# Patient Record
Sex: Male | Born: 1965 | Race: Black or African American | Hispanic: No | Marital: Married | Smoking: Never smoker
Health system: Southern US, Community
[De-identification: ages and names within clinical notes are randomized; demographics above are authoritative.]

## PROBLEM LIST (undated history)

## (undated) DIAGNOSIS — E039 Hypothyroidism, unspecified: Secondary | ICD-10-CM

## (undated) DIAGNOSIS — M545 Low back pain, unspecified: Secondary | ICD-10-CM

## (undated) DIAGNOSIS — T7840XA Allergy, unspecified, initial encounter: Secondary | ICD-10-CM

## (undated) DIAGNOSIS — C73 Malignant neoplasm of thyroid gland: Secondary | ICD-10-CM

## (undated) DIAGNOSIS — H409 Unspecified glaucoma: Secondary | ICD-10-CM

## (undated) DIAGNOSIS — K219 Gastro-esophageal reflux disease without esophagitis: Secondary | ICD-10-CM

## (undated) DIAGNOSIS — F329 Major depressive disorder, single episode, unspecified: Secondary | ICD-10-CM

## (undated) DIAGNOSIS — R339 Retention of urine, unspecified: Secondary | ICD-10-CM

## (undated) DIAGNOSIS — Z86010 Personal history of colonic polyps: Secondary | ICD-10-CM

## (undated) DIAGNOSIS — E785 Hyperlipidemia, unspecified: Secondary | ICD-10-CM

## (undated) DIAGNOSIS — L409 Psoriasis, unspecified: Secondary | ICD-10-CM

## (undated) DIAGNOSIS — K649 Unspecified hemorrhoids: Secondary | ICD-10-CM

## (undated) DIAGNOSIS — F419 Anxiety disorder, unspecified: Secondary | ICD-10-CM

## (undated) DIAGNOSIS — F32A Depression, unspecified: Secondary | ICD-10-CM

## (undated) HISTORY — DX: Hyperlipidemia, unspecified: E78.5

## (undated) HISTORY — DX: Major depressive disorder, single episode, unspecified: F32.9

## (undated) HISTORY — DX: Psoriasis, unspecified: L40.9

## (undated) HISTORY — DX: Malignant neoplasm of thyroid gland: C73

## (undated) HISTORY — DX: Personal history of colonic polyps: Z86.010

## (undated) HISTORY — DX: Retention of urine, unspecified: R33.9

## (undated) HISTORY — DX: Depression, unspecified: F32.A

## (undated) HISTORY — DX: Allergy, unspecified, initial encounter: T78.40XA

## (undated) HISTORY — DX: Unspecified glaucoma: H40.9

## (undated) HISTORY — DX: Gastro-esophageal reflux disease without esophagitis: K21.9

## (undated) HISTORY — DX: Anxiety disorder, unspecified: F41.9

## (undated) HISTORY — DX: Unspecified hemorrhoids: K64.9

## (undated) HISTORY — DX: Low back pain: M54.5

## (undated) HISTORY — DX: Hypothyroidism, unspecified: E03.9

## (undated) HISTORY — DX: Low back pain, unspecified: M54.50

---

## 2004-07-19 ENCOUNTER — Encounter: Admission: RE | Admit: 2004-07-19 | Discharge: 2004-07-19 | Payer: Self-pay | Admitting: Specialist

## 2004-11-26 ENCOUNTER — Emergency Department (HOSPITAL_COMMUNITY): Admission: EM | Admit: 2004-11-26 | Discharge: 2004-11-26 | Payer: Self-pay | Admitting: Emergency Medicine

## 2007-01-05 ENCOUNTER — Ambulatory Visit (HOSPITAL_COMMUNITY): Admission: RE | Admit: 2007-01-05 | Discharge: 2007-01-05 | Payer: Self-pay | Admitting: Chiropractic Medicine

## 2007-07-15 ENCOUNTER — Ambulatory Visit (HOSPITAL_COMMUNITY): Admission: RE | Admit: 2007-07-15 | Discharge: 2007-07-15 | Payer: Self-pay | Admitting: Orthopaedic Surgery

## 2007-07-17 HISTORY — PX: CERVICAL LAMINECTOMY: SHX94

## 2007-07-20 ENCOUNTER — Ambulatory Visit (HOSPITAL_COMMUNITY): Admission: RE | Admit: 2007-07-20 | Discharge: 2007-07-21 | Payer: Self-pay | Admitting: Orthopaedic Surgery

## 2009-03-15 ENCOUNTER — Encounter (HOSPITAL_COMMUNITY): Admission: RE | Admit: 2009-03-15 | Discharge: 2009-06-13 | Payer: Self-pay | Admitting: Internal Medicine

## 2009-03-16 HISTORY — PX: TOTAL THYROIDECTOMY: SHX2547

## 2009-03-23 ENCOUNTER — Encounter: Admission: RE | Admit: 2009-03-23 | Discharge: 2009-03-23 | Payer: Self-pay | Admitting: Internal Medicine

## 2009-04-18 ENCOUNTER — Encounter (INDEPENDENT_AMBULATORY_CARE_PROVIDER_SITE_OTHER): Payer: Self-pay | Admitting: Interventional Radiology

## 2009-04-18 ENCOUNTER — Encounter: Admission: RE | Admit: 2009-04-18 | Discharge: 2009-04-18 | Payer: Self-pay | Admitting: Internal Medicine

## 2009-04-18 ENCOUNTER — Other Ambulatory Visit: Admission: RE | Admit: 2009-04-18 | Discharge: 2009-04-18 | Payer: Self-pay | Admitting: Interventional Radiology

## 2009-06-16 ENCOUNTER — Ambulatory Visit (HOSPITAL_COMMUNITY): Admission: RE | Admit: 2009-06-16 | Discharge: 2009-06-17 | Payer: Self-pay | Admitting: Surgery

## 2009-06-16 ENCOUNTER — Encounter (INDEPENDENT_AMBULATORY_CARE_PROVIDER_SITE_OTHER): Payer: Self-pay | Admitting: Surgery

## 2010-01-05 ENCOUNTER — Encounter: Admission: RE | Admit: 2010-01-05 | Discharge: 2010-01-05 | Payer: Self-pay | Admitting: Internal Medicine

## 2010-07-02 ENCOUNTER — Encounter: Payer: Self-pay | Admitting: Internal Medicine

## 2010-07-02 ENCOUNTER — Encounter: Admission: RE | Admit: 2010-07-02 | Discharge: 2010-07-02 | Payer: Self-pay | Admitting: Internal Medicine

## 2010-07-02 LAB — CONVERTED CEMR LAB: TSH: 1.92 microintl units/mL

## 2010-09-12 ENCOUNTER — Ambulatory Visit: Payer: Self-pay | Admitting: Internal Medicine

## 2010-09-12 ENCOUNTER — Encounter: Payer: Self-pay | Admitting: Internal Medicine

## 2010-09-12 DIAGNOSIS — E039 Hypothyroidism, unspecified: Secondary | ICD-10-CM | POA: Insufficient documentation

## 2010-09-12 DIAGNOSIS — M545 Low back pain: Secondary | ICD-10-CM

## 2010-09-17 ENCOUNTER — Telehealth: Payer: Self-pay | Admitting: Internal Medicine

## 2010-09-17 ENCOUNTER — Ambulatory Visit: Payer: Self-pay | Admitting: Internal Medicine

## 2010-09-17 DIAGNOSIS — R339 Retention of urine, unspecified: Secondary | ICD-10-CM

## 2010-09-17 LAB — CONVERTED CEMR LAB
Bilirubin Urine: NEGATIVE
Hemoglobin, Urine: NEGATIVE
Ketones, ur: NEGATIVE mg/dL
Leukocytes, UA: NEGATIVE
Nitrite: NEGATIVE
Specific Gravity, Urine: 1.02 (ref 1.000–1.030)
Total Protein, Urine: NEGATIVE mg/dL
Urine Glucose: NEGATIVE mg/dL
Urobilinogen, UA: 0.2 (ref 0.0–1.0)
pH: 7 (ref 5.0–8.0)

## 2010-09-20 ENCOUNTER — Ambulatory Visit (HOSPITAL_COMMUNITY): Admission: RE | Admit: 2010-09-20 | Discharge: 2010-09-20 | Payer: Self-pay | Admitting: Internal Medicine

## 2010-09-26 ENCOUNTER — Encounter: Payer: Self-pay | Admitting: Internal Medicine

## 2010-10-01 ENCOUNTER — Encounter: Payer: Self-pay | Admitting: Internal Medicine

## 2010-10-03 ENCOUNTER — Telehealth: Payer: Self-pay | Admitting: Internal Medicine

## 2010-10-03 ENCOUNTER — Encounter (INDEPENDENT_AMBULATORY_CARE_PROVIDER_SITE_OTHER): Payer: Self-pay | Admitting: *Deleted

## 2010-12-03 ENCOUNTER — Encounter
Admission: RE | Admit: 2010-12-03 | Discharge: 2010-12-03 | Payer: Self-pay | Source: Home / Self Care | Attending: Internal Medicine | Admitting: Internal Medicine

## 2010-12-03 ENCOUNTER — Encounter: Payer: Self-pay | Admitting: Internal Medicine

## 2010-12-03 LAB — CONVERTED CEMR LAB
BUN: 14 mg/dL
CO2: 32 meq/L
Chloride: 107 meq/L
Creatinine, Ser: 0.95 mg/dL
Glucose, Bld: 89 mg/dL
Potassium: 4.9 meq/L
Sodium: 142 meq/L
TSH: 1.41 microintl units/mL

## 2010-12-05 ENCOUNTER — Encounter: Payer: Self-pay | Admitting: Internal Medicine

## 2010-12-19 ENCOUNTER — Encounter: Payer: Self-pay | Admitting: Internal Medicine

## 2011-01-06 ENCOUNTER — Encounter: Payer: Self-pay | Admitting: Internal Medicine

## 2011-01-16 NOTE — Assessment & Plan Note (Signed)
Summary: back pain/cd   Vital Signs:  Patient profile:   45 year old male Height:      76 inches (193.04 cm) Weight:      198 pounds (90.00 kg) O2 Sat:      97 % on Room air Temp:     97.6 degrees F (36.44 degrees C) oral Pulse rate:   72 / minute BP sitting:   100 / 68  (left arm) Cuff size:   regular  Vitals Entered By: Orlan Leavens RMA (September 17, 2010 10:01 AM)  O2 Flow:  Room air CC: Back pain Is Patient Diabetic? No Pain Assessment Patient in pain? yes     Location: lower back Type: aching   Primary Care Provider:  Newt Lukes MD  CC:  Back pain.  History of Present Illness:  c/o continued upper mid back pain onset 48h ago - hx same last week but >90% improved with pred pak precipitated initially by twisting - this spell precipitated by vaccumming when feeling better instant onset of severe pain in mid back - radiates equally around both sides to front of abd, just above navel area - assoc with numb sensation down r>l groin area but no numbness or shooting pains into buttocks or legs no weakness or trouble walking but trouble standing up due to pain pain exac by change in position: moving to sit or stand but releived once in position +hx similar pain - episodic flares on/off over past few years same area but never this severe currently pain 8/10 denies n/v or change in bowels - no incontinence or fever but trouble passing urine x 24h min relief of pain with hydrocodone; good relief when on pred pak and after tordol shot last week  Current Medications (verified): 1)  Travatan Z 0.004 % Soln (Travoprost) .Marland Kitchen.. 1 Drop Each Eye At Bedtime 2)  Cosopt 22.3-6.8 Mg/ml Soln (Dorzolamide Hcl-Timolol Mal) .Marland Kitchen.. 1 Drop Each Eay Two Times A Day 3)  Levothyroxine Sodium 125 Mcg Tabs (Levothyroxine Sodium) .Marland Kitchen.. 1 By Mouth Once Daily 4)  Methocarbamol 500 Mg Tabs (Methocarbamol) .Marland Kitchen.. 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    Physical Exam  General:  thin, alert, well-developed, well-nourished, and cooperative to examination.   nontoxic but uncomfortable Lungs:  normal respiratory effort, no intercostal retractions or use of accessory muscles; normal breath sounds bilaterally - no crackles and no wheezes.    Heart:  normal rate, regular rhythm, no murmur, and no rub. BLE without edema.  Abdomen:  soft, non-tender, normal bowel sounds, no distention; no masses and no appreciable hepatomegaly or splenomegaly.  no suprapubic fullness Msk:  back: full range of motion of lumbar spine but limited extension due to pain. Nontender to palpation over vertebra. Deep tendon reflexes symmetrically intact at Achilles and patella, negative clonus. Sensation intact throughout all dermatomes in bilateral lower extremities but c/o numbness over L>R groin. Full strength to manual muscle testing in all major muscle groups. Able to heel and toe walk without difficulty and ambulates  with a normal gait but bend forward at waist due to pain.    Impression & Recommendations:  Problem # 1:  LOW BACK PAIN (ICD-724.2)  His updated medication list for this problem includes:    Methocarbamol 500 Mg Tabs (Methocarbamol) .Marland Kitchen... 1 by mouth three times a day as needed for pain and spasm  concern for high disc with central rupture given acute onset severe pain - improved on pred, now returned - long hx same (intermittent symptoms >12 months) numbness and pain in L1-2 distribution (mid abd and  groin) reviewe xray l -spine from last week to look for DDD -  loss of disc height noted L1 cont to tx with antiinflam and muscle relaxants - 12 d pred pak and robaxin erx done also tordol for acute pain now - shot done order MRI T12-L spine depending given sym[ptoms and response to conserv tx given severity of pain and intermit long duration of pain also refer to nsurg for further eval and tx as needed   Orders: Radiology Referral (Radiology) Ketorolac-Toradol 15mg  [phone]) Admin of Therapeutic Inj  intramuscular or subcutaneous (14782) Neurosurgeon Referral (Neurosurgeon) Prescription Created Electronically [phone])  Complete Medication List: 1)  Travatan Z 0.004 % Soln (Travoprost) .Marland Kitchen.. 1 drop each eye at bedtime 2)  Cosopt 22.3-6.8 Mg/ml Soln (Dorzolamide hcl-timolol mal) .Marland Kitchen.. 1 drop each eay two times a day 3)  Levothyroxine Sodium 125 Mcg Tabs (Levothyroxine sodium) .Marland Kitchen.. 1 by mouth once daily 4)  Methocarbamol 500 Mg Tabs (Methocarbamol) .Marland Kitchen.. 1 by mouth three times a day as needed for pain and spasm 5)  Prednisone (pak) 10 Mg Tabs (Prednisone) .... As directed x 12 days  Other Orders: TLB-Udip w/ Micro (81001-URINE)  Patient Instructions: 1)  it was good to see you today. 2)  urine test(s) ordered today - your results will called to you after review 3)  MRI of back ordered today - Our office will contact you regarding this appointment once made.  4)  also we'll make referral to neurosurg for further evaluation and treatment as needed. Our office will contact you regarding this appointment once made.  5)  continue to use pred pak and muscle relaxant as discussed for mechanical strain causing pain - your prescriptions have been electronically submitted to your pharmacy. Please take as directed. Contact our office if you believe you're having problems with the medication(s).  6)  work note as needed - let us know Prescriptions: METHOCARBAMOL 500 MG TABS (METHOCARBAMOL) 1 by mouth  three times a day as needed for pain and spasm  #40 x 0   Entered and Authorized by:   Newt Lukes MD   Signed by:   Newt Lukes MD on 09/17/2010   Method used:   Electronically to        [facility] [phone]* (retail)       [address]       [place], [place]  [address]       Ph: [phone]       Fax: [phone]   RxID:   [id number] PREDNISONE (PAK) 10 MG TABS (PREDNISONE) as directed x 12 days  #12 x 0   Entered and Authorized by:   Newt Lukes MD   Signed by:   Newt Lukes MD on 09/17/2010   Method used:   Electronically to        [facility]  [address] [phone]* (retail)       [address]  [place], [place]  [address]       Ph: [phone]       Fax: [phone]   RxID:   [id number]    Medication Administration  Injection # 1:    Medication: Ketorolac-Toradol 15mg     Diagnosis: LOW BACK PAIN (ICD-724.2)    Route: IM    Site: L deltoid    Exp Date: 01/16/2011    Lot #: [id number]    Mfr: [email or web address]    Comments: Gave total of 60mg     Patient tolerated injection without complications    Given by: Orlan Leavens RMA (September 17, 2010 10:35 AM)  Orders Added: 1)  TLB-Udip w/ Micro [81001-URINE] 2)  Radiology Referral [Radiology] 3)  Ketorolac-Toradol 15mg  [J1885] 4)  Admin of Therapeutic Inj  intramuscular or subcutaneous [96[address]] 5)  Neurosurgeon Referral [Neurosurgeon] 6)  Est. Patient Level IV [84166] 7)  Prescription Created Electronically [phone]

## 2011-01-16 NOTE — Consult Note (Signed)
[facility]: [facility]   Imported By: Sherian Rein 10/09/2010 09:28:18  _____________________________________________________________________  External Attachment:    Type:   Image     Comment:   External Document

## 2011-01-16 NOTE — Assessment & Plan Note (Signed)
Summary: NEW/BCBS/NWS   Vital Signs:  Patient profile:   45 year old male Height:      76 inches Weight:      198.25 pounds BMI:     24.22 O2 Sat:      97 % on Room air Temp:     97.1 degrees F oral Pulse rate:   60 / minute BP sitting:   118 / 64  (left arm) Cuff size:   regular  Vitals Entered By: Margaret Pyle, CMA (September 12, 2010 9:46 AM)  O2 Flow:  Room air CC: Lower back pain radiating to navel Is Patient Diabetic? No Pain Assessment Patient in pain? yes     Location: lower back Intensity: 6 Type: heaviness Onset of pain  Sunday evening 09/09/2010   Primary Care Provider:  Newt Lukes MD  CC:  Lower back pain radiating to navel.  History of Present Illness: new pt to me and our practice, here to est care  c/o back pain onset 48h ago precipitated by twisting  instant onset of severe pain in mid back - radiates equally around both sides to front of abd, just above navel area - assoc with numb sensation down r>l groin area but no numbness or shooting pains into buttocks or legs no weakness or trouble walking but trouble standing up staright due to pain pain exac by change in position: moving to sit or stand but releived once in position +hx similar pain - episodic flares on/off over past few years same area but never this severe currently pain 7-8/10 denies n/v or change in bowels - no incontinence or fever min relief of pain with hydrocodone  Allergies: No Known Drug Allergies  Past History:  Past Medical History: Hypothyroidism, 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    Physical Exam  General:  thin, alert, well-developed, well-nourished, and cooperative to examination.   nontoxic but uncomfortable Lungs:  normal respiratory effort, no intercostal retractions or use of accessory muscles; normal breath sounds bilaterally - no crackles and no wheezes.    Heart:  normal rate, regular rhythm, no murmur, and no rub. BLE without edema.  Abdomen:  soft, non-tender, normal bowel sounds, no distention; no masses and no appreciable hepatomegaly or splenomegaly.   Msk:  back: full range of motion of lumbar spine but limited extension due to pain. Nontender to palpation over vertebra. Deep tendon reflexes symmetrically intact at Achilles and patella, negative clonus. Sensation intact throughout all dermatomes in bilateral lower extremities but c/o numbness over L>R groin. Full strength to manual muscle testing in all major muscle groups. Able to heel and toe walk without difficulty and ambulates with a normal gait but bend forward at waist due to pain.  Neurologic:  alert & oriented X3 and cranial nerves II-XII symetrically intact.  strength normal in all extremities, sensation intact to light touch, and gait normal. speech fluent without dysarthria or aphasia; follows commands with good comprehension.    Impression & Recommendations:  Problem # 1:  LOW BACK PAIN (ICD-724.2) concern for high disc with central rupture given acute onset  severe pain - numbness and pain in L1-2 distribution (mid abd and groin) check xray l -spine to look for DDD - tx with antiinflam and muscle relaxants - pred pak and robaxin erx done also tordol for acute pain now - shot done consider need for MRI T12-L spine depending on response to conserv tx given severity of pain and intermit long duration of pain   His updated medication list for this problem includes:     Methocarbamol 500 Mg Tabs (Methocarbamol) .Marland Kitchen... 1 by mouth three times a day as needed for pain and spasm  Orders: Admin of Therapeutic Inj  intramuscular or subcutaneous ([address]) Ketorolac-Toradol 15mg  (U0454) Ketorolac-Toradol 15mg  (U9811) T-Lumbar Spine 2 Views (72100TC) Prescription Created Electronically [phone])  Complete Medication List: 1)  Travatan Z 0.004 % Soln (Travoprost) .Marland Kitchen.. 1 drop each eye at bedtime 2)  Cosopt 22.3-6.8 Mg/ml Soln (Dorzolamide hcl-timolol mal) .Marland Kitchen.. 1 drop each eay two times a day 3)  Levothyroxine Sodium 125 Mcg Tabs (Levothyroxine sodium) .Marland Kitchen.. 1 by mouth once daily 4)  Prednisone (pak) 10 Mg Tabs (Prednisone) .... As directed x 6 days 5)  Methocarbamol 500 Mg Tabs (Methocarbamol) .Marland Kitchen.. 1 by mouth three times a day as needed for pain and spasm  Other Orders: Admin 1st Vaccine (29562) Flu Vaccine 56yrs + (13086) Flu Vaccine Consent Questions     Do you have a history of severe allergic reactions to this vaccine? no    Any prior history of allergic reactions to egg and/or gelatin? no    Do you have a sensitivity to the preservative Thimersol? no    Do you have a past history of Guillan-Barre Syndrome? no    Do you currently have an acute febrile illness? no    Have you ever had a severe reaction to latex? no    Vaccine information given and explained to patient? yes    Are you currently pregnant? no    Lot Number:[id number]   Exp Date:06/15/2011   Site Given  Right Deltoid IM1st Vaccine (57846) Flu Vaccine 1yrs + ([address])  Patient Instructions: 1)  it was good to see you today. 2)  tordol shot given today for pain - also flu shot 3)  xrays of back ordered today - your results will be called to you after review  4)  use pred pak and muscle relaxant as discussed for mechanical strain causing pain - your prescriptions have been electronically submitted to your pharmacy. Please take as directed. Contact our office if you believe you're having problems with  the medication(s).  5)  work note for yesterday through Friday - rest and take it easy 6)  use ice or heating pad to sore area 3x/day for each time and as needed  7)  if symptoms not imrpoved or if worse in next 7-10days, call for further evauation/treatmentl as needed  Prescriptions: METHOCARBAMOL 500 MG TABS (METHOCARBAMOL) 1 by mouth three times a day as needed for pain and spasm  #40 x 0   Entered and Authorized by:   Newt Lukes MD   Signed by:   Newt Lukes MD on 09/12/2010   Method used:   Electronically to        [facility] [phone]* (retail)       [address]       [place], [place]  [address]       Ph: [phone]       Fax: [phone]   RxID:   [id number]  PREDNISONE (PAK) 10 MG TABS (PREDNISONE) as directed x 6 days  #1 x 0   Entered and Authorized by:   Newt Lukes MD   Signed by:   Newt Lukes MD on 09/12/2010   Method used:   Electronically to        [facility] [phone]* (retail)       [address]       [place], [place]  [address]       Ph: [phone]       Fax: [phone]   RxID:   [id number]  .lbflu   Medication Administration  Injection # 1:    Medication: Ketorolac-Toradol 15mg     Diagnosis: LOW BACK PAIN (ICD-724.2)    Route: IM    Site: R deltoid    Exp Date: 01/16/2011    Lot #: [id number]    Mfr: HOSPIRA    Patient tolerated injection without complications    Given by: Margaret Pyle, CMA (September 12, 2010 10:31 AM)  Injection # 2:    Medication: Ketorolac-Toradol 15mg     Diagnosis: LOW BACK PAIN (ICD-724.2)    Route: IM    Site: R deltoid    Exp Date: 01/16/2011    Lot #: [id number]    Mfr: HOSPIRA    Patient tolerated injection without complications    Given by: Margaret Pyle, CMA (September 12, 2010 10:31 AM)  Orders Added: 1)  Admin 1st Vaccine [90471] 2)  Flu Vaccine 33yrs + [59563] 3)  Admin of Therapeutic Inj  intramuscular or subcutaneous [96[address]] 4)   Ketorolac-Toradol 15mg  [J1885] 5)  Ketorolac-Toradol 15mg  [J1885] 6)  T-Lumbar Spine 2 Views [72100TC] 7)  New Patient Level III [87564] 8)  Prescription Created Electronically [phone]

## 2011-01-16 NOTE — Letter (Signed)
Summary: [facility]   Imported By: Lennie Odor 10/08/2010 11:19:36  _____________________________________________________________________  External Attachment:    Type:   Image     Comment:   External Document

## 2011-01-16 NOTE — Letter (Signed)
Summary: Out of Work  [facility]  [address] [place], [place] [address]   Phone: [phone]  Fax: [phone]    October 03, 2010   Employee:  [employer] [facility] [employer] [facility] [employer]    To Whom It May Concern:   For Medical reasons, please excuse the above named employee from work for the following dates:  Start: 09/12/10    End: 10/07/10, May return to normal work schedule on Monday 10/08/10    If you need additional information, please feel free to contact our office.         Sincerely,    Dr. Rene Paci

## 2011-01-16 NOTE — Progress Notes (Signed)
Summary: PHARM CLARIFICATION  Phone Note From Pharmacy   Summary of Call: Gave pharm clarification of prednisone dose pak. They said for 12 days qty must be #48. Gave verbal ok and updated EMR.  Initial call taken by: Lamar Sprinkles, CMA,  September 17, 2010 10:54 AM  Follow-up for Phone Call        ok - thanks Follow-up by: Newt Lukes MD,  September 17, 2010 10:55 AM    Prescriptions: PREDNISONE (PAK) 10 MG TABS (PREDNISONE) as directed x 12 days  #48 x 0   Entered by:   Lamar Sprinkles, CMA   Authorized by:   Newt Lukes MD   Signed by:   Lamar Sprinkles, CMA on 09/17/2010   Method used:   Historical   RxID:   [id number]

## 2011-01-16 NOTE — Progress Notes (Signed)
Summary: Follow up appt?  Phone Note Call from Patient Call back at [facility] Phone [phone]   Caller: Patient Summary of Call: Pt was seen by Dr Danielle Dess yesterday and had bilateral cortisone injections to lower back. Pt's pain has since started to improve. Should pt followup with [facility] or [facility] for work clearance? Please advise. Initial call taken by: Margaret Pyle, CMA,  October 03, 2010 10:26 AM  Follow-up for Phone Call        spoke with susan at [facility] - pt is clear from their standpoint to return to work any time after Friday 10/22 (ex: on Monday 10/24 or Sun night before depending on shift) - may generate note for same as needed and i will sign - thanks Follow-up by: Newt Lukes MD,  October 03, 2010 10:58 AM  Additional Follow-up for Phone Call Additional follow up Details #1::        Letter generated. Notified pt ready for pick-up Additional Follow-up by: Orlan Leavens RMA,  October 03, 2010 11:10 AM

## 2011-01-16 NOTE — Letter (Signed)
Summary: [facility]  [address] [place], [place] [address]   Phone: [phone]  Fax: [phone]    Today's Date: September 12, 2010  Name of Patient: Elijah Ashley [facility]  The above named patient had a medical visit today at:  930 am.  Please take this into consideration when reviewing the time away from work/school.    Special Instructions:  [  ] None  [  ] To be off the remainder of today and this week through Friday 09/14/10, may return to the normal work schedule Monday 09/17/10  [  ] To be off until the next scheduled appointment on ______________________.  [  ] Other ________________________________________________________________ ________________________________________________________________________   Sincerely yours,    Rene Paci MD

## 2011-01-17 NOTE — Letter (Signed)
Summary: [facility]   Imported ByLennie Odor 12/24/2010 09:37:15  _____________________________________________________________________  External Attachment:    Type:   Image     Comment:   External Document

## 2011-02-22 ENCOUNTER — Other Ambulatory Visit (HOSPITAL_COMMUNITY): Payer: Self-pay | Admitting: Internal Medicine

## 2011-02-22 ENCOUNTER — Other Ambulatory Visit (HOSPITAL_COMMUNITY): Payer: Self-pay | Admitting: Surgery

## 2011-02-22 ENCOUNTER — Ambulatory Visit (HOSPITAL_COMMUNITY)
Admission: RE | Admit: 2011-02-22 | Discharge: 2011-02-22 | Disposition: A | Discharge status: Home / Self Care | Payer: BC Managed Care – PPO | Source: Ambulatory Visit | Attending: Internal Medicine | Admitting: Internal Medicine

## 2011-02-22 DIAGNOSIS — C73 Malignant neoplasm of thyroid gland: Secondary | ICD-10-CM

## 2011-02-22 DIAGNOSIS — R946 Abnormal results of thyroid function studies: Secondary | ICD-10-CM | POA: Insufficient documentation

## 2011-02-22 MED ORDER — SODIUM IODIDE I 131 CAPSULE
4.0000 | Freq: Once | INTRAVENOUS | Status: AC | PRN
  Administered 2011-02-22: 4 via ORAL

## 2011-02-23 ENCOUNTER — Ambulatory Visit (HOSPITAL_COMMUNITY): Payer: Self-pay

## 2011-02-24 ENCOUNTER — Ambulatory Visit (HOSPITAL_COMMUNITY): Payer: Self-pay

## 2011-02-26 ENCOUNTER — Encounter (HOSPITAL_COMMUNITY): Payer: Self-pay

## 2011-03-24 LAB — CALCIUM
Calcium: 8.4 mg/dL (ref 8.4–10.5)
Calcium: 8.8 mg/dL (ref 8.4–10.5)

## 2011-03-25 LAB — COMPREHENSIVE METABOLIC PANEL
ALT: 34 U/L (ref 0–53)
AST: 26 U/L (ref 0–37)
Albumin: 4 g/dL (ref 3.5–5.2)
Alkaline Phosphatase: 369 U/L — ABNORMAL HIGH (ref 39–117)
BUN: 8 mg/dL (ref 6–23)
CO2: 29 mEq/L (ref 19–32)
Calcium: 9.8 mg/dL (ref 8.4–10.5)
Chloride: 107 mEq/L (ref 96–112)
Creatinine, Ser: 0.73 mg/dL (ref 0.4–1.5)
GFR calc Af Amer: >60 mL/min (ref >60)
GFR calc non Af Amer: >60 mL/min (ref >60)
Glucose, Bld: 77 mg/dL (ref 70–99)
Potassium: 4.6 mEq/L (ref 3.5–5.1)
Sodium: 140 mEq/L (ref 135–145)
Total Bilirubin: 0.9 mg/dL (ref 0.3–1.2)
Total Protein: 6.6 g/dL (ref 6.0–8.3)

## 2011-03-25 LAB — CBC
HCT: 44.9 % (ref 39.0–52.0)
Hemoglobin: 14.9 g/dL (ref 13.0–17.0)
MCHC: 33.2 g/dL (ref 30.0–36.0)
MCV: 84.5 fL (ref 78.0–100.0)
Platelets: 143 10*3/uL — ABNORMAL LOW (ref 150–400)
RBC: 5.32 MIL/uL (ref 4.22–5.81)
RDW: 14.1 % (ref 11.5–15.5)
WBC: 7.1 10*3/uL (ref 4.0–10.5)

## 2011-03-25 LAB — URINALYSIS, ROUTINE W REFLEX MICROSCOPIC
Bilirubin Urine: NEGATIVE
Hgb urine dipstick: NEGATIVE
Ketones, ur: 15 mg/dL — AB
Specific Gravity, Urine: 1.028 (ref 1.005–1.030)
pH: 6.5 (ref 5.0–8.0)

## 2011-03-25 LAB — DIFFERENTIAL
Basophils Absolute: 0 10*3/uL (ref 0.0–0.1)
Basophils Relative: 1 % (ref 0–1)
Eosinophils Absolute: 0.2 10*3/uL (ref 0.0–0.7)
Eosinophils Relative: 2 % (ref 0–5)
Lymphocytes Relative: 39 % (ref 12–46)
Lymphs Abs: 2.7 10*3/uL (ref 0.7–4.0)
Monocytes Absolute: 0.6 10*3/uL (ref 0.1–1.0)
Monocytes Relative: 9 % (ref 3–12)
Neutro Abs: 3.5 10*3/uL (ref 1.7–7.7)
Neutrophils Relative %: 50 % (ref 43–77)

## 2011-03-25 LAB — PROTIME-INR
INR: 1 (ref 0.00–1.49)
Prothrombin Time: 13.7 seconds (ref 11.6–15.2)

## 2011-04-26 ENCOUNTER — Other Ambulatory Visit (HOSPITAL_COMMUNITY): Payer: Self-pay | Admitting: Internal Medicine

## 2011-04-26 DIAGNOSIS — C73 Malignant neoplasm of thyroid gland: Secondary | ICD-10-CM

## 2011-04-30 NOTE — Op Note (Signed)
NAMEKISHAN, WACHSMUTH            ACCOUNT NO.:  [id number]   MEDICAL RECORD NO.:  [id number]          PATIENT TYPE:  OIB   LOCATION:  5038                         FACILITY:  [facility]   PHYSICIAN:  Mark C. Ophelia Charter, M.D.    DATE OF BIRTH:  [birth year]   DATE OF PROCEDURE:  07/20/2007  DATE OF DISCHARGE:                               OPERATIVE REPORT   PREOPERATIVE DIAGNOSIS:  C5-6 anterior cervical diskectomy and fusion,  removal of free fragment, allograft and plate.   POSTOPERATIVE DIAGNOSIS:  C5-6 anterior cervical diskectomy and fusion,  removal of free fragment, allograft and plate.   SURGEON:  Annell Greening, MD   ASSISTANT:  Maud Deed, PA-C   ANESTHESIA:  GOT.   DRAINS:  One Hemovac.   ESTIMATED BLOOD LOSS:  Minimal.   PROCEDURE:  After induction of general anesthesia, standard prepping and  draping was formed with head halter traction, a sterile Mayo stand of  the head.  A sterile skin marker and Betadine Vi-Drape was applied.  Incision was made starting at the midline and extending to the left.  Platysma was split in line with the fibers  Blunt dissection down to the  level of the longus coli with carotid sheath contents lateral was  performed and the C-arm was draped and brought in and lateral position  confirmed that this was the C6 interspace.  Dissection above the  omohyoid was performed, once it was identified, and C5-6 level  appropriately located with a short 25 needle, straight clamp and cross-  table lateral C-arm.  Scalp was opened.  A micropituitary was used to  remove the disk.  Cloward curettes were used.  Operative microscope was  draped and brought in and the posterior longitudinal ligament and  posterior spurs were removed.  Once the ligament was removed,  immediately a disk fragment was noted; it was gently teased out using a  Black nerve hook, a longer nerve hook and micropituitary.  After the  piece was removed, the curettes cleaned both gutters.  The 6-  and 7-mm  trials were checked and 7-mm interbody was placed with head halter  traction applied by the C.R.N.A.  Some DBX bone putty was packed in the  middle of the graft and a 7-mm lordotic graft was selected.  There was  room on each side of the graft for egress of fluid.  A 16-mm plate was  selected, checked under AP and lateral fluoroscopy and 14-mm screws were  placed.  After inspection with all screws down  tightly and locked with VueLock plate, Hemovac was placed through a  separate stab incision with an in-and-out technique.  Platysma was  closed with 3-0 Vicryl, 4-0 Vicryl subcuticular closure, Tincture of  Benzoin and Steri-Strips, Marcaine, postop dressing and soft cervical  collar.      Mark C. Ophelia Charter, M.D.  Electronically Signed     MCY/MEDQ  D:  07/20/2007  T:  07/21/2007  Job:  [id number]

## 2011-04-30 NOTE — Op Note (Signed)
Elijah Ashley, Elijah Ashley            ACCOUNT NO.:  [id number]   MEDICAL RECORD NO.:  [id number]          PATIENT TYPE:  OIB   LOCATION:  5118                         FACILITY:  [facility]   PHYSICIAN:  Velora Heckler, MD      DATE OF BIRTH:  1966/06/21   DATE OF PROCEDURE:  06/16/2009  DATE OF DISCHARGE:                               OPERATIVE REPORT   PREOPERATIVE DIAGNOSES:  1. Graves disease (hyperthyroidism).  2. Left inferior thyroid nodule, 1.3 cm, with cytologic atypia.   POSTOPERATIVE DIAGNOSES:  1. Graves disease (hyperthyroidism).  2. Left inferior thyroid nodule, 1.3 cm, with cytologic atypia.   PROCEDURE:  Total thyroidectomy.   SURGEON:  Velora Heckler, MD, FACS   ASSISTANT:  Magnus Ivan, RNFA   ANESTHESIA:  General per Dr. Bedelia Person.   ESTIMATED BLOOD LOSS:  Minimal.   PREPARATION:  Chloraprep.   COMPLICATIONS:  None.   INDICATIONS:  This patient is a 45 year old black male, native of  [place], now from [place], [place].  The patient presented in  the Winter of 2009 with hyperthyroidism.  The patient experienced  approximately a 35 pounds weight loss.  He was referred to Dr. Talmage Coin.  The patient was started on propranolol and methimazole.  Thyroid  ultrasound showed a 1.3 cm nodule in the left thyroid lobe.  On nuclear  medicine scanning, this was a cold nodule.  Fine needle aspiration  showed a follicular lesion with nuclear changes including nuclear  grooves and a possibility of a follicular variant papillary thyroid  carcinoma was discussed.  The patient is now referred for consideration  for total thyroidectomy for management of Graves disease and for  definitive diagnosis of his left thyroid nodule.   BODY OF REPORT:  Procedure was done in the OR #16 at the [facility].  The patient was brought to the operating room and  placed in a supine position on the operating room table.  Following  administration of general  anesthesia, the patient is positioned and then  prepped and draped in the usual strict aseptic fashion.  After  ascertaining that an adequate level of anesthesia been achieved, a  Kocher incision was made with a #15 blade.  Dissection was carried  through subcutaneous tissues and platysma.  Skin flaps were elevated  cephalad and caudad from the thyroid notch to the sternal notch.  A  Mahorner self-retaining retractor was placed for exposure.  Strap  muscles are incised in the midline.  Dissection was begun on the left  side of the neck.  Left thyroid lobe was moderately enlarged.  It does  contain a nodular density in the inferior pole.  Gland was gently  mobilized.  Middle thyroid vein is divided between medium Ligaclips with  the harmonic scalpel.  Other venous tributaries were divided between  small and medium Ligaclips with the harmonic scalpel.  Superior pole was  gently dissected out.  Vessels were divided individually between  Ligaclips with the harmonic scalpel.  Parathyroid tissue was identified  and preserved on its vascular pedicle.  Gland was rolled anteriorly.  Branches of the inferior thyroid artery are divided between small  Ligaclips.  Inferior venous tributaries are divided between medium  Ligaclips with the harmonic scalpel.  There was a moderately enlarged  tubercle of Zuckerkandl, which is dissected out carefully.  Inferior  parathyroid gland was identified and preserved.  Recurrent laryngeal  nerve was identified and preserved.  The ligament of Allyson Sabal was  transected with the electrocautery.  A small remnant of thyroid tissue  was left on the left side immediately adjacent to the insertion point of  the nerve.  Remainder of the ligament of Allyson Sabal is transected and the  gland was mobilized up and onto the anterior trachea.  There was a small  pyramidal lobe, which was excised en bloc with the isthmus using the  electrocautery for hemostasis.  Isthmus was mobilized across  the  midline.  A dry pack is placed in the left neck.   Next, we turned our attention to the right thyroid lobe.  Right thyroid  lobe was also moderately enlarged.  No discrete nodules or masses were  identified.  Middle thyroid vein is relatively large and was divided  between medium Ligaclips with the harmonic scalpel.  Superior pole  vessels are taken down individually between medium Ligaclips using the  harmonic scalpel.  Gland was rolled anteriorly.  Inferior venous  tributaries were divided between medium Ligaclips with the harmonic  scalpel.  Branches of the inferior thyroid artery are divided between  small Ligaclips.  Parathyroid tissue was identified and preserved.  Again, there is an enlarged tubercle of Zuckerkandl, which requires  meticulous dissection in order to mobilize and excise in its entirety.  Recurrent nerve was immediately posterior to the tubercle and is  preserved.  Branches of the inferior thyroid artery are divided between  small Ligaclips.  Ligament of Allyson Sabal was transected with the  electrocautery and the gland was mobilized onto the trachea.  The  remainder of the thyroid tissue was excised off the anterior trachea  with the electrocautery.  Suture was used to mark the left superior pole  of the thyroid gland.  The entire gland is submitted to pathology for  review.   Neck was irrigated with warm saline and good hemostasis obtained with  the electrocautery.  Surgicel was placed in the operative field  bilaterally.  Strap muscles were reapproximated in the midline with  interrupted 3-0 Vicryl sutures.  Platysma was closed with interrupted 3-  0 Vicryl sutures.  Skin was closed with running 4-0 Monocryl  subcuticular suture.  Wound was washed and dried.  Benzoin and Steri-  Strips were applied.  Sterile dressings were applied.  The patient is  awakened from anesthesia and brought to the recovery room in stable  condition.  The patient tolerated the procedure  well.      Velora Heckler, MD  Electronically Signed     TMG/MEDQ  D:  06/16/2009  T:  06/17/2009  Job:  [id number]   cc:   Tonita Cong, M.D.

## 2011-05-27 ENCOUNTER — Encounter (HOSPITAL_COMMUNITY)
Admission: RE | Admit: 2011-05-27 | Discharge: 2011-05-27 | Disposition: A | Discharge status: Home / Self Care | Payer: BC Managed Care – PPO | Source: Ambulatory Visit | Attending: Internal Medicine | Admitting: Internal Medicine

## 2011-05-27 DIAGNOSIS — C73 Malignant neoplasm of thyroid gland: Secondary | ICD-10-CM

## 2011-05-28 ENCOUNTER — Encounter (HOSPITAL_COMMUNITY)
Admission: RE | Admit: 2011-05-28 | Discharge: 2011-05-28 | Disposition: A | Discharge status: Home / Self Care | Payer: BC Managed Care – PPO | Source: Ambulatory Visit | Attending: Internal Medicine | Admitting: Internal Medicine

## 2011-05-29 ENCOUNTER — Encounter (HOSPITAL_COMMUNITY)
Admission: RE | Admit: 2011-05-29 | Discharge: 2011-05-29 | Disposition: A | Discharge status: Home / Self Care | Payer: BC Managed Care – PPO | Source: Ambulatory Visit | Attending: Internal Medicine | Admitting: Internal Medicine

## 2011-05-29 DIAGNOSIS — C73 Malignant neoplasm of thyroid gland: Secondary | ICD-10-CM | POA: Insufficient documentation

## 2011-05-29 MED ORDER — SODIUM IODIDE I 131 CAPSULE
99.1000 | Freq: Once | INTRAVENOUS | Status: AC | PRN
  Administered 2011-05-29: 99.1 via ORAL

## 2011-06-03 MED ORDER — SODIUM IODIDE I 131 CAPSULE
100.0000 | Freq: Once | INTRAVENOUS | Status: AC | PRN
  Administered 2011-06-03: 100 via ORAL

## 2011-06-05 ENCOUNTER — Telehealth: Payer: Self-pay

## 2011-06-05 DIAGNOSIS — Z0000 Encounter for general adult medical examination without abnormal findings: Secondary | ICD-10-CM

## 2011-06-05 NOTE — Telephone Encounter (Signed)
Orders faxed, pt advised

## 2011-06-05 NOTE — Telephone Encounter (Signed)
Pt called stating he has labs done at [facility] today and is requesting order to add CPX labs for planned upcoming physical, please advise Fax Alfonse Spruce [phone]

## 2011-06-05 NOTE — Telephone Encounter (Signed)
Yes - ok to add on CPX labs (v70.0) - thanks

## 2011-06-06 ENCOUNTER — Telehealth: Payer: Self-pay

## 2011-06-06 ENCOUNTER — Other Ambulatory Visit (HOSPITAL_COMMUNITY): Payer: Self-pay | Admitting: Internal Medicine

## 2011-06-06 DIAGNOSIS — C73 Malignant neoplasm of thyroid gland: Secondary | ICD-10-CM

## 2011-06-06 NOTE — Telephone Encounter (Signed)
[facility] faxed to inform MD that CBC with Diff was not added because correct tube was not drawn.

## 2011-06-07 ENCOUNTER — Encounter (HOSPITAL_COMMUNITY)
Admission: RE | Admit: 2011-06-07 | Discharge: 2011-06-07 | Disposition: A | Discharge status: Home / Self Care | Payer: BC Managed Care – PPO | Source: Ambulatory Visit | Attending: Internal Medicine | Admitting: Internal Medicine

## 2011-06-07 DIAGNOSIS — C73 Malignant neoplasm of thyroid gland: Secondary | ICD-10-CM | POA: Insufficient documentation

## 2011-08-29 ENCOUNTER — Encounter: Payer: Self-pay | Admitting: Internal Medicine

## 2011-08-29 ENCOUNTER — Telehealth: Payer: Self-pay | Admitting: *Deleted

## 2011-08-29 DIAGNOSIS — Z0000 Encounter for general adult medical examination without abnormal findings: Secondary | ICD-10-CM

## 2011-08-29 DIAGNOSIS — Z125 Encounter for screening for malignant neoplasm of prostate: Secondary | ICD-10-CM

## 2011-08-29 NOTE — Telephone Encounter (Signed)
Received staff msg pt schedule cpx for October need labs entered. Entered cpx labs in EPIC.Marland KitchenMarland Kitchen9/13/12@10 :29am/LMB

## 2011-09-12 ENCOUNTER — Other Ambulatory Visit: Payer: Self-pay | Admitting: Internal Medicine

## 2011-09-12 ENCOUNTER — Other Ambulatory Visit (INDEPENDENT_AMBULATORY_CARE_PROVIDER_SITE_OTHER): Payer: BC Managed Care – PPO

## 2011-09-12 DIAGNOSIS — Z125 Encounter for screening for malignant neoplasm of prostate: Secondary | ICD-10-CM

## 2011-09-12 DIAGNOSIS — Z0000 Encounter for general adult medical examination without abnormal findings: Secondary | ICD-10-CM

## 2011-09-12 LAB — URINALYSIS, ROUTINE W REFLEX MICROSCOPIC
Ketones, ur: NEGATIVE
Specific Gravity, Urine: 1.02 (ref 1.000–1.030)
Urine Glucose: NEGATIVE
Urobilinogen, UA: 0.2 (ref 0.0–1.0)

## 2011-09-12 LAB — CBC WITH DIFFERENTIAL/PLATELET
Basophils Absolute: 0 10*3/uL (ref 0.0–0.1)
HCT: 42.4 % (ref 39.0–52.0)
Hemoglobin: 14.1 g/dL (ref 13.0–17.0)
Lymphs Abs: 2 10*3/uL (ref 0.7–4.0)
MCHC: 33.2 g/dL (ref 30.0–36.0)
MCV: 92 fl (ref 78.0–100.0)
Monocytes Absolute: 0.5 10*3/uL (ref 0.1–1.0)
Monocytes Relative: 8.9 % (ref 3.0–12.0)
Neutro Abs: 2.4 10*3/uL (ref 1.4–7.7)
RDW: 13.2 % (ref 11.5–14.6)

## 2011-09-13 LAB — HEPATIC FUNCTION PANEL: Total Bilirubin: 0.9 mg/dL (ref 0.3–1.2)

## 2011-09-13 LAB — BASIC METABOLIC PANEL
CO2: 28 mEq/L (ref 19–32)
Chloride: 107 mEq/L (ref 96–112)
GFR: 106.84 mL/min (ref >60.00)
Glucose, Bld: 78 mg/dL (ref 70–99)
Potassium: 4.4 mEq/L (ref 3.5–5.1)
Sodium: 142 mEq/L (ref 135–145)

## 2011-09-13 LAB — PSA: PSA: 0.59 ng/mL (ref 0.10–4.00)

## 2011-09-13 LAB — LIPID PANEL
HDL: 52.6 mg/dL (ref >39.00)
Total CHOL/HDL Ratio: 4
Triglycerides: 99 mg/dL (ref 0.0–149.0)
VLDL: 19.8 mg/dL (ref 0.0–40.0)

## 2011-09-17 ENCOUNTER — Encounter: Payer: Self-pay | Admitting: Internal Medicine

## 2011-09-17 ENCOUNTER — Ambulatory Visit (INDEPENDENT_AMBULATORY_CARE_PROVIDER_SITE_OTHER): Payer: BC Managed Care – PPO | Admitting: Internal Medicine

## 2011-09-17 VITALS — BP 108/70 | HR 79 | Temp 98.1°F | Wt 209.8 lb

## 2011-09-17 DIAGNOSIS — Z0000 Encounter for general adult medical examination without abnormal findings: Secondary | ICD-10-CM

## 2011-09-17 DIAGNOSIS — Z23 Encounter for immunization: Secondary | ICD-10-CM

## 2011-09-17 MED ORDER — MELOXICAM 15 MG PO TABS: 15.0000 mg | ORAL_TABLET | Freq: Every day | ORAL | Status: DC | PRN

## 2011-09-17 MED ORDER — CYCLOBENZAPRINE HCL 10 MG PO TABS: 10.0000 mg | ORAL_TABLET | Freq: Two times a day (BID) | ORAL | Status: DC | PRN

## 2011-09-17 NOTE — Patient Instructions (Addendum)
It was good to see you today. We have reviewed your prior records including labs and tests today Will watch your cholesterol numbers - may need to consider treatment with cholesterol medications if still elevated LDL after thyroid status stabilized Resume flexeril for muscle spasm and meloxicam daily as needed for joint pain - Your prescription(s) have been submitted to your pharmacy. Please take as directed and contact our office if you believe you are having problem(s) with the medication(s). Keep working with Drs. Sharl Ma, Bond and Elsner Please schedule followup in 12 months for physical and labs, call sooner if problems.

## 2011-09-17 NOTE — Progress Notes (Signed)
Subjective:    Patient ID: Elijah Ashley, male    DOB: 06-Oct-1966, 45 y.o.   MRN: [id number]  HPI patient is here today for annual physical. Patient feels well and has no complaints.  Past Medical History  Diagnosis Date  . HYPOTHYROIDISM     postsurgical  . LOW BACK PAIN     L4-5 degen disc on MRI - s/p ESI 09/2010  . URINARY RETENTION   . 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 Glaucoma (increased eye pressure)    Past Surgical History  Procedure Date  . Total thyroidectomy 03/2009    graves and cold nodule (papillary ca)  . Cervical laminectomy 07/2007    C5-6 ACDF due to myelopathy   Family History  Problem Relation Age of Onset  . Breast cancer Other   . Cervical cancer Other   . Colon cancer Other   . Hypertension Other   . Liver disease Sister   . Diabetes Father    History  Substance Use Topics  . Smoking status: Former Smoker    Quit date: 03/17/2005  . Smokeless tobacco: Not on file  . Alcohol Use: Yes    Review of Systems Constitutional: Negative for fever.  Respiratory: Negative for cough and shortness of breath.   Cardiovascular: Negative for chest pain.  Gastrointestinal: Negative for abdominal pain.  Musculoskeletal: Negative for gait problem.  Skin: Negative for rash.  Neurological: Negative for dizziness.  No other specific complaints in a complete review of systems (except as listed in HPI above).     Objective:   Physical Exam BP 108/70  Pulse 79  Temp(Src) 98.1 F (36.7 C) (Oral)  Wt 209 lb 12.8 oz (95.165 kg)  SpO2 98% Wt Readings from Last 3 Encounters:  09/17/11 209 lb 12.8 oz (95.165 kg)  09/17/10 198 lb (89.812 kg)  09/12/10 198 lb 4 oz (89.926 kg)    Constitutional:  Thin, tall. appears well-developed and well-nourished. No distress.  Neck: Normal range of motion. Neck supple. No JVD present. Anterior surg scar well healed, no thyroid or nodules present.    Cardiovascular: Normal rate, regular rhythm and normal heart sounds.  No murmur heard. no BLE edema Pulmonary/Chest: Effort normal and breath sounds normal. No respiratory distress. no wheezes.  Abdominal: Soft. Bowel sounds are normal. Patient exhibits no distension. There is no tenderness.  Musculoskeletal: Normal range of motion hands, no joint effusions or deformities. Patient exhibits no edema.  GU: defer Neurological: he is alert and oriented to person, place, and time. No cranial nerve deficit. Coordination normal.  Skin: Skin is warm and dry.  No erythema or ulceration.  Psychiatric: he has a normal mood and affect. behavior is normal. Judgment and thought content normal.    Lab Results  Component Value Date   WBC 5.1 09/12/2011   HGB 14.1 09/12/2011   HCT 42.4 09/12/2011   PLT 114.0* 09/12/2011   CHOL 234* 09/12/2011   TRIG 99.0 09/12/2011   HDL 52.60 09/12/2011   LDLDIRECT 159.8 09/12/2011   ALT 22 09/12/2011   AST 22 09/12/2011   NA 142 09/12/2011   K 4.4 09/12/2011   CL 107 09/12/2011   CREATININE 1.0 09/12/2011   BUN 14 09/12/2011   CO2 28 09/12/2011   TSH 2.20 09/12/2011   PSA 0.59 09/12/2011   INR 1.0 06/14/2009       Assessment & Plan:  CPX - v70.0 - Patient has  been counseled on age-appropriate routine health concerns for screening and prevention. These are reviewed and up-to-date. Immunizations are up-to-date or declined. Labs and ECG reviewed.

## 2011-09-30 LAB — DIFFERENTIAL
Basophils Absolute: 0.1
Lymphs Abs: 2.8
Monocytes Absolute: 0.6
Smear Review: NORMAL

## 2011-09-30 LAB — URINALYSIS, ROUTINE W REFLEX MICROSCOPIC
Glucose, UA: NEGATIVE
Hgb urine dipstick: NEGATIVE
Ketones, ur: NEGATIVE
Protein, ur: NEGATIVE
Urobilinogen, UA: 0.2

## 2011-09-30 LAB — COMPREHENSIVE METABOLIC PANEL
ALT: 43
AST: 25
CO2: 28
Chloride: 104
GFR calc Af Amer: >60
GFR calc non Af Amer: >60
Sodium: 139
Total Bilirubin: 0.8

## 2011-09-30 LAB — CBC
RBC: 5.05
WBC: 7.1

## 2011-10-29 ENCOUNTER — Telehealth: Payer: Self-pay | Admitting: *Deleted

## 2011-10-29 NOTE — Telephone Encounter (Signed)
Complaining of sharp CP on (L) side only when he takes a deep breath, inhalation only...10/29/11@11 :34am/LMB

## 2011-10-29 NOTE — Telephone Encounter (Signed)
Will check CT angio r/o PE  (order done, [facility] to arrange)- if no blood clot or lung issues on CT, symptoms likely related to pleurisy - same explained to pt's spouse - thanks

## 2011-10-30 ENCOUNTER — Ambulatory Visit (INDEPENDENT_AMBULATORY_CARE_PROVIDER_SITE_OTHER)
Admission: RE | Admit: 2011-10-30 | Discharge: 2011-10-30 | Disposition: A | Discharge status: Home / Self Care | Payer: BC Managed Care – PPO | Source: Ambulatory Visit | Attending: Internal Medicine | Admitting: Internal Medicine

## 2011-10-30 DIAGNOSIS — R079 Chest pain, unspecified: Secondary | ICD-10-CM

## 2011-10-30 MED ORDER — IOHEXOL 300 MG/ML  SOLN
80.0000 mL | Freq: Once | INTRAMUSCULAR | Status: AC | PRN
  Administered 2011-10-30: 80 mL via INTRAVENOUS

## 2011-11-05 DIAGNOSIS — H2510 Age-related nuclear cataract, unspecified eye: Secondary | ICD-10-CM | POA: Insufficient documentation

## 2011-11-05 DIAGNOSIS — H401132 Primary open-angle glaucoma, bilateral, moderate stage: Secondary | ICD-10-CM | POA: Insufficient documentation

## 2011-11-12 ENCOUNTER — Encounter: Payer: Self-pay | Admitting: Internal Medicine

## 2011-11-12 ENCOUNTER — Ambulatory Visit (INDEPENDENT_AMBULATORY_CARE_PROVIDER_SITE_OTHER): Payer: BC Managed Care – PPO | Admitting: Internal Medicine

## 2011-11-12 ENCOUNTER — Encounter: Payer: Self-pay | Admitting: *Deleted

## 2011-11-12 VITALS — BP 116/72 | HR 112 | Temp 99.9°F | Ht 77.0 in | Wt 218.3 lb

## 2011-11-12 DIAGNOSIS — R300 Dysuria: Secondary | ICD-10-CM

## 2011-11-12 DIAGNOSIS — N410 Acute prostatitis: Secondary | ICD-10-CM

## 2011-11-12 LAB — POCT URINALYSIS DIPSTICK
Bilirubin, UA: NEGATIVE
Spec Grav, UA: >=1.03
pH, UA: 6

## 2011-11-12 MED ORDER — CIPROFLOXACIN HCL 500 MG PO TABS: 500.0000 mg | ORAL_TABLET | Freq: Two times a day (BID) | ORAL | Status: AC

## 2011-11-12 NOTE — Progress Notes (Signed)
  Subjective:    Patient ID: Elijah Ashley, male    DOB: October 28, 1966, 45 y.o.   MRN: [id number]  HPI  complains of dysuria Onset 5 days ago symptoms increasing associated with with low back discomfort and chills  Past Medical History  Diagnosis Date  . HYPOTHYROIDISM     postsurgical  . LOW BACK PAIN     L4-5 degen disc on MRI - s/p ESI 09/2010  . URINARY RETENTION   . 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 Glaucoma (increased eye pressure)     Review of Systems Gen: no weight loss, +noight sweats HENT: no sore throat or sinus pain Resp: no cough or shortness of breath     Objective:   Physical Exam BP 116/72  Pulse 112  Temp(Src) 99.9 F (37.7 C) (Oral)  Ht 6\' 5"  (1.956 m)  Wt 218 lb 4.8 oz (99.02 kg)  BMI 25.89 kg/m2  SpO2 97% Wt Readings from Last 3 Encounters:  11/12/11 218 lb 4.8 oz (99.02 kg)  09/17/11 209 lb 12.8 oz (95.165 kg)  09/17/10 198 lb (89.812 kg)   Constitutional:  He appears well-developed and well-nourished. No distress.  Cardiovascular: Normal rate, regular rhythm and normal heart sounds.  No murmur heard. no BLE edema Pulmonary/Chest: Effort normal and breath sounds normal. No respiratory distress. no wheezes.  Abd: tender suprapubic and lower SI region of back - no r/g, +BS QM:VHQIONGE at pt request Skin: Skin is warm and dry.  No erythema or ulceration.  Psychiatric: he has a normal mood and affect. behavior is normal. Judgment and thought content normal.   Lab Results  Component Value Date   WBC 5.1 09/12/2011   HGB 14.1 09/12/2011   HCT 42.4 09/12/2011   PLT 114.0* 09/12/2011   GLUCOSE 78 09/12/2011   CHOL 234* 09/12/2011   TRIG 99.0 09/12/2011   HDL 52.60 09/12/2011   LDLDIRECT 159.8 09/12/2011   ALT 22 09/12/2011   AST 22 09/12/2011   NA 142 09/12/2011   K 4.4 09/12/2011   CL 107 09/12/2011   CREATININE 1.0 09/12/2011   BUN 14 09/12/2011   CO2 28 09/12/2011   TSH 2.20 09/12/2011     PSA 0.59 09/12/2011   INR 1.0 06/14/2009       Assessment & Plan:  Acute prostatitis - +Udip and classic symptoms - tx cipro x 3 weeks and advised hydration and tylenol and advil

## 2011-11-12 NOTE — Patient Instructions (Signed)
Prostatitis Prostatitis is an inflammation (the body's way of reacting to injury and/or infection) of the prostate gland. The prostate gland is a male organ. The gland is about the size and shape of a walnut. The prostate is located just below the bladder. It produces semen, which is a fluid that helps nourish and transport sperm. Prostatitis is the most common urinary tract problem in men younger than age 44. There are 4 categories of prostatitis:  I - Acute bacterial prostatitis.   II - Chronic bacterial prostatitis.   III - Chronic prostatitis and chronic pelvic pain syndrome (CPPS).   Inflammatory.   Non inflammatory.   IV - Asymptomatic inflammatory prostatitis.  Acute and chronic bacterial prostatitis are problems with bacterial infections of the prostate. "Acute" infection is usually a one-time problem. "Chronic" bacterial prostatitis is a condition with recurrent infection. It is usually caused by the same germ(bacteria). CPPS has symptoms similar to prostate infection. However, no infection is actually found. This condition can cause problems of ongoing pain. Currently, it cannot be cured. Treatments are available and aimed at symptom control.  Asymptomatic inflammatory prostatitis has no symptoms. It is a condition where infection-fighting cells are found by chance in the urine. The diagnosis is made most often during an exam for other conditions. Other conditions could be infertility or a high level of PSA (prostate-specific antigen) in the blood. SYMPTOMS  Symptoms can vary depending upon the type of prostatitis that exists. There can also be overlap in symptoms. This can make diagnosis difficult. Symptoms: For Acute bacterial prostatitis  Painful urination.   Fever or chills.   Muscle or joint pains.   Low back pain.   Low abdominal pain.   Inability to empty bladder completely.   Sudden urges to urinate.   Frequent urination during the day.   Difficulty starting  urine stream.   Need to urinate several times at night (nocturia).   Weak urine stream.   Urethral (tube that carries urine from the bladder out of the body) discharge and dribbling after urination.  For Chronic bacterial prostatitis  Rectal pain.   Pain in the testicles, penis, or tip of the penis.   Pain in the space between the anus and scrotum (perineum).   Low back pain.   Low abdominal pain.   Problems with sexual function.   Painful ejaculation.   Bloody semen.   Inability to empty bladder completely.   Painful urination.   Sudden urges to urinate.   Frequent urination during the day.   Difficulty starting urine stream.   Need to urinate several times at night (nocturia).   Weak urine stream.   Dribbling after urination.   Urethral discharge.  For Chronic prostatitis and chronic pelvic pain syndrome (CPPS) Symptoms are the same as those for chronic bacterial prostatitis. Problems with sexual function are often the reason for seeking care. This important problem should be discussed with your caregiver. For Asymptomatic inflammatory prostatitis As noted above, there are no symptoms with this condition. DIAGNOSIS   Your caregiver may perform a rectal exam. This exam is to determine if the prostate is swollen and tender.   Sometimes blood work is performed. This is done to see if your white blood cell count is elevated. The Prostate Specific Antigen (PSA) is also measured. PSA is a blood test that can help detect early prostate cancer.   A urinalysis is done to find out what type of infection is present if this is a suspected cause. An   additional urinalysis may be done after a digital rectal exam. This is to see if white blood cells are pushed out of the prostate and into the urine. A low-grade infection of the prostate may not be found on the first urinalysis.  In more difficult cases, your caregiver may advise other tests. Tests could include:  Urodynamics  -- Tests the function of the bladder and the organs involved in triggering and controlling normal urination.   Urine flow rate.   Cystoscopy -- In this procedure, a thin, telescope-like tube with a light and tiny camera attached (cystoscope) is inserted into the bladder through the urethra. This allows the caregiver to see the inside of the urethra and bladder.   Electromyography -- This procedure tests how the muscles and nerves of the bladder work. It is focused on the muscles that control the anus and pelvic floor. These are the muscles between the anus and scrotum.  In people who show no signs of infection, certain uncommon infections might be causing constant or recurrent symptoms. These uncommon infections are difficult to detect. More work in medicine may help find solutions to these problems. TREATMENT  Antibiotics are used to treat infections caused by germs. If the infection is not treated and becomes long lasting (chronic), it may become a lower grade infection with minor, continual problems. Without treatment, the prostate may develop a boil or furuncle (abscess). This may require surgical treatment. For those with chronic prostatitis and CPPS, it is important to work closely with your primary caregiver and urologist. For some, the medicines that are used to treat a non-cancerous, enlarged prostate (benign prostatic hypertrophy) may be helpful. Referrals to specialists other than urologists may be necessary. In rare cases when all treatments have been inadequate for pain control, an operation to remove the prostate may be recommended. This is very rare and before this is considered thorough discussion with your urologist is highly recommended.  In cases of secondary to chronic non-bacterial prostatitis, a good relationship with your urologist or primary caregiver is essential because it is often a recurrent prolonged condition that requires a good understanding of the causes and a commitment  to therapy aimed at controlling your symptoms. HOME CARE INSTRUCTIONS   Hot sitz baths for 20 minutes, 4 times per day, may help relieve pain.   Non-prescription pain killers may be used as your caregiver recommends if you have no allergies to them. Some illnesses or conditions prevent use of non-prescription drugs. If unsure, check with your caregiver. Take all medications as directed. Take the antibiotics for the prescribed length of time, even if you are feeling better.  SEEK MEDICAL CARE IF:   You have any worsening of the symptoms that originally brought you to your caregiver.   You have an oral temperature above 102 F (38.9 C).   You experience any side effects from medications prescribed.  SEEK IMMEDIATE MEDICAL CARE IF:   You have an oral temperature above 102 F (38.9 C), not controlled by medicine.   You have pain not relieved with medications.   You develop nausea, vomiting, lightheadedness, or have a fainting episode.   You are unable to urinate.   You pass bloody urine or clots.  Document Released: 11/29/2000 Document Revised: 08/14/2011 Document Reviewed: 06/20/2009 [employer] Patient Information 2012 [employer]   by medicine.     You have pain not relieved with medications.     You develop nausea, vomiting, lightheadedness, or have a fainting episode.     You are unable to urinate.     You pass bloody urine or clots.  Document Released: 11/29/2000 Document Revised: 08/14/2011 Document Reviewed: 06/20/2009 [facility] Patient Information 2012 [place], [place].

## 2011-12-02 ENCOUNTER — Other Ambulatory Visit: Payer: Self-pay | Admitting: Internal Medicine

## 2011-12-02 DIAGNOSIS — C73 Malignant neoplasm of thyroid gland: Secondary | ICD-10-CM

## 2011-12-11 ENCOUNTER — Telehealth: Payer: Self-pay

## 2011-12-11 NOTE — Telephone Encounter (Signed)
A user error has taken place: encounter opened in error, closed for administrative reasons.

## 2011-12-12 ENCOUNTER — Inpatient Hospital Stay: Admission: RE | Admit: 2011-12-12 | Payer: BC Managed Care – PPO | Source: Ambulatory Visit

## 2011-12-13 ENCOUNTER — Ambulatory Visit
Admission: RE | Admit: 2011-12-13 | Discharge: 2011-12-13 | Disposition: A | Discharge status: Home / Self Care | Payer: BC Managed Care – PPO | Source: Ambulatory Visit | Attending: Internal Medicine | Admitting: Internal Medicine

## 2011-12-13 DIAGNOSIS — C73 Malignant neoplasm of thyroid gland: Secondary | ICD-10-CM

## 2012-02-24 ENCOUNTER — Other Ambulatory Visit: Payer: Self-pay | Admitting: *Deleted

## 2012-02-24 MED ORDER — TRAVOPROST 0.004 % OP SOLN: 1.0000 [drp] | Freq: Every day | OPHTHALMIC | Status: DC

## 2012-02-24 MED ORDER — LATANOPROST 0.005 % OP SOLN: 1.0000 [drp] | Freq: Every day | OPHTHALMIC | Status: AC

## 2012-02-24 NOTE — Telephone Encounter (Signed)
Pt requesting refill of eye drops.

## 2012-02-24 NOTE — Telephone Encounter (Signed)
Pt needs refill of Eye Drops sent to [facility].

## 2012-10-21 ENCOUNTER — Other Ambulatory Visit: Payer: Self-pay | Admitting: Internal Medicine

## 2012-10-21 DIAGNOSIS — Z85850 Personal history of malignant neoplasm of thyroid: Secondary | ICD-10-CM

## 2012-10-22 ENCOUNTER — Other Ambulatory Visit: Payer: BC Managed Care – PPO

## 2012-11-05 ENCOUNTER — Ambulatory Visit (INDEPENDENT_AMBULATORY_CARE_PROVIDER_SITE_OTHER): Payer: BC Managed Care – PPO | Admitting: Internal Medicine

## 2012-11-05 ENCOUNTER — Encounter: Payer: Self-pay | Admitting: Internal Medicine

## 2012-11-05 VITALS — BP 112/64 | HR 67 | Temp 97.0°F | Ht 77.0 in | Wt 216.1 lb

## 2012-11-05 DIAGNOSIS — F419 Anxiety disorder, unspecified: Secondary | ICD-10-CM | POA: Insufficient documentation

## 2012-11-05 DIAGNOSIS — H409 Unspecified glaucoma: Secondary | ICD-10-CM | POA: Insufficient documentation

## 2012-11-05 DIAGNOSIS — E039 Hypothyroidism, unspecified: Secondary | ICD-10-CM

## 2012-11-05 DIAGNOSIS — F411 Generalized anxiety disorder: Secondary | ICD-10-CM

## 2012-11-05 DIAGNOSIS — Z0000 Encounter for general adult medical examination without abnormal findings: Secondary | ICD-10-CM

## 2012-11-05 MED ORDER — FLUOXETINE HCL 20 MG PO CAPS: 20.0000 mg | ORAL_CAPSULE | Freq: Every day | ORAL | Status: DC

## 2012-11-05 MED ORDER — FLUOCINOLONE ACETONIDE 0.01 % EX OIL: TOPICAL_OIL | Freq: Three times a day (TID) | CUTANEOUS | Status: DC

## 2012-11-05 MED ORDER — MELOXICAM 15 MG PO TABS: 15.0000 mg | ORAL_TABLET | Freq: Every day | ORAL | Status: DC | PRN

## 2012-11-05 MED ORDER — CYCLOBENZAPRINE HCL 10 MG PO TABS: 10.0000 mg | ORAL_TABLET | Freq: Two times a day (BID) | ORAL | Status: AC | PRN

## 2012-11-05 NOTE — Assessment & Plan Note (Signed)
Encouraged to comply with SSRI as prescribed earlier by psyc - refills provided Also prn clonazepam - reviewed appropriate use of qd vs prn meds Encouraged follow up with counselor (peter) for emotional support and call if symptoms worse or unimproved over winter/holidays Verified no SI/HI

## 2012-11-05 NOTE — Progress Notes (Signed)
Subjective:    Patient ID: Elijah Ashley, male    DOB: [birth year], 46 y.o.   MRN: [id number]  HPI  patient is here today for annual physical. Patient feels well overall  Persisting neck and joint aches - no weakness - moderate relief with Relafen, but not taking at this time  Also complains of anxiety - infrequent use of prozac as rx'd by psyc, previously in counseling (helpful) but "no time" for same in past 3 months  Thyroid cancer hx, s/p total resection 2010 - post surgical hypothyroidism - follows with endo for same - the patient reports compliance with medication(s) as prescribed. Denies adverse side effects.  Past Medical History  Diagnosis Date  . HYPOTHYROIDISM     postsurgical  . LOW BACK PAIN     L4-5 degen disc on MRI - s/p ESI 09/2010  . URINARY RETENTION   . 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 Glaucoma (increased eye pressure)   . Psoriasis   . Anxiety    Past Surgical History  Procedure Date  . Total thyroidectomy 03/2009    graves and cold nodule (papillary ca)  . Cervical laminectomy 07/2007    C5-6 ACDF due to myelopathy   Family History  Problem Relation Age of Onset  . Breast cancer Other   . Cervical cancer Other   . Colon cancer Other   . Hypertension Other   . Liver disease Sister   . Diabetes Father    History  Substance Use Topics  . Smoking status: Never Smoker   . Smokeless tobacco: Not on file  . Alcohol Use: Yes    Review of Systems  Constitutional: Negative for fever or unexpected weight gain.  Respiratory: Negative for cough and shortness of breath.   Cardiovascular: Negative for chest pain or palpitations.  Gastrointestinal: Negative for abdominal pain.  Musculoskeletal: Negative for gait problem or joint swelling.  Skin: Negative for rash.  Neurological: Negative for dizziness.  No other specific complaints in a complete review of systems (except as listed in HPI  above).     Objective:   Physical Exam  BP 112/64  Pulse 67  Temp 97 F (36.1 C) (Oral)  Ht 6\' 5"  (1.956 m)  Wt 216 lb 1.9 oz (98.031 kg)  BMI 25.63 kg/m2  SpO2 98% Wt Readings from Last 3 Encounters:  11/05/12 216 lb 1.9 oz (98.031 kg)  11/12/11 218 lb 4.8 oz (99.02 kg)  09/17/11 209 lb 12.8 oz (95.165 kg)   Constitutional:  Thin, tall. appears well-developed and well-nourished. No distress.  Neck: Normal range of motion. Neck supple. No JVD present. Anterior surg scar well healed, no thyroid or nodules present.  Cardiovascular: Normal rate, regular rhythm and normal heart sounds.  No murmur heard. no BLE edema Pulmonary/Chest: Effort normal and breath sounds normal. No respiratory distress. no wheezes.  Abdominal: Soft. Bowel sounds are normal. Patient exhibits no distension. There is no tenderness.  Musculoskeletal: Normal range of motion hands, no joint effusions or deformities. Patient exhibits no edema.  GU: defer Neurological: he is alert and oriented to person, place, and time. No cranial nerve deficit. Coordination normal.  Skin: Skin is warm and dry.  No erythema or ulceration.  Psychiatric: he has a normal mood and affect. behavior is normal. Judgment and thought content normal.    Lab Results  Component Value Date   WBC 5.1 09/12/2011   HGB 14.1 09/12/2011  HCT 42.4 09/12/2011   PLT 114.0* 09/12/2011   CHOL 234* 09/12/2011   TRIG 99.0 09/12/2011   HDL 52.60 09/12/2011   LDLDIRECT 159.8 09/12/2011   ALT 22 09/12/2011   AST 22 09/12/2011   NA 142 09/12/2011   K 4.4 09/12/2011   CL 107 09/12/2011   CREATININE 1.0 09/12/2011   BUN 14 09/12/2011   CO2 28 09/12/2011   TSH 2.20 09/12/2011   PSA 0.59 09/12/2011   INR 1.0 06/14/2009       Assessment & Plan:  CPX - v70.0 - Patient has been counseled on age-appropriate routine health concerns for screening and prevention. These are reviewed and up-to-date. Immunizations are up-to-date or declined. Labs ordered and reviewed.

## 2012-11-05 NOTE — Patient Instructions (Addendum)
It was good to see you today. Test(s) ordered today. Your results will be released to MyChart (or called to you) after review, usually within 72hours after test completion. If any changes need to be made, you will be notified at that same time. Health Maintenance reviewed - all recommended immunizations and age-appropriate screenings are up-to-date or declined. Watch your cholesterol numbers - may need to consider treatment with cholesterol medications if still elevated LDL Resume flexeril for muscle spasm and meloxicam daily as needed for joint pain -  Continue Prazac 20mg  daily (EVERY day) for balancing mood - Your prescription(s) and refills have been submitted to your pharmacy. Please take as directed and contact our office if you believe you are having problem(s) with the medication(s). Other medications reviewed, no changes at this time. Keep working with Drs. Marcelino Scot and Elsner Please schedule followup in 12 months for physical and labs, call sooner if problems.   Health Maintenance, Males A healthy lifestyle and preventative care can promote health and wellness.  Maintain regular health, dental, and eye exams.   Eat a healthy diet. Foods like vegetables, fruits, whole grains, low-fat dairy products, and lean protein foods contain the nutrients you need without too many calories. Decrease your intake of foods high in solid fats, added sugars, and salt. Get information about a proper diet from your caregiver, if necessary.   Regular physical exercise is one of the most important things you can do for your health. Most adults should get at least 150 minutes of moderate-intensity exercise (any activity that increases your heart rate and causes you to sweat) each week. In addition, most adults need muscle-strengthening exercises on 2 or more days a week.     Maintain a healthy weight. The body mass index (BMI) is a screening tool to identify possible weight problems. It provides an estimate  of body fat based on height and weight. Your caregiver can help determine your BMI, and can help you achieve or maintain a healthy weight. For adults 20 years and older:   A BMI below 18.5 is considered underweight.   A BMI of 18.5 to 24.9 is normal.   A BMI of 25 to 29.9 is considered overweight.   A BMI of 30 and above is considered obese.   Maintain normal blood lipids and cholesterol by exercising and minimizing your intake of saturated fat. Eat a balanced diet with plenty of fruits and vegetables. Blood tests for lipids and cholesterol should begin at age 67 and be repeated every 5 years. If your lipid or cholesterol levels are high, you are over 50, or you are a high risk for heart disease, you may need your cholesterol levels checked more frequently. Ongoing high lipid and cholesterol levels should be treated with medicines, if diet and exercise are not effective.   If you smoke, find out from your caregiver how to quit. If you do not use tobacco, do not start.   If you choose to drink alcohol, do not exceed 2 drinks per day. One drink is considered to be 12 ounces (355 mL) of beer, 5 ounces (148 mL) of wine, or 1.5 ounces (44 mL) of liquor.   Avoid use of street drugs. Do not share needles with anyone. Ask for help if you need support or instructions about stopping the use of drugs.   High blood pressure causes heart disease and increases the risk of stroke. Blood pressure should be checked at least every 1 to 2 years. Ongoing high  blood pressure should be treated with medicines if weight loss and exercise are not effective.   If you are 17 to 45 years old, ask your caregiver if you should take aspirin to prevent heart disease.   Diabetes screening involves taking a blood sample to check your fasting blood sugar level. This should be done once every 3 years, after age 4, if you are within normal weight and without risk factors for diabetes. Testing should be considered at a younger age  or be carried out more frequently if you are overweight and have at least 1 risk factor for diabetes.   Colorectal cancer can be detected and often prevented. Most routine colorectal cancer screening begins at the age of 53 and continues through age 66. However, your caregiver may recommend screening at an earlier age if you have risk factors for colon cancer. On a yearly basis, your caregiver may provide home test kits to check for hidden blood in the stool. Use of a small camera at the end of a tube, to directly examine the colon (sigmoidoscopy or colonoscopy), can detect the earliest forms of colorectal cancer. Talk to your caregiver about this at age 45, when routine screening begins. Direct examination of the colon should be repeated every 5 to 10 years through age 40, unless early forms of pre-cancerous polyps or small growths are found.   Hepatitis C blood testing is recommended for all people born from [birth year] through 1964 and any individual with known risks for hepatitis C.   Healthy men should no longer receive prostate-specific antigen (PSA) blood tests as part of routine cancer screening. Consult with your caregiver about prostate cancer screening.   Testicular cancer screening is not recommended for adolescents or adult males who have no symptoms. Screening includes self-exam, caregiver exam, and other screening tests. Consult with your caregiver about any symptoms you have or any concerns you have about testicular cancer.   Practice safe sex. Use condoms and avoid high-risk sexual practices to reduce the spread of sexually transmitted infections (STIs).   Use sunscreen with a sun protection factor (SPF) of 30 or greater. Apply sunscreen liberally and repeatedly throughout the day. You should seek shade when your shadow is shorter than you. Protect yourself by wearing long sleeves, pants, a wide-brimmed hat, and sunglasses year round, whenever you are outdoors.   Notify your caregiver of new  moles or changes in moles, especially if there is a change in shape or color. Also notify your caregiver if a mole is larger than the size of a pencil eraser.   A one-time screening for abdominal aortic aneurysm (AAA) and surgical repair of large AAAs by sound wave imaging (ultrasonography) is recommended for ages 66 to 48 years who are current or former smokers.   Stay current with your immunizations.  Document Released: 05/30/2008 Document Revised: 02/24/2012 Document Reviewed: 04/29/2011 [facility] Patient Information 2013 [place], [place].

## 2012-11-05 NOTE — Assessment & Plan Note (Signed)
Postsurgical following total thyroidectomy 03/2009 for papillary ca Follows with endo for same The current medical regimen is effective;  continue present plan and medications.  Lab Results  Component Value Date   TSH 2.20 09/12/2011

## 2012-11-06 ENCOUNTER — Other Ambulatory Visit (INDEPENDENT_AMBULATORY_CARE_PROVIDER_SITE_OTHER): Payer: BC Managed Care – PPO

## 2012-11-06 DIAGNOSIS — Z0000 Encounter for general adult medical examination without abnormal findings: Secondary | ICD-10-CM

## 2012-11-06 LAB — TSH: TSH: 0.02 u[IU]/mL — ABNORMAL LOW (ref 0.35–5.50)

## 2012-11-06 LAB — BASIC METABOLIC PANEL
CO2: 26 mEq/L (ref 19–32)
Chloride: 108 mEq/L (ref 96–112)
GFR: 125.26 mL/min (ref >60.00)
Glucose, Bld: 92 mg/dL (ref 70–99)
Potassium: 4.2 mEq/L (ref 3.5–5.1)
Sodium: 138 mEq/L (ref 135–145)

## 2012-11-06 LAB — CBC WITH DIFFERENTIAL/PLATELET
Basophils Absolute: 0 10*3/uL (ref 0.0–0.1)
HCT: 42.2 % (ref 39.0–52.0)
Hemoglobin: 14.1 g/dL (ref 13.0–17.0)
Lymphs Abs: 1.6 10*3/uL (ref 0.7–4.0)
Monocytes Relative: 11.4 % (ref 3.0–12.0)
Neutro Abs: 2.7 10*3/uL (ref 1.4–7.7)
RDW: 13.1 % (ref 11.5–14.6)

## 2012-11-06 LAB — LIPID PANEL
HDL: 43.4 mg/dL (ref >39.00)
Total CHOL/HDL Ratio: 5
VLDL: 18.4 mg/dL (ref 0.0–40.0)

## 2012-11-06 LAB — HEPATIC FUNCTION PANEL
ALT: 25 U/L (ref 0–53)
Total Bilirubin: 0.8 mg/dL (ref 0.3–1.2)

## 2012-11-09 ENCOUNTER — Other Ambulatory Visit: Payer: Self-pay | Admitting: *Deleted

## 2012-11-09 MED ORDER — FLUOCINOLONE ACETONIDE 0.01 % EX OIL: TOPICAL_OIL | Freq: Three times a day (TID) | CUTANEOUS | Status: DC

## 2012-12-28 ENCOUNTER — Other Ambulatory Visit: Payer: Self-pay | Admitting: Internal Medicine

## 2012-12-28 NOTE — Telephone Encounter (Signed)
Faced script abck to [facility],,,/lmb

## 2013-03-15 ENCOUNTER — Other Ambulatory Visit: Payer: Self-pay | Admitting: *Deleted

## 2013-03-15 MED ORDER — FLUOCINOLONE ACETONIDE 0.01 % EX OIL: TOPICAL_OIL | Freq: Three times a day (TID) | CUTANEOUS | Status: DC

## 2013-03-15 NOTE — Telephone Encounter (Signed)
Pt requesting refill on his fluocinolone 0.1% topical oil...lmb

## 2013-04-13 ENCOUNTER — Telehealth: Payer: Self-pay | Admitting: *Deleted

## 2013-04-13 NOTE — Telephone Encounter (Signed)
Received PA for Zolpidem form has been completed and fax back. Awaiting on approval status...Elijah Ashley

## 2013-04-14 NOTE — Telephone Encounter (Signed)
Received PA back this morning med was denied. Place on md desk for advisement...[name]

## 2013-04-14 NOTE — Telephone Encounter (Signed)
Check re: ?formulary alternatives or can resubmit request -  Form returned to [employer]  thanks

## 2013-04-15 NOTE — Telephone Encounter (Signed)
Ok to generate letter stating patient has chronic insomnia (the 1st PA was incorrect re: this information) requiring nightly medication to treat same. thanks

## 2013-04-15 NOTE — Telephone Encounter (Signed)
Generated letter fax to appeal office. Waiting on status...[name]

## 2013-04-15 NOTE — Telephone Encounter (Signed)
Notified insurance they states md can fax over letter to appeal office @ [phone]. The letter must states why pt is needing to take medication every night. Plan will only cover # 60 pills in  3 month...[name]

## 2013-04-20 MED ORDER — TRAZODONE HCL 50 MG PO TABS: 25.0000 mg | ORAL_TABLET | Freq: Every evening | ORAL | Status: DC | PRN

## 2013-04-20 NOTE — Telephone Encounter (Signed)
Wide notified....lmb

## 2013-04-20 NOTE — Telephone Encounter (Signed)
Done. thanks

## 2013-04-20 NOTE — Telephone Encounter (Signed)
We are still waiting on the appeal letter to be approve. Pt has taken trazodone in the past. Requesting rx to be sent to [email or web address]...lmb

## 2013-05-25 ENCOUNTER — Telehealth: Payer: Self-pay | Admitting: *Deleted

## 2013-05-25 NOTE — Telephone Encounter (Signed)
Pt states will be leaving to go to [place]. Wanting to see if there is any immunizations or anything he need to take prior...lmb

## 2013-05-25 NOTE — Telephone Encounter (Signed)
Notified pt with [facility] response. He has already had thypoid & yellow fever. Will check on other two & call back to schedule appt if he need...lmb

## 2013-05-25 NOTE — Telephone Encounter (Signed)
He needs Hep A, Hep B, thypoid and yellow fever if he has not already had them

## 2013-09-15 ENCOUNTER — Other Ambulatory Visit: Payer: Self-pay | Admitting: Internal Medicine

## 2013-10-11 ENCOUNTER — Other Ambulatory Visit: Payer: Self-pay | Admitting: Internal Medicine

## 2013-11-12 ENCOUNTER — Encounter: Payer: Self-pay | Admitting: Internal Medicine

## 2013-11-12 ENCOUNTER — Other Ambulatory Visit (INDEPENDENT_AMBULATORY_CARE_PROVIDER_SITE_OTHER): Payer: BC Managed Care – PPO

## 2013-11-12 ENCOUNTER — Ambulatory Visit (INDEPENDENT_AMBULATORY_CARE_PROVIDER_SITE_OTHER): Payer: BC Managed Care – PPO | Admitting: Internal Medicine

## 2013-11-12 VITALS — BP 112/62 | HR 68 | Temp 97.7°F | Ht 77.0 in | Wt 219.2 lb

## 2013-11-12 DIAGNOSIS — R42 Dizziness and giddiness: Secondary | ICD-10-CM

## 2013-11-12 DIAGNOSIS — K219 Gastro-esophageal reflux disease without esophagitis: Secondary | ICD-10-CM

## 2013-11-12 DIAGNOSIS — R1013 Epigastric pain: Secondary | ICD-10-CM

## 2013-11-12 DIAGNOSIS — R079 Chest pain, unspecified: Secondary | ICD-10-CM

## 2013-11-12 DIAGNOSIS — F419 Anxiety disorder, unspecified: Secondary | ICD-10-CM

## 2013-11-12 DIAGNOSIS — F411 Generalized anxiety disorder: Secondary | ICD-10-CM

## 2013-11-12 DIAGNOSIS — E039 Hypothyroidism, unspecified: Secondary | ICD-10-CM

## 2013-11-12 LAB — CBC WITH DIFFERENTIAL/PLATELET
Basophils Absolute: 0 10*3/uL (ref 0.0–0.1)
Eosinophils Absolute: 0.2 10*3/uL (ref 0.0–0.7)
Eosinophils Relative: 2.9 % (ref 0.0–5.0)
Hemoglobin: 14.2 g/dL (ref 13.0–17.0)
Lymphocytes Relative: 41 % (ref 12.0–46.0)
MCHC: 33.9 g/dL (ref 30.0–36.0)
Monocytes Relative: 10.2 % (ref 3.0–12.0)
Neutro Abs: 2.8 10*3/uL (ref 1.4–7.7)
Neutrophils Relative %: 45.2 % (ref 43.0–77.0)
RDW: 13.3 % (ref 11.5–14.6)
WBC: 6.3 10*3/uL (ref 4.5–10.5)

## 2013-11-12 LAB — BASIC METABOLIC PANEL
Calcium: 9.2 mg/dL (ref 8.4–10.5)
Creatinine, Ser: 0.8 mg/dL (ref 0.4–1.5)
GFR: 128.16 mL/min (ref >60.00)
Glucose, Bld: 82 mg/dL (ref 70–99)
Potassium: 4.5 mEq/L (ref 3.5–5.1)
Sodium: 137 mEq/L (ref 135–145)

## 2013-11-12 LAB — LIPASE: Lipase: 40 U/L (ref 11.0–59.0)

## 2013-11-12 LAB — LIPID PANEL
Cholesterol: 241 mg/dL — ABNORMAL HIGH (ref 0–200)
HDL: 39.9 mg/dL (ref >39.00)
Total CHOL/HDL Ratio: 6
Triglycerides: 150 mg/dL — ABNORMAL HIGH (ref 0.0–149.0)

## 2013-11-12 LAB — LDL CHOLESTEROL, DIRECT: Direct LDL: 176.7 mg/dL

## 2013-11-12 LAB — HEPATIC FUNCTION PANEL
ALT: 23 U/L (ref 0–53)
AST: 20 U/L (ref 0–37)
Albumin: 4.2 g/dL (ref 3.5–5.2)
Alkaline Phosphatase: 71 U/L (ref 39–117)
Total Bilirubin: 0.7 mg/dL (ref 0.3–1.2)
Total Protein: 7.1 g/dL (ref 6.0–8.3)

## 2013-11-12 MED ORDER — OMEPRAZOLE 20 MG PO CPDR: 20.0000 mg | DELAYED_RELEASE_CAPSULE | Freq: Every day | ORAL | Status: DC

## 2013-11-12 NOTE — Assessment & Plan Note (Signed)
Postsurgical following total thyroidectomy 03/2009 for papillary ca Follows with endo for same The current medical regimen is effective;  continue present plan and medications.  Lab Results  Component Value Date   TSH 0.02* 11/06/2012

## 2013-11-12 NOTE — Assessment & Plan Note (Signed)
Encouraged to comply with SSRI as prescribed by psyc - Also prn clonazepam - reviewed appropriate use of qd vs prn meds Encouraged follow up with counselor (peter) for emotional support and call if symptoms worse or unimproved over winter/holidays Verified no SI/HI

## 2013-11-12 NOTE — Progress Notes (Signed)
Subjective:    Patient ID: Elijah Ashley, male    DOB: Jan 06, 1966, 47 y.o.   MRN: [id number]  HPI  Complains "lower  chest pain"/epigastric pain Describes as stabbing sensation Symptoms worse with oral intake, but denies burning Somewhat improved in past 48 hours with use of PPI Symptoms intermittently present, not exertional or positional  Associated with increased emotional stress Concern for potential pancreatic cancer given sisters history of same age 60y  Also notes episodic dizziness, not exertional or positional Clarifies description as "not connected to my body" Worse with emotional stress Concerned related to reduction in thyroid medication one month ago Ongoing treatment for anxiety with psychiatry Dr. Tomasa Rand  Past Medical History  Diagnosis Date  . HYPOTHYROIDISM     postsurgical  . LOW BACK PAIN     L4-5 degen disc on MRI - s/p ESI 09/2010  . URINARY RETENTION   . 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 Glaucoma (increased eye pressure)   . Psoriasis   . Anxiety     Review of Systems  Constitutional: Positive for fatigue. Negative for fever and unexpected weight change.  Cardiovascular: Positive for chest pain. Negative for palpitations and leg swelling.  Gastrointestinal: Positive for abdominal pain. Negative for nausea, diarrhea, constipation, blood in stool and abdominal distention.  Neurological: Positive for dizziness. Negative for facial asymmetry, weakness and headaches.       Objective:   Physical Exam BP 112/62  Pulse 68  Temp(Src) 97.7 F (36.5 C) (Oral)  Ht 6\' 5"  (1.956 m)  Wt 219 lb 4 oz (99.451 kg)  BMI 25.99 kg/m2  SpO2 98% Wt Readings from Last 3 Encounters:  11/12/13 219 lb 4 oz (99.451 kg)  11/05/12 216 lb 1.9 oz (98.031 kg)  11/12/11 218 lb 4.8 oz (99.02 kg)   Constitutional: he appears well-developed and well-nourished. No distress.  HENT: Head: Normocephalic  and atraumatic. Ears: B TMs ok, no erythema or effusion; Nose: Nose normal. Mouth/Throat: Oropharynx is clear and moist. No oropharyngeal exudate.  Eyes: Conjunctivae and EOM are normal. Pupils are equal, round, and reactive to light. No scleral icterus.  Neck: Normal range of motion. Neck supple. No JVD present. No thyromegaly present.  Cardiovascular: Normal rate, regular rhythm and normal heart sounds.  No murmur heard. No BLE edema. Pulmonary/Chest: Effort normal and breath sounds normal. No respiratory distress. he has no wheezes. chest wall unremarkable Abdominal: Soft. Bowel sounds are normal. he exhibits no distension. There is mild tenderness near xiphoid process, but no rebound or guarding. no masses Neurological: he is alert and oriented to person, place, and time. No cranial nerve deficit. Coordination, balance, strength, speech and gait are normal.  Skin: Skin is warm and dry. No rash noted. No erythema.  Psychiatric: he has an anxious mood and affect. behavior is normal. Judgment and thought content normal.  Lab Results  Component Value Date   WBC 5.0 11/06/2012   HGB 14.1 11/06/2012   HCT 42.2 11/06/2012   PLT 123.0* 11/06/2012   GLUCOSE 92 11/06/2012   CHOL 205* 11/06/2012   TRIG 92.0 11/06/2012   HDL 43.40 11/06/2012   LDLDIRECT 144.7 11/06/2012   ALT 25 11/06/2012   AST 23 11/06/2012   NA 138 11/06/2012   K 4.2 11/06/2012   CL 108 11/06/2012   CREATININE 0.9 11/06/2012   BUN 12 11/06/2012   CO2 26 11/06/2012   TSH 0.02* 11/06/2012   PSA  0.59 09/12/2011   INR 1.0 06/14/2009       Assessment & Plan:   Epigastric pain/lower CP GERD FH hx pancreatitic cancer (sister dx age 75) Dizziness - suspect emotional stress  ECG without change from 09/2011 Add PPI Check labs Check CT a/p continue prozac and klonopin prn follow up 4 weeks, sooner if worse

## 2013-11-12 NOTE — Patient Instructions (Signed)
It was good to see you today.  We have reviewed your prior records including labs and tests today  Test(s) ordered today. Your results will be released to MyChart (or called to you) after review, usually within 72hours after test completion. If any changes need to be made, you will be notified at that same time.  Medications reviewed and updated Start Prilosec every day until further notice - no other changes  Your prescription(s) have been submitted to your pharmacy. Please take as directed and contact our office if you believe you are having problem(s) with the medication(s).  we'll make referral to CT abdomen to evaluate pain. Our office will contact you regarding appointment(s) once made.  follow up Dr Sharl Ma as discussed  Please schedule followup in 3-4 months, call sooner if problems.

## 2013-11-12 NOTE — Progress Notes (Signed)
Pre visit review using our clinic review tool, if applicable. No additional management support is needed unless otherwise documented below in the visit note. 

## 2013-11-16 ENCOUNTER — Ambulatory Visit: Payer: BC Managed Care – PPO | Admitting: Internal Medicine

## 2013-11-16 ENCOUNTER — Ambulatory Visit (INDEPENDENT_AMBULATORY_CARE_PROVIDER_SITE_OTHER)
Admission: RE | Admit: 2013-11-16 | Discharge: 2013-11-16 | Disposition: A | Discharge status: Home / Self Care | Payer: BC Managed Care – PPO | Source: Ambulatory Visit | Attending: Internal Medicine | Admitting: Internal Medicine

## 2013-11-16 DIAGNOSIS — R42 Dizziness and giddiness: Secondary | ICD-10-CM

## 2013-11-16 DIAGNOSIS — R1013 Epigastric pain: Secondary | ICD-10-CM

## 2013-11-16 DIAGNOSIS — K219 Gastro-esophageal reflux disease without esophagitis: Secondary | ICD-10-CM

## 2013-11-16 DIAGNOSIS — R079 Chest pain, unspecified: Secondary | ICD-10-CM

## 2013-11-16 MED ORDER — IOHEXOL 300 MG/ML  SOLN
100.0000 mL | Freq: Once | INTRAMUSCULAR | Status: AC | PRN
  Administered 2013-11-16: 100 mL via INTRAVENOUS

## 2013-12-26 ENCOUNTER — Other Ambulatory Visit: Payer: Self-pay | Admitting: Internal Medicine

## 2014-05-16 ENCOUNTER — Other Ambulatory Visit: Payer: Self-pay | Admitting: Internal Medicine

## 2014-05-27 ENCOUNTER — Other Ambulatory Visit: Payer: Self-pay | Admitting: Internal Medicine

## 2014-07-05 ENCOUNTER — Telehealth: Payer: Self-pay

## 2014-07-05 ENCOUNTER — Other Ambulatory Visit: Payer: Self-pay

## 2014-07-05 DIAGNOSIS — R6884 Jaw pain: Secondary | ICD-10-CM

## 2014-07-05 NOTE — Telephone Encounter (Signed)
Pt called to inform us of med changes made by Dr. Candis Schatz.  Added: Depakote 500mg  bid and Latuda 60mg  daily  Pt wanted an appointment and will call back to schedule. He is having a pain in his jaw and would like a referral to an ENT

## 2014-07-06 MED ORDER — DIVALPROEX SODIUM 500 MG PO DR TAB: 500.0000 mg | DELAYED_RELEASE_TABLET | Freq: Two times a day (BID) | ORAL | Status: DC

## 2014-07-06 MED ORDER — LURASIDONE HCL 60 MG PO TABS: 60.0000 mg | ORAL_TABLET | Freq: Every day | ORAL | Status: DC

## 2014-07-06 NOTE — Telephone Encounter (Signed)
Pt informed of referral

## 2014-07-06 NOTE — Telephone Encounter (Signed)
Med list updated ENT refer done thanks

## 2014-08-03 ENCOUNTER — Telehealth: Payer: Self-pay

## 2014-08-03 NOTE — Telephone Encounter (Signed)
Thanks for the message Sorry i did not have space in my schedule to see him today He is welcome to give you the information to relay to me - or i will look for information from his other docs to be faxed thanks

## 2014-08-03 NOTE — Telephone Encounter (Signed)
Called pt no answer LMOM RTC.../lmb 

## 2014-08-03 NOTE — Telephone Encounter (Signed)
Pt return call back he stated he is seeing his orthopedic md on Monday...Elijah Ashley

## 2014-08-03 NOTE — Telephone Encounter (Signed)
Pt stated that he wanted to see PCP today.  He has new medications and new diagnoses from other MD's. Wanted PCP to know of the changes.  Offered to schedule him for PCP first available appointment. He declined and stated that he wanted to be worked in today.  Informed that message would be sent to PCP.

## 2014-08-05 ENCOUNTER — Other Ambulatory Visit: Payer: Self-pay | Admitting: Orthopedic Surgery

## 2014-08-05 DIAGNOSIS — M542 Cervicalgia: Secondary | ICD-10-CM

## 2014-08-12 ENCOUNTER — Ambulatory Visit
Admission: RE | Admit: 2014-08-12 | Discharge: 2014-08-12 | Disposition: A | Discharge status: Home / Self Care | Payer: BC Managed Care – PPO | Source: Ambulatory Visit | Attending: Orthopedic Surgery | Admitting: Orthopedic Surgery

## 2014-08-12 DIAGNOSIS — M542 Cervicalgia: Secondary | ICD-10-CM

## 2014-08-26 ENCOUNTER — Other Ambulatory Visit: Payer: Self-pay | Admitting: Internal Medicine

## 2014-09-22 LAB — CBC AND DIFFERENTIAL
HEMATOCRIT: 40 % — AB (ref 41–53)
Hemoglobin: 13.4 g/dL — AB (ref 13.5–17.5)
Platelets: 133 10*3/uL — AB (ref 150–399)
WBC: 4.1 10*3/mL

## 2014-09-22 LAB — BASIC METABOLIC PANEL: GLUCOSE: 78 mg/dL

## 2014-09-22 LAB — TSH: TSH: 0.57 u[IU]/mL (ref 0.41–5.90)

## 2014-09-26 ENCOUNTER — Telehealth: Payer: Self-pay

## 2014-09-26 NOTE — Telephone Encounter (Signed)
Notified pt wife with md response,,,/lmb

## 2014-09-26 NOTE — Telephone Encounter (Signed)
Pt called and wanted an appointment for this week.   He states that he is having sx that include vertigo, fatigue and labs show low testosterone.   Informed pt that I would send this message to PCP.   Scheduled pt for 12/29/14 appointment.

## 2014-09-26 NOTE — Telephone Encounter (Signed)
I am sorry I do not have availability sooner -he is welcome to see one of my partners if he prefers Also, I recommend followup with endocrinology on his testosterone issue ( same doctor who treats his thyroid condition) Thanks

## 2014-10-05 ENCOUNTER — Encounter: Payer: Self-pay | Admitting: Internal Medicine

## 2014-10-10 ENCOUNTER — Other Ambulatory Visit: Payer: Self-pay | Admitting: Internal Medicine

## 2014-10-10 DIAGNOSIS — E221 Hyperprolactinemia: Secondary | ICD-10-CM

## 2014-10-16 ENCOUNTER — Other Ambulatory Visit: Payer: BC Managed Care – PPO

## 2014-10-25 ENCOUNTER — Encounter: Payer: Self-pay | Admitting: Neurology

## 2014-12-01 ENCOUNTER — Other Ambulatory Visit: Payer: Self-pay | Admitting: Internal Medicine

## 2014-12-04 ENCOUNTER — Other Ambulatory Visit: Payer: Self-pay | Admitting: Internal Medicine

## 2014-12-29 ENCOUNTER — Ambulatory Visit: Payer: BC Managed Care – PPO | Admitting: Internal Medicine

## 2014-12-29 ENCOUNTER — Telehealth: Payer: Self-pay | Admitting: Internal Medicine

## 2014-12-29 NOTE — Telephone Encounter (Signed)
Pt did not show up for the appt today, please advise if we need to call and reschedule? °

## 2015-02-27 ENCOUNTER — Other Ambulatory Visit: Payer: Self-pay | Admitting: Internal Medicine

## 2015-07-06 ENCOUNTER — Other Ambulatory Visit: Payer: Self-pay | Admitting: Internal Medicine

## 2015-08-04 ENCOUNTER — Ambulatory Visit (INDEPENDENT_AMBULATORY_CARE_PROVIDER_SITE_OTHER): Payer: BLUE CROSS/BLUE SHIELD | Admitting: Family Medicine

## 2015-08-04 ENCOUNTER — Encounter: Payer: Self-pay | Admitting: Family Medicine

## 2015-08-04 ENCOUNTER — Other Ambulatory Visit (INDEPENDENT_AMBULATORY_CARE_PROVIDER_SITE_OTHER): Payer: BLUE CROSS/BLUE SHIELD

## 2015-08-04 VITALS — BP 116/76 | HR 68 | Wt 238.0 lb

## 2015-08-04 DIAGNOSIS — M25561 Pain in right knee: Secondary | ICD-10-CM

## 2015-08-04 DIAGNOSIS — M7651 Patellar tendinitis, right knee: Secondary | ICD-10-CM

## 2015-08-04 DIAGNOSIS — M7650 Patellar tendinitis, unspecified knee: Secondary | ICD-10-CM | POA: Insufficient documentation

## 2015-08-04 MED ORDER — DICLOFENAC SODIUM 2 % TD SOLN: TRANSDERMAL | Status: DC

## 2015-08-04 NOTE — Progress Notes (Signed)
Pre visit review using our clinic review tool, if applicable. No additional management support is needed unless otherwise documented below in the visit note. 

## 2015-08-04 NOTE — Progress Notes (Signed)
[facility] [place],  [address] Phone: [phone] Subjective:    I'm seeing this patient by the request  of:  Gwendolyn Grant, MD   CC: Right knee pain  RDE:YCXKGYJEHU Elijah Ashley is a 49 y.o. male coming in with complaint of right knee pain.  Patient rates he is had this pain for proximal main 2 months. Does not rib or any true injury. States that is worse after sitting a long amount time and seems to be better after activity. States the pain seems to be mostly anterior. Gets better with activity. Denies any nighttime awakening. Has tried ibuprofen as well as meloxicam without any significant improvement.  Denies any locking. Denies any giving out on him. Rates the severity of pain though is 8 out of 10. States that driving seems to be worse. Past Medical History  Diagnosis Date  . HYPOTHYROIDISM     postsurgical  . LOW BACK PAIN     L4-5 degen disc on MRI - s/p ESI 09/2010  . URINARY RETENTION   . 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 Glaucoma (increased eye pressure)   . Psoriasis   . Anxiety    Past Surgical History  Procedure Laterality Date  . Total thyroidectomy  03/2009    graves and cold nodule (papillary ca)  . Cervical laminectomy  07/2007    C5-6 ACDF due to myelopathy   Social History  Substance Use Topics  . Smoking status: Never Smoker   . Smokeless tobacco: Not on file  . Alcohol Use: Yes   No Known Allergies Family History  Problem Relation Age of Onset  . Breast cancer Other   . Cervical cancer Other   . Colon cancer Other   . Hypertension Other   . Liver disease Sister   . Diabetes Father   . Pancreatic cancer Sister 42        Past medical history, social, surgical and family history all reviewed in electronic medical record.   Review of Systems: No headache, visual changes, nausea, vomiting, diarrhea, constipation, dizziness,  abdominal pain, skin rash, fevers, chills, night sweats, weight loss, swollen lymph nodes, body aches, joint swelling, muscle aches, chest pain, shortness of breath, mood changes.   Objective Blood pressure 116/76, pulse 68, weight 238 lb (107.956 kg).  General: No apparent distress alert and oriented x3 mood and affect normal, dressed appropriately.  HEENT: Pupils equal, extraocular movements intact  Respiratory: Patient's speak in full sentences and does not appear short of breath  Cardiovascular: No lower extremity edema, non tender, no erythema  Skin: Warm dry intact with no signs of infection or rash on extremities or on axial skeleton.  Abdomen: Soft nontender  Neuro: Cranial nerves II through XII are intact, neurovascularly intact in all extremities with 2+ DTRs and 2+ pulses.  Lymph: No lymphadenopathy of posterior or anterior cervical chain or axillae bilaterally.  Gait normal with good balance and coordination.  MSK:  Non tender with full range of motion and good stability and symmetric strength and tone of shoulders, elbows, wrist, hip, and ankles bilaterally.  Knee: Right Normal to inspection with no erythema or effusion or obvious bony abnormalities. Tender over the patellar tendon and inferior aspect of the patellar bone itself ROM full in flexion and extension and lower leg rotation. Ligaments with solid consistent endpoints including ACL, PCL, LCL, MCL. Negative Mcmurray's, Apley's, and Thessalonian  tests. Mild painful patellar compression. Patellar glide with minimal crepitus. Patellar and quadriceps tendons unremarkable. Hamstring and quadriceps strength is normal.   MSK US performed of: Right knee This study was ordered, performed, and interpreted by Charlann Boxer D.O.  Knee: All structures visualized. Anteromedial, anterolateral, posteromedial, and posterolateral menisci unremarkable without tearing, fraying, effusion, or displacement. Patellar tendon has some mild  hypoechoic changes at its origin on the inferior aspect of the patella. Increasing Doppler flow noted. Infrapatellar bursitis noted. No abnormality of prepatellar bursa. LCL and MCL unremarkable on long and transverse views. No abnormality of origin of medial or lateral head of the gastrocnemius.  IMPRESSION:  Patellar tendinitis with infrapatellar bursitis    procedure note 10211; 15 minutes spent for Therapeutic exercises as stated in above notes.  This included exercises focusing on stretching, patient given exercises for quadriceps strengthening and vastus medialis oblique specifically. Discuss hamstring exercises and stretching and showed what activities to potentially avoid concentrating on eccentric exercises. strengthening, with significant focus on eccentric aspects.   Proper technique shown and discussed handout in great detail with ATC.  All questions were discussed and answered.   Impression and Recommendations:     This case required medical decision making of moderate complexity.

## 2015-08-04 NOTE — Patient Instructions (Signed)
Good to see you.  Ice 20 minutes 2 times daily. Usually after activity and before bed. Exercises 3 times a week.  pennsaid pinkie amount topically 2 times daily as needed.  Avoid direct contact on the knee Look for a patellar strap.  Talk to other Doc about the swelling and consider staying at higher dose of the thyroid medicine See me again in 3-4 weeks.

## 2015-08-04 NOTE — Assessment & Plan Note (Signed)
Patient does have more of a patellar tendinitis. Patient given home exercises and icing protocol. We discussed topical anti-inflammatory's and prescription was given. Discussed which activities potentially avoid. Patient will try these different treatment options and come back and see me again in 3-4 weeks. Continuing have pain we may want to consider nitroglycerin patches and we'll discuss at follow-up.

## 2015-08-29 ENCOUNTER — Ambulatory Visit (INDEPENDENT_AMBULATORY_CARE_PROVIDER_SITE_OTHER): Payer: BLUE CROSS/BLUE SHIELD | Admitting: Family Medicine

## 2015-08-29 ENCOUNTER — Encounter: Payer: Self-pay | Admitting: Family Medicine

## 2015-08-29 VITALS — BP 100/72 | HR 66 | Ht 77.0 in | Wt 241.0 lb

## 2015-08-29 DIAGNOSIS — M7651 Patellar tendinitis, right knee: Secondary | ICD-10-CM

## 2015-08-29 MED ORDER — NITROGLYCERIN 0.2 MG/HR TD PT24: MEDICATED_PATCH | TRANSDERMAL | Status: DC

## 2015-08-29 NOTE — Progress Notes (Signed)
Pre visit review using our clinic review tool, if applicable. No additional management support is needed unless otherwise documented below in the visit note. 

## 2015-08-29 NOTE — Assessment & Plan Note (Signed)
Patient seems to be improving but slowly. We are going to start night sweats or patch to aid in the healing process. We discussed icing regimen and home exercises. We discussed which activities to do in which ones to avoid. We discussed with driving he should take to our interval breaks that could be beneficial. Patient will come back and see me again in 4-6 weeks for further evaluation and treatment.

## 2015-08-29 NOTE — Progress Notes (Signed)
[facility] [place], [place] [address] Phone: [phone] Subjective:    CC: Right knee pain follow up  VEH:Elijah Ashley is a 49 y.o. male coming in with complaint of right knee pain.   Patient was seen previously and had more of a calcific patellar tendinitis with an underlying bursitis. Patient states that he is feeling approximately 30% better. Doing the exercises fairly regularly as well as the topical anti-inflammatory. Patient states that he has noticed more popping sensations but this is significantly less painful. Denies any radiation down the leg or any giving out on him. Still states it is in a position for a long amount of time or drives a long amount of time and still seems to be the most comfortable. Avoiding any jumping at this time. Sleeping comfortably.   Past Medical History  Diagnosis Date  . HYPOTHYROIDISM     postsurgical  . LOW BACK PAIN     L4-5 degen disc on MRI - s/p ESI 09/2010  . URINARY RETENTION   . 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 Glaucoma (increased eye pressure)   . Psoriasis   . Anxiety    Past Surgical History  Procedure Laterality Date  . Total thyroidectomy  03/2009    graves and cold nodule (papillary ca)  . Cervical laminectomy  07/2007    C5-6 ACDF due to myelopathy   Social History  Substance Use Topics  . Smoking status: Never Smoker   . Smokeless tobacco: None  . Alcohol Use: Yes   No Known Allergies Family History  Problem Relation Age of Onset  . Breast cancer Other   . Cervical cancer Other   . Colon cancer Other   . Hypertension Other   . Liver disease Sister   . Diabetes Father   . Pancreatic cancer Sister 16        Past medical history, social, surgical and family history all reviewed in electronic medical record.   Review of Systems: No headache, visual changes, nausea, vomiting, diarrhea,  constipation, dizziness, abdominal pain, skin rash, fevers, chills, night sweats, weight loss, swollen lymph nodes, body aches, joint swelling, muscle aches, chest pain, shortness of breath, mood changes.   Objective Blood pressure 100/72, pulse 66, height 6\' 5"  (1.956 m), weight 241 lb (109.317 kg), SpO2 97 %.  General: No apparent distress alert and oriented x3 mood and affect normal, dressed appropriately.  HEENT: Pupils equal, extraocular movements intact  Respiratory: Patient's speak in full sentences and does not appear short of breath  Cardiovascular: No lower extremity edema, non tender, no erythema  Skin: Warm dry intact with no signs of infection or rash on extremities or on axial skeleton.  Abdomen: Soft nontender  Neuro: Cranial nerves II through XII are intact, neurovascularly intact in all extremities with 2+ DTRs and 2+ pulses.  Lymph: No lymphadenopathy of posterior or anterior cervical chain or axillae bilaterally.  Gait normal with good balance and coordination.  MSK:  Non tender with full range of motion and good stability and symmetric strength and tone of shoulders, elbows, wrist, hip, and ankles bilaterally.  Knee: Right Normal to inspection with no erythema or effusion or obvious bony abnormalities. Minimal tenderness still on the inferior aspect of the patella ROM full in flexion and extension and lower leg rotation. Ligaments with solid consistent endpoints including ACL, PCL, LCL, MCL. Negative Mcmurray's, Apley's, and  Thessalonian tests. Mild painful patellar compression with minimal improvement from previous exam Patellar glide with minimal crepitus. Patellar and quadriceps tendons unremarkable. Hamstring and quadriceps strength is normal.  Contralateral knee unremarkable  MSK US performed of: Right knee This study was ordered, performed, and interpreted by Charlann Boxer D.O.  Knee: All structures visualized. Anteromedial, anterolateral, posteromedial, and  posterolateral menisci unremarkable without tearing, fraying, effusion, or displacement. Patellar tendon has significant decrease in hypoechoic changes from previous exam. Still some mild thickening at the origin. Mild increase in Doppler flow still noted. Bursitis improved No abnormality of prepatellar bursa. LCL and MCL unremarkable on long and transverse views. No abnormality of origin of medial or lateral head of the gastrocnemius.  IMPRESSION:  Improving patella tendinitis      Impression and Recommendations:     This case required medical decision making of moderate complexity.

## 2015-08-29 NOTE — Patient Instructions (Signed)
Good to see you Ice 20 minutes 2 times daily. Usually after activity and before bed. Nitroglycerin Protocol   Apply 1/4 nitroglycerin patch to affected area daily.  Change position of patch within the affected area every 24 hours.  You may experience a headache during the first 1-2 weeks of using the patch, these should subside.  If you experience headaches after beginning nitroglycerin patch treatment, you may take your preferred over the counter pain reliever.  Another side effect of the nitroglycerin patch is skin irritation or rash related to patch adhesive.  Please notify our office if you develop more severe headaches or rash, and stop the patch.  Tendon healing with nitroglycerin patch may require 12 to 24 weeks depending on the extent of injury.  Men should not use if taking Viagra, Cialis, or Levitra.   Do not use if you have migraines or rosacea.   Vitamin D 2000 IU daily Turmeric 500mg  twice daily Consider every 2 hours taking a break from driving.  See me again in 4 weeks.

## 2015-09-22 ENCOUNTER — Emergency Department (HOSPITAL_COMMUNITY)
Admission: EM | Admit: 2015-09-22 | Discharge: 2015-09-23 | Disposition: A | Discharge status: Home / Self Care | Payer: BLUE CROSS/BLUE SHIELD | Attending: Emergency Medicine | Admitting: Emergency Medicine

## 2015-09-22 ENCOUNTER — Emergency Department (HOSPITAL_COMMUNITY): Payer: BLUE CROSS/BLUE SHIELD

## 2015-09-22 ENCOUNTER — Encounter (HOSPITAL_COMMUNITY): Payer: Self-pay | Admitting: Emergency Medicine

## 2015-09-22 DIAGNOSIS — G8929 Other chronic pain: Secondary | ICD-10-CM | POA: Insufficient documentation

## 2015-09-22 DIAGNOSIS — H409 Unspecified glaucoma: Secondary | ICD-10-CM | POA: Insufficient documentation

## 2015-09-22 DIAGNOSIS — Z79899 Other long term (current) drug therapy: Secondary | ICD-10-CM | POA: Insufficient documentation

## 2015-09-22 DIAGNOSIS — F419 Anxiety disorder, unspecified: Secondary | ICD-10-CM | POA: Diagnosis not present

## 2015-09-22 DIAGNOSIS — E039 Hypothyroidism, unspecified: Secondary | ICD-10-CM | POA: Diagnosis not present

## 2015-09-22 DIAGNOSIS — Z872 Personal history of diseases of the skin and subcutaneous tissue: Secondary | ICD-10-CM | POA: Insufficient documentation

## 2015-09-22 DIAGNOSIS — M5126 Other intervertebral disc displacement, lumbar region: Secondary | ICD-10-CM

## 2015-09-22 DIAGNOSIS — K458 Other specified abdominal hernia without obstruction or gangrene: Secondary | ICD-10-CM | POA: Diagnosis not present

## 2015-09-22 DIAGNOSIS — M545 Low back pain, unspecified: Secondary | ICD-10-CM

## 2015-09-22 DIAGNOSIS — Z791 Long term (current) use of non-steroidal anti-inflammatories (NSAID): Secondary | ICD-10-CM | POA: Insufficient documentation

## 2015-09-22 DIAGNOSIS — Z85850 Personal history of malignant neoplasm of thyroid: Secondary | ICD-10-CM | POA: Diagnosis not present

## 2015-09-22 LAB — CBC WITH DIFFERENTIAL/PLATELET
Basophils Absolute: 0 K/uL (ref 0.0–0.1)
Basophils Relative: 1 %
Eosinophils Absolute: 0.2 K/uL (ref 0.0–0.7)
Eosinophils Relative: 3 %
HCT: 41.7 % (ref 39.0–52.0)
Hemoglobin: 14.1 g/dL (ref 13.0–17.0)
Lymphocytes Relative: 55 %
Lymphs Abs: 3.6 K/uL (ref 0.7–4.0)
MCH: 30.2 pg (ref 26.0–34.0)
MCHC: 33.8 g/dL (ref 30.0–36.0)
MCV: 89.3 fL (ref 78.0–100.0)
Monocytes Absolute: 0.5 K/uL (ref 0.1–1.0)
Monocytes Relative: 7 %
Neutro Abs: 2.3 K/uL (ref 1.7–7.7)
Neutrophils Relative %: 34 %
Platelets: 134 K/uL — ABNORMAL LOW (ref 150–400)
RBC: 4.67 MIL/uL (ref 4.22–5.81)
RDW: 13.6 % (ref 11.5–15.5)
WBC: 6.6 K/uL (ref 4.0–10.5)

## 2015-09-22 LAB — BASIC METABOLIC PANEL WITH GFR
Anion gap: 6 (ref 5–15)
BUN: 15 mg/dL (ref 6–20)
CO2: 27 mmol/L (ref 22–32)
Calcium: 9.2 mg/dL (ref 8.9–10.3)
Chloride: 103 mmol/L (ref 101–111)
Creatinine, Ser: 1.02 mg/dL (ref 0.61–1.24)
GFR calc Af Amer: >60 mL/min
GFR calc non Af Amer: >60 mL/min
Glucose, Bld: 85 mg/dL (ref 65–99)
Potassium: 4.4 mmol/L (ref 3.5–5.1)
Sodium: 136 mmol/L (ref 135–145)

## 2015-09-22 MED ORDER — MORPHINE SULFATE (PF) 4 MG/ML IV SOLN
4.0000 mg | Freq: Once | INTRAVENOUS | Status: AC
  Administered 2015-09-22: 4 mg via INTRAVENOUS
  Filled 2015-09-22: qty 1

## 2015-09-22 MED ORDER — OXYCODONE-ACETAMINOPHEN 5-325 MG PO TABS
1.0000 | ORAL_TABLET | Freq: Once | ORAL | Status: AC
  Administered 2015-09-22: 1 via ORAL
  Filled 2015-09-22: qty 1

## 2015-09-22 MED ORDER — GADOBENATE DIMEGLUMINE 529 MG/ML IV SOLN
20.0000 mL | Freq: Once | INTRAVENOUS | Status: AC | PRN
  Administered 2015-09-22: 20 mL via INTRAVENOUS

## 2015-09-22 NOTE — ED Notes (Signed)
Pt. reports low back pain while carrying a heavy box at work ( [employer] ) this evening , pain increases with movement  or changing positions .

## 2015-09-22 NOTE — ED Notes (Signed)
Off unit with MRI 

## 2015-09-22 NOTE — ED Provider Notes (Signed)
CSN: [id number]   Arrival date & time 09/22/15 2020  History  By signing my name below, I, Elijah Ashley, attest that this documentation has been prepared under the direction and in the presence of [employer]. Electronically Signed: [name], ED Scribe. 09/22/2015. 9:12 PM. Chief Complaint  Patient presents with  . Back Pain    HPI The history is provided by the patient. No language interpreter was used.   Elijah Ashley is a 49 y.o. male with PMHx of chronic back pain and thyroid carcinoma who presents to the Emergency Department complaining of constant lower back pain with sudden onset around 7 PM while lifting heavy boxes at work.  The pain radiates to the lower abdomen. Standing and movement of the legs exacerbates the pain. Leaning forward and sitting improves the pain. Pt states that he is able to walk slowly but is unsteady on his feet. This pain is different than his chronic back pain. He took nothing for pain PTA. Associated symptoms include tingling in the toes bilaterally, bilateral weakness in his lower extremities. Pt denies tingling up the leg, loss of sensation, bowel/bladder incontinence, perineal pain. No trauma.  Past Medical History  Diagnosis Date  . HYPOTHYROIDISM     postsurgical  . LOW BACK PAIN     L4-5 degen disc on MRI - s/p ESI 09/2010  . URINARY RETENTION   . Papillary thyroid carcinoma ([place]) 06/16/09 dx    total thyroidectomy for cold nodule & Graves disease  . Glaucoma (increased eye pressure)   . Psoriasis   . Anxiety     Past Surgical History  Procedure Laterality Date  . Total thyroidectomy  03/2009    graves and cold nodule (papillary ca)  . Cervical laminectomy  07/2007    C5-6 ACDF due to myelopathy    Family History  Problem Relation Age of Onset  . Breast cancer Other   . Cervical cancer Other   . Colon cancer Other   . Hypertension Other   . Liver disease Sister   . Diabetes Father   . Pancreatic cancer Sister 10     Social History  Substance Use Topics  . Smoking status: Never Smoker   . Smokeless tobacco: None  . Alcohol Use: Yes     Review of Systems 10 Systems reviewed and all are negative for acute change except as noted in the HPI. Home Medications   Prior to Admission medications   Medication Sig Start Date End Date Taking? Authorizing Provider  clonazePAM (KLONOPIN) 0.5 MG tablet Take 1/2-1 tablet by mouth as needed 10/16/12   Historical Provider, MD  Diclofenac Sodium 2 % SOLN Apply 1 pump twice daily. 08/04/15   Lyndal Pulley, DO  divalproex (DEPAKOTE) 500 MG DR tablet Take 1 tablet (500 mg total) by mouth 2 (two) times daily. 07/06/14   Rowe Clack, MD  dorzolamide-timolol (COSOPT) 22.3-6.8 MG/ML ophthalmic solution Place 1 drop into both eyes 2 (two) times daily.      Historical Provider, MD  Glucosamine-Chondroit-Vit C-Mn (GLUCOSAMINE 1500 COMPLEX PO) Take by mouth as needed.    Historical Provider, MD  latanoprost (XALATAN) 0.005 % ophthalmic solution Place 1 drop into both eyes at bedtime. 02/24/12   Renato Shin, MD  levothyroxine (SYNTHROID, LEVOTHROID) 200 MCG tablet Take 200 mcg by mouth daily.    Historical Provider, MD  Lurasidone HCl 60 MG TABS Take 60 mg by mouth daily. 07/06/14   Rowe Clack, MD  nitroGLYCERIN (NITRODUR -  DOSED IN MG/24 HR) 0.2 mg/hr patch 1/4 patch daily 08/29/15   Lyndal Pulley, DO  Omega-3 Fatty Acids (FISH OIL) 1000 MG CAPS Take by mouth daily.    Historical Provider, MD  omeprazole (PRILOSEC) 20 MG capsule Take 1 capsule (20 mg total) by mouth daily. 12/01/14   Rowe Clack, MD  traZODone (DESYREL) 50 MG tablet TAKE 1/2 TO 1 TABLET BY MOUTH AT BEDTIME AS NEEDED FOR SLEEP 08/26/14   Rowe Clack, MD  traZODone (DESYREL) 50 MG tablet Take 0.5-1 tablets (25-50 mg total) by mouth at bedtime as needed for sleep. 12/05/14   Rowe Clack, MD    Allergies  Review of patient's allergies indicates no known allergies.  Triage Vitals:  BP 111/75 mmHg  Pulse 63  Temp(Src) 97.6 F (36.4 C) (Oral)  Resp 18  SpO2 96%  Physical Exam  Constitutional: He is oriented to person, place, and time. He appears well-developed and well-nourished. No distress.  HENT:  Head: Normocephalic and atraumatic.  Eyes: Conjunctivae are normal. Right eye exhibits no discharge. Left eye exhibits no discharge. No scleral icterus.  Neck: Normal range of motion. Neck supple.  Cardiovascular: Normal rate, normal heart sounds and intact distal pulses.  Exam reveals no gallop and no friction rub.   No murmur heard. Pulmonary/Chest: Effort normal. No respiratory distress. He has no wheezes. He has no rales. He exhibits no tenderness.  Abdominal: Soft. He exhibits no distension and no mass. There is no tenderness. There is no rebound and no guarding.  Musculoskeletal:  No midline spinal tenderness or paraspinal muscle tenderness. Significant low back pain felt with standing and lifting of legs. +SLR.  Lymphadenopathy:    He has no cervical adenopathy.  Neurological: He is alert and oriented to person, place, and time. No cranial nerve deficit. Coordination normal.  Significant low back pain felt with extension of the knee. Strength decreased with extension of the knee 3/5. Minimal pain felt with flexion of knee joint. Strength 5/5 with dorsiflexion and plantar flexion bilaterally. No sensory deficits.  Unsteady gait. Significant pain felt with standing. Strength 5/5 in BUEs.    Skin: Skin is warm and dry. No rash noted. He is not diaphoretic. No erythema. No pallor.  Psychiatric: He has a normal mood and affect. His behavior is normal.  Nursing note and vitals reviewed.   ED Course  Procedures  DIAGNOSTIC STUDIES: Oxygen Saturation is 96% on RA,  normal by my interpretation.    COORDINATION OF CARE: 8:48 PM Discussed treatment plan which includes lumbar spine XR and pain management with pt at bedside and pt agreed to the plan.  Labs  Review-  Labs Reviewed  CBC WITH DIFFERENTIAL/PLATELET - Abnormal; Notable for the following:    Platelets 134 (*)    All other components within normal limits  BASIC METABOLIC PANEL    Imaging Review Mr Lumbar Spine W Wo Contrast  09/22/2015   CLINICAL DATA:  Chronic low back pain; acute exacerbation while lifting heavy boxes at work this evening. Bilateral lower extremity weakness. Pain radiates to lower abdomen. History of thyroid carcinoma.  EXAM: MRI LUMBAR SPINE WITHOUT AND WITH CONTRAST  TECHNIQUE: Multiplanar and multiecho pulse sequences of the lumbar spine were obtained without and with intravenous contrast.  CONTRAST:  6mL MULTIHANCE GADOBENATE DIMEGLUMINE 529 MG/ML IV SOLN  COMPARISON:  CT abdomen pelvis November 16, 2013  FINDINGS: Lumbar vertebral bodies and posterior elements are intact and aligned with maintenance of lumbar lordosis. Using the  reference level of the last well-formed intervertebral disc as L5-S1, mild L4-5 disc height loss and decreased T2 signal within the disc consistent with desiccation. The remaining lumbar discs demonstrate normal morphology and signal characteristics. No STIR signal abnormality to suggest acute osseous process. No abnormal osseous or intradiscal enhancement.  Conus medullaris terminates at T12-L1 and appears normal in morphology and signal characteristics. Cauda equina is unremarkable. No abnormal cord, leptomeningeal nor epidural enhancement. Included prevertebral and paraspinal soft tissues are normal.  Level by level evaluation:  L1-2, L2-3, L3-4: No significant disc bulge, canal stenosis or neural foraminal narrowing.  L4-5: Small central disc protrusion and broad-based disc bulge, LEFT subarticular annular fissure. Mild to moderate facet arthropathy and ligamentum flavum redundancy. Minimal canal stenosis. Minimal bilateral neural foraminal narrowing.  L5-S1: No significant disc bulge. Moderate to severe facet arthropathy. No canal stenosis or  neural foraminal narrowing.  IMPRESSION: Small L4-5 central disc protrusion and broad-based disc bulge with LEFT subarticular annular fissure.  No acute lumbar spine fracture nor malalignment.  Minimal canal stenosis and neural foraminal narrowing at L4-5.   Electronically Signed   By: Elon Alas M.D.   On: 09/22/2015 23:12    MDM   Final diagnoses:  Low back pain  Lumbar herniated disc    Patient was sudden onset back pain, unsteady gait, numbness and tingling in bilateral feet after lifting a heavy box at work around 7 PM. Patient with history of cancer. No bowel or bladder incontinence, no perineal pain. Nontender to palpation of lumbar spine or paraspinal muscles however significant pain felt upon standing and lifting his legs. Concern for herniated disc. Discussed this patient with Dr. Ralene Bathe who agrees patient needs MRI.   MRI reveals small L4-5 central disc protrusion and broad-based disc bulge with left subarticular annular fissure. No acute lumbar spine fracture or malalignment. Minimal canal stenosis and neural foraminal narrowing at L4-5.  Patient given IV Decadron for inflammation. Patient began itching required IV Benadryl.  Patient able to ambulate. Recommend follow-up with orthopedic. Avoid heavy lifting and strenuous activity. Discussed plan with patient and family who are agreeable. Return precautions outlined inpatient discharge instructions.  Patient was discussed with and seen by Dr. Ralene Bathe who agrees with the treatment plan.     I personally performed the services described in this documentation, which was scribed in my presence. The recorded information has been reviewed and is accurate.       Dondra Spry Stevenson, PA-C 09/23/15 0134  Quintella Reichert, MD 09/23/15 [phone]

## 2015-09-23 MED ORDER — OXYCODONE-ACETAMINOPHEN 5-325 MG PO TABS: 2.0000 | ORAL_TABLET | ORAL | Status: DC | PRN

## 2015-09-23 MED ORDER — PREDNISONE 10 MG (21) PO TBPK: 10.0000 mg | ORAL_TABLET | Freq: Every day | ORAL | Status: DC

## 2015-09-23 MED ORDER — DIPHENHYDRAMINE HCL 50 MG/ML IJ SOLN
25.0000 mg | Freq: Once | INTRAMUSCULAR | Status: AC
  Administered 2015-09-23: 25 mg via INTRAVENOUS
  Filled 2015-09-23: qty 1

## 2015-09-23 MED ORDER — DEXAMETHASONE SODIUM PHOSPHATE 10 MG/ML IJ SOLN
10.0000 mg | Freq: Once | INTRAMUSCULAR | Status: AC
  Administered 2015-09-23: 10 mg via INTRAVENOUS
  Filled 2015-09-23: qty 1

## 2015-09-23 NOTE — Discharge Instructions (Signed)
Back Pain, Adult °Back pain is very common in adults. The cause of back pain is rarely dangerous and the pain often gets better over time. The cause of your back pain may not be known. Some common causes of back pain include: °1. Strain of the muscles or ligaments supporting the spine. °2. Wear and tear (degeneration) of the spinal disks. °3. Arthritis. °4. Direct injury to the back. °For many people, back pain may return. Since back pain is rarely dangerous, most people can learn to manage this condition on their own. °HOME CARE INSTRUCTIONS °Watch your back pain for any changes. The following actions may help to lessen any discomfort you are feeling: °1. Remain active. It is stressful on your back to sit or stand in one place for long periods of time. Do not sit, drive, or stand in one place for more than 30 minutes at a time. Take short walks on even surfaces as soon as you are able. Try to increase the length of time you walk each day. °2. Exercise regularly as directed by your health care provider. Exercise helps your back heal faster. It also helps avoid future injury by keeping your muscles strong and flexible. °3. Do not stay in bed. Resting more than 1-2 days can delay your recovery. °4. Pay attention to your body when you bend and lift. The most comfortable positions are those that put less stress on your recovering back. Always use proper lifting techniques, including: °1. Bending your knees. °2. Keeping the load close to your body. °3. Avoiding twisting. °5. Find a comfortable position to sleep. Use a firm mattress and lie on your side with your knees slightly bent. If you lie on your back, put a pillow under your knees. °6. Avoid feeling anxious or stressed. Stress increases muscle tension and can worsen back pain. It is important to recognize when you are anxious or stressed and learn ways to manage it, such as with exercise. °7. Take medicines only as directed by your health care provider.  Over-the-counter medicines to reduce pain and inflammation are often the most helpful. Your health care provider may prescribe muscle relaxant drugs. These medicines help dull your pain so you can more quickly return to your normal activities and healthy exercise. °8. Apply ice to the injured area: °1. Put ice in a plastic bag. °2. Place a towel between your skin and the bag. °3. Leave the ice on for 20 minutes, 2-3 times a day for the first 2-3 days. After that, ice and heat may be alternated to reduce pain and spasms. °9. Maintain a healthy weight. Excess weight puts extra stress on your back and makes it difficult to maintain good posture. °SEEK MEDICAL CARE IF: °1. You have pain that is not relieved with rest or medicine. °2. You have increasing pain going down into the legs or buttocks. °3. You have pain that does not improve in one week. °4. You have night pain. °5. You lose weight. °6. You have a fever or chills. °SEEK IMMEDIATE MEDICAL CARE IF:  °1. You develop new bowel or bladder control problems. °2. You have unusual weakness or numbness in your arms or legs. °3. You develop nausea or vomiting. °4. You develop abdominal pain. °5. You feel faint. °  °This information is not intended to replace advice given to you by your health care provider. Make sure you discuss any questions you have with your health care provider. °  °Document Released: 12/02/2005 Document Revised: 12/23/2014 Document Reviewed: 04/05/2014 °Elsevier Interactive Patient Education ©2016 [employer]  Herniated Disk A herniated disk occurs when a disk in your spine bulges out too far. This condition is also called a ruptured disk or slipped disk. Your spine (backbone) is made up of bones called vertebrae. Between each pair of vertebrae is an oval disk with a soft, spongy center that acts as a shock absorber when you move. The spongy center is surrounded by a tough outer ring. When you have a herniated disk, the spongy center of the  disk bulges out or ruptures through the outer ring. A herniated disk can press on a nerve between your vertebrae and cause pain. A herniated disk can occur anywhere in your back or neck area, but the lower back is the most common spot. CAUSES  In many cases, a herniated disk occurs just from getting older. As you age, the spongy insides of your disks tend to shrink and dry out. A herniated disk can result from gradual wear and tear. Injury or sudden strain can also cause a herniated disk.  RISK FACTORS Aging is the main risk factor for a herniated disk. Other risk factors include: 5. Being a man between the ages of 2 and 58 years. 6. Having a job that requires heavy lifting, bending, or twisting. 7. Having a job that requires long hours of driving. 8. Not getting enough exercise. 24. Being overweight. 10. Smoking. SIGNS AND SYMPTOMS  Signs and symptoms depend on which disk is herniated. 10. For a herniated disk in the lower back, you may have sharp pain in: 1. One part of your leg, hip, or buttocks. 2. The back of your calf. 3. The top or sole of your foot (sciatica).  11. For a herniated disk in the neck, you may feel pain: 1. When you move your neck. 2. Near or over your shoulder blade. 3. That moves to your upper arm, forearm, or fingers.  12. You may also have muscle weakness. It may be hard to: 1. Lift your leg or arm. 2. Stand on your toes. 3. Squeeze tightly with one of your hands. 13. Other symptoms can include: 1. Numbness or tingling in the affected areas of your body. 2. Loss of bladder or bowel control. This is a rare but serious sign of a severe herniated disk in the lower back. DIAGNOSIS  Your health care provider will do a physical exam. During this exam, you may have to move certain body parts or assume various positions. For example, your health care provider may do the straight-leg test. This is a good way to test for a herniated disk in your lower back. In this test,  the health care provider lifts your leg while you lie on your back. This is to see if you feel pain down your leg. Your health care provider will also check for numbness or loss of feeling. 7. Your health care provider will also check your: 1. Reflexes. 2. Muscle strength. 3. Posture. 8. Other tests may be done to help in making a diagnosis. These may include: 1. An X-ray of the spine to rule out other causes of back pain.  2. Other imaging studies, such as an MRI or CT scan. This is to check whether the herniated disk is pressing on your spinal canal. 3. Electromyography (EMG). This test checks the nerves that control muscles. It is sometimes used to identify the specific area of nerve involvement.  TREATMENT  In many cases, herniated disk symptoms go away over a period of days or weeks.  You will most likely be free of symptoms in 3-4 months. Treatment may include the following: 6. The initial treatment for a herniated disk is ashort period of rest. 1. Bed rest is often limited to 1 or 2 days. Resting for too long delays recovery. 2. If you have a herniated disk in your lower back, you should avoid sitting as much as possible because sitting increases pressure on the disk. 7. Medicines. These may include:  1. Nonsteroidal anti-inflammatory drugs (NSAIDs). 2. Muscle relaxants for back spasms. 3. Narcotic pain medicine if your pain is very bad.  8. Steroid injections. You may need these along the involved nerve root to help control pain. The steroid is injected in the area of the herniated disk. It helps by reducing swelling around the disk. 9. Physical therapy. This may include exercises to strengthen the muscles that help support your spine.  10. You may need surgery if other treatments do not work.  HOME CARE INSTRUCTIONS Follow all your health care provider's instructions. These may include: 1. Take all medicines as directed by your health care provider. 2. Rest for 2 days and then  start moving. 1. Do not sit or stand for long periods of time. 2. Maintain good posture when sitting and standing. 3. Avoid movements that cause pain, such as bending or lifting. 3. When you are able to start lifting things again: 1. Bend with your knees. 2. Keep your back straight. 3. Hold heavy objects close to your body. 4. If you are overweight, ask your health care provider to help you start a weight-loss program. 5. When you are able to start exercising, ask your health care provider how much and what type of exercise is best for you. 6. Work with a physical therapist on stretching and strengthening exercises for your back. 7. Do not wear high-heeled shoes. 8. Do not sleep on your belly. 9. Do not smoke. 10. Keep all follow-up visits as directed by your health care provider. SEEK MEDICAL CARE IF: 1. You have back or neck pain that is not getting better after 4 weeks. 2. You have very bad pain in your back or neck. 3. You develop numbness, tingling, or weakness along with pain. SEEK IMMEDIATE MEDICAL CARE IF:  1. You have numbness, tingling, or weakness that makes you unable to use your arms or legs. 2. You lose control of your bladder or bowels. 3. You have dizziness or fainting. 4. You have shortness of breath.  MAKE SURE YOU:   Understand these instructions.  Will watch your condition.  Will get help right away if you are not doing well or get worse.   This information is not intended to replace advice given to you by your health care provider. Make sure you discuss any questions you have with your health care provider.   Follow-up with orthopedic as soon as possible for evaluation of herniated disc. Avoid heavy lifting or strenuous exercise size. Take steroids and pain medication as prescribed. Turn to the emergency department a few experience numbness or tingling in both extremities, bowel or bladder incontinence, worsening of your symptoms, vomiting, constipation. Back  Exercises The following exercises strengthen the muscles that help to support the back. They also help to keep the lower back flexible. Doing these exercises can help to prevent back pain or lessen existing pain. If you have back pain or discomfort, try doing these exercises 2-3 times each day or as told by your health care provider. When the pain goes  away, do them once each day, but increase the number of times that you repeat the steps for each exercise (do more repetitions). If you do not have back pain or discomfort, do these exercises once each day or as told by your health care provider. EXERCISES Single Knee to Chest Repeat these steps 3-5 times for each leg: 11. Lie on your back on a firm bed or the floor with your legs extended. 12. Bring one knee to your chest. Your other leg should stay extended and in contact with the floor. 30. Hold your knee in place by grabbing your knee or thigh. 14. Pull on your knee until you feel a gentle stretch in your lower back. 15. Hold the stretch for 10-30 seconds. 16. Slowly release and straighten your leg. Pelvic Tilt Repeat these steps 5-10 times: 14. Lie on your back on a firm bed or the floor with your legs extended. [place] your knees so they are pointing toward the ceiling and your feet are flat on the floor. 47. Tighten your lower abdominal muscles to press your lower back against the floor. This motion will tilt your pelvis so your tailbone points up toward the ceiling instead of pointing to your feet or the floor. 17. With gentle tension and even breathing, hold this position for 5-10 seconds. Cat-Cow Repeat these steps until your lower back becomes more flexible: 9. Get into a hands-and-knees position on a firm surface. Keep your hands under your shoulders, and keep your knees under your hips. You may place padding under your knees for comfort. 10. Let your head hang down, and point your tailbone toward the floor so your lower back becomes  rounded like the back of a cat. 11. Hold this position for 5 seconds. 12. Slowly lift your head and point your tailbone up toward the ceiling so your back forms a sagging arch like the back of a cow. 13. Hold this position for 5 seconds. Press-Ups Repeat these steps 5-10 times: 11. Lie on your abdomen (face-down) on the floor. 12. Place your palms near your head, about shoulder-width apart. 13. While you keep your back as relaxed as possible and keep your hips on the floor, slowly straighten your arms to raise the top half of your body and lift your shoulders. Do not use your back muscles to raise your upper torso. You may adjust the placement of your hands to make yourself more comfortable. 14. Hold this position for 5 seconds while you keep your back relaxed. 15. Slowly return to lying flat on the floor. Bridges Repeat these steps 10 times: 11. Lie on your back on a firm surface. 12. Bend your knees so they are pointing toward the ceiling and your feet are flat on the floor. 23. Tighten your buttocks muscles and lift your buttocks off of the floor until your waist is at almost the same height as your knees. You should feel the muscles working in your buttocks and the back of your thighs. If you do not feel these muscles, slide your feet 1-2 inches farther away from your buttocks. 14. Hold this position for 3-5 seconds. 15. Slowly lower your hips to the starting position, and allow your buttocks muscles to relax completely. If this exercise is too easy, try doing it with your arms crossed over your chest. Abdominal Crunches Repeat these steps 5-10 times: 4. Lie on your back on a firm bed or the floor with your legs extended. 5. Bend your knees so they  are pointing toward the ceiling and your feet are flat on the floor. 6. Cross your arms over your chest. 7. Tip your chin slightly toward your chest without bending your neck. 8. Tighten your abdominal muscles and slowly raise your trunk  (torso) high enough to lift your shoulder blades a tiny bit off of the floor. Avoid raising your torso higher than that, because it can put too much stress on your low back and it does not help to strengthen your abdominal muscles. 9. Slowly return to your starting position. Back Lifts Repeat these steps 5-10 times: 5. Lie on your abdomen (face-down) with your arms at your sides, and rest your forehead on the floor. 6. Tighten the muscles in your legs and your buttocks. 7. Slowly lift your chest off of the floor while you keep your hips pressed to the floor. Keep the back of your head in line with the curve in your back. Your eyes should be looking at the floor. 8. Hold this position for 3-5 seconds. 9. Slowly return to your starting position. SEEK MEDICAL CARE IF:  Your back pain or discomfort gets much worse when you do an exercise.  Your back pain or discomfort does not lessen within 2 hours after you exercise. If you have any of these problems, stop doing these exercises right away. Do not do them again unless your health care provider says that you can. SEEK IMMEDIATE MEDICAL CARE IF:  You develop sudden, severe back pain. If this happens, stop doing the exercises right away. Do not do them again unless your health care provider says that you can.   This information is not intended to replace advice given to you by your health care provider. Make sure you discuss any questions you have with your health care provider.   Document Released: 01/09/2005 Document Revised: 08/23/2015 Document Reviewed: 01/26/2015 Elsevier Interactive Patient Education [employer].

## 2015-09-27 ENCOUNTER — Ambulatory Visit: Payer: BLUE CROSS/BLUE SHIELD | Admitting: Family Medicine

## 2016-03-01 IMAGING — MR MR LUMBAR SPINE WO/W CM
4 of 7 series · 18 of 48 positions shown · IV contrast (multihance)
Comparison: CT abdomen pelvis November 16, 2013

CLINICAL DATA: Chronic low back pain; acute exacerbation while
lifting heavy boxes at work this evening. Bilateral lower extremity
weakness. Pain radiates to lower abdomen. History of thyroid
carcinoma.

EXAM:
MRI LUMBAR SPINE WITHOUT AND WITH CONTRAST
TECHNIQUE: Multiplanar and multiecho pulse sequences of the lumbar spine were
obtained without and with intravenous contrast.
CONTRAST:  20mL MULTIHANCE GADOBENATE DIMEGLUMINE 529 MG/ML IV SOLN

[Series 200: T2 · sagittal · 4.0mm · 0.55mm/px · 4 of 15 slices shown (1 of 2)]
[im 1/15]
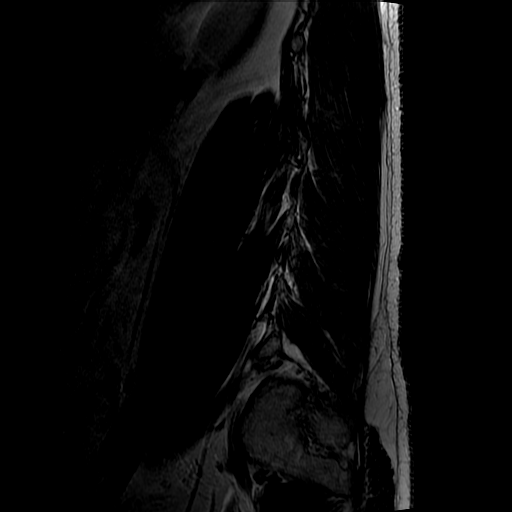
[im 5/15]
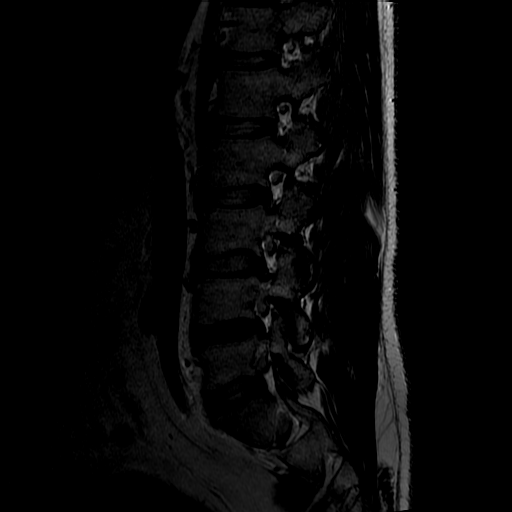
[im 10/15]
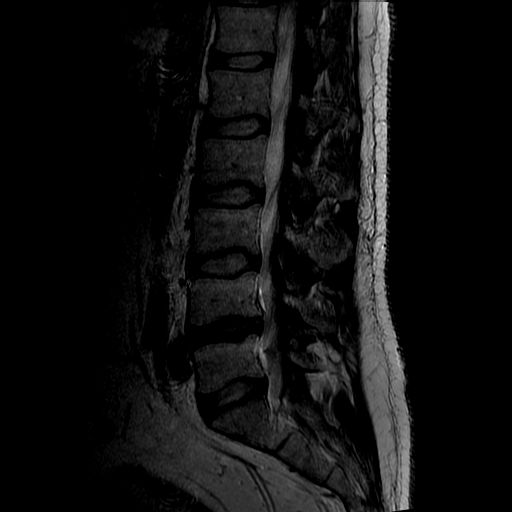
[im 15/15]
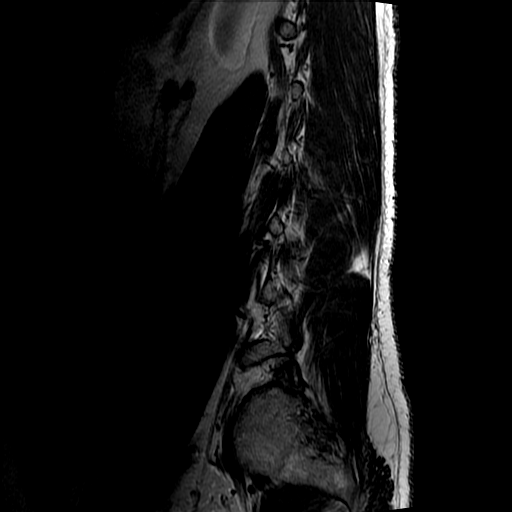

[Series 400: T1 · sagittal · 4.0mm · 0.55mm/px · 3 of 15 slices shown (1 of 2)]
[im 1/15]
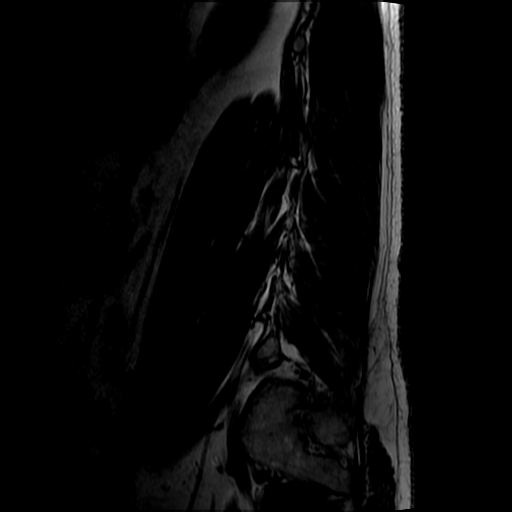
[im 10/15]
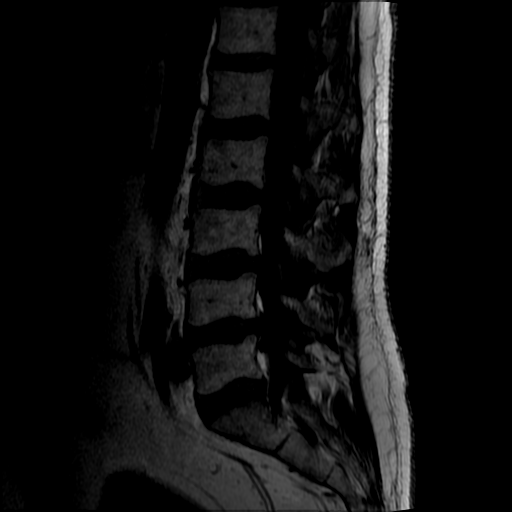
[im 15/15]
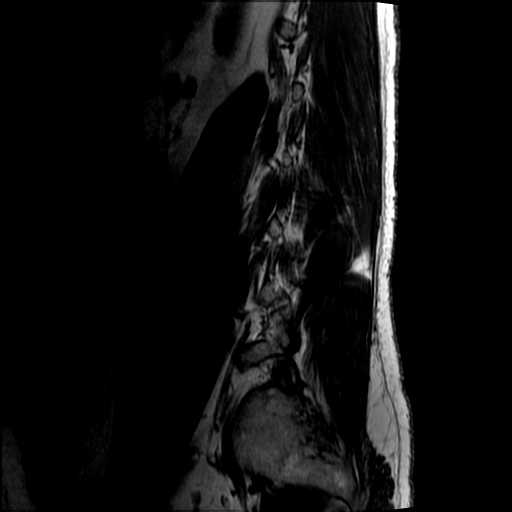

[Series 500: T1 · axial · 4.0mm · 0.39mm/px · z∈[-90,+95]mm · 3 of 40 slices shown (2 of 2)]
[im 4/40]
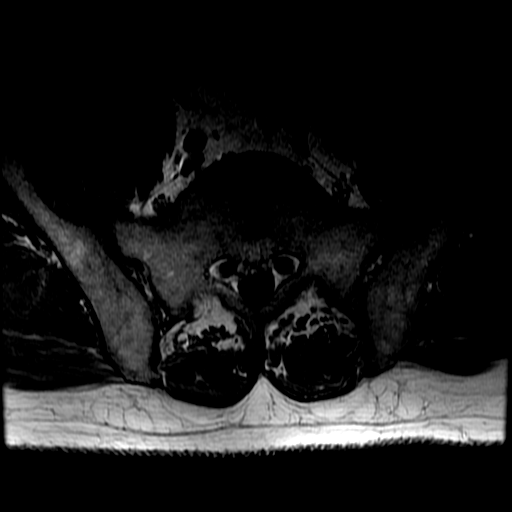
[im 20/40]
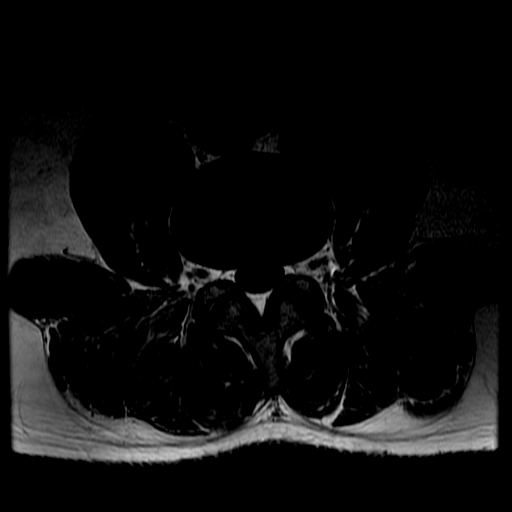
[im 36/40]
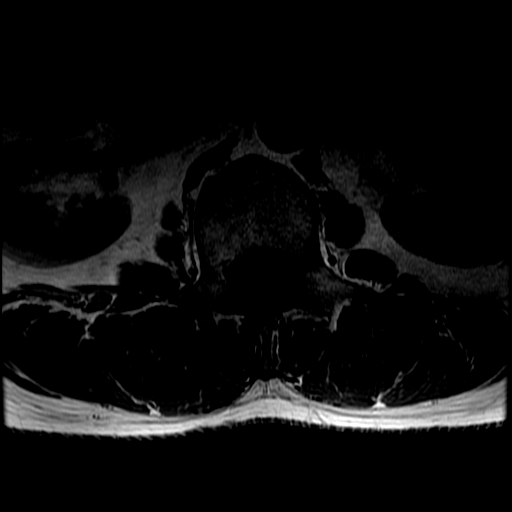

[Series 600: T2 · axial · 4.0mm · 0.39mm/px · z∈[-105,+95]mm · 8 of 40 slices shown (2 of 2)]
[im 1/40]
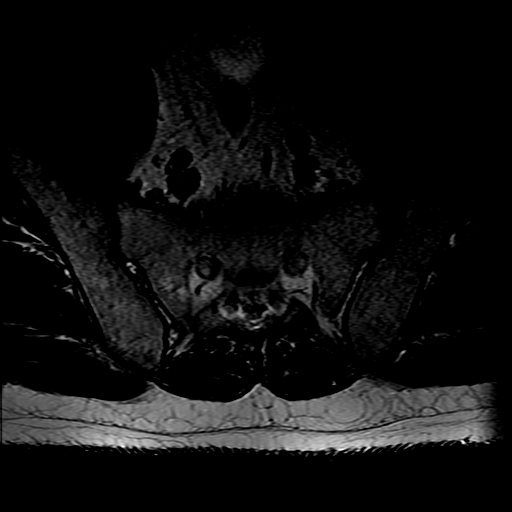
[im 4/40]
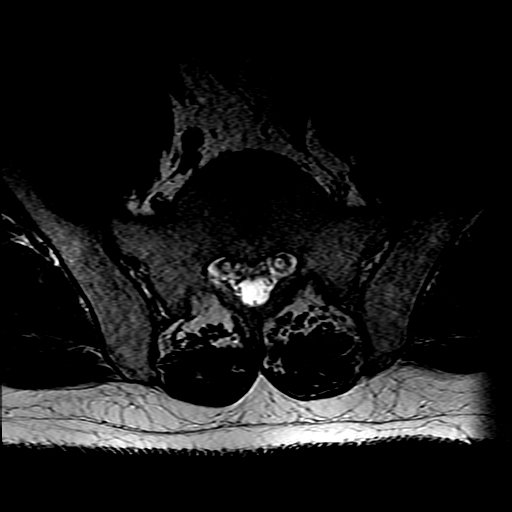
[im 8/40]
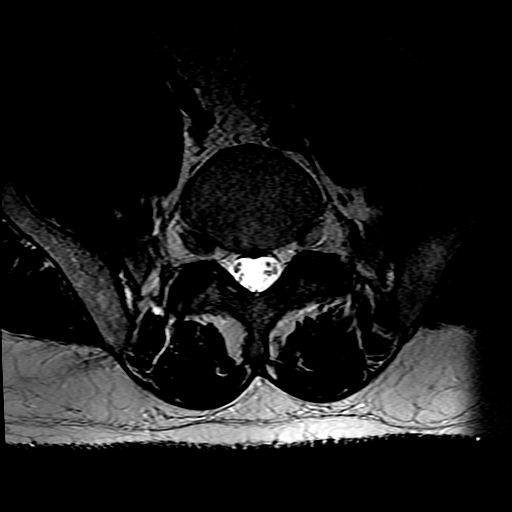
[im 12/40]
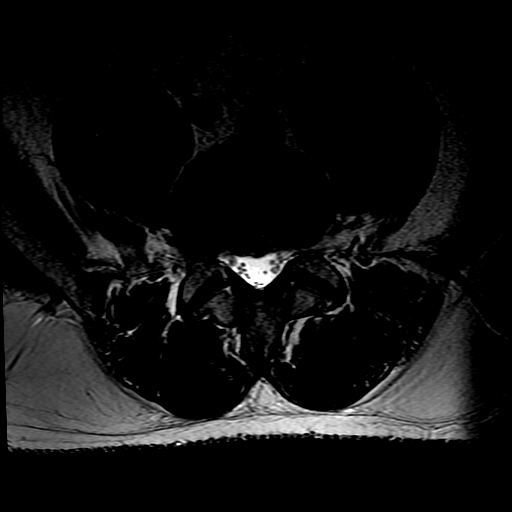
[im 16/40]
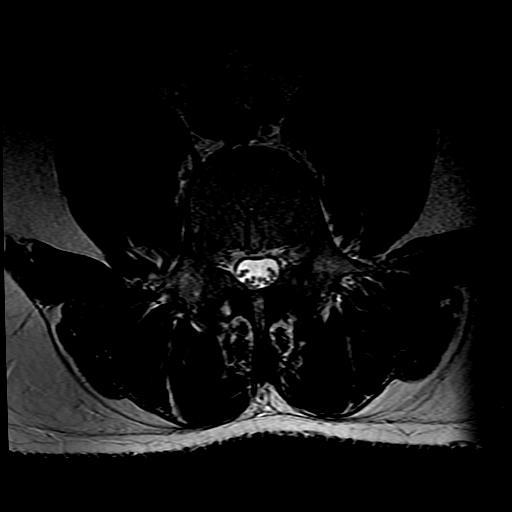
[im 20/40]
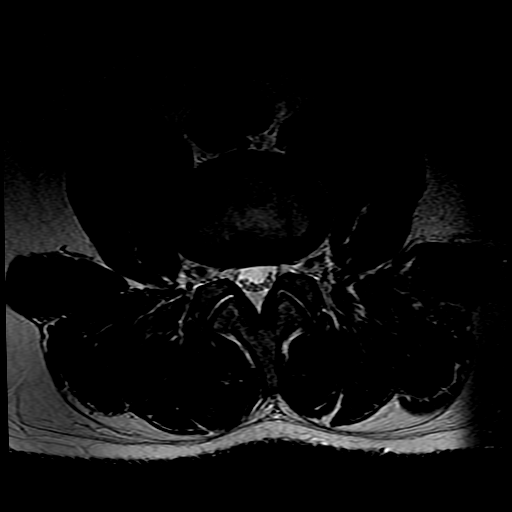
[im 24/40]
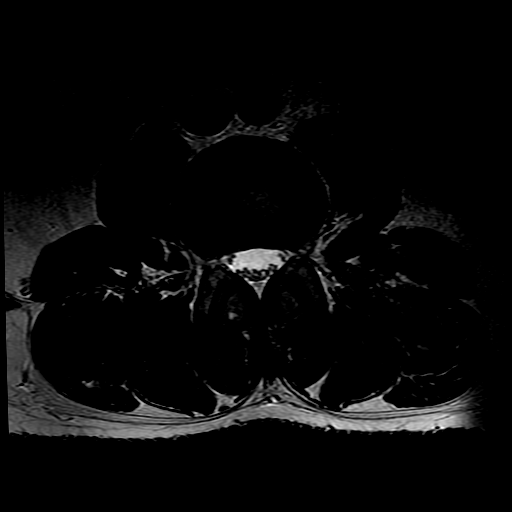
[im 36/40]
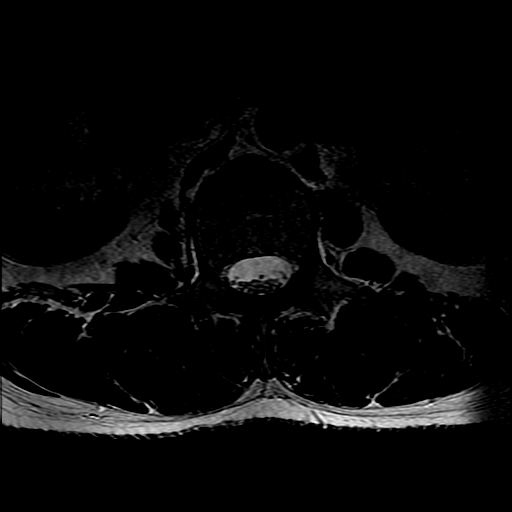

[18 of 48 positions shown; findings below may reference images not displayed]

FINDINGS: Lumbar vertebral bodies and posterior elements are intact and
aligned with maintenance of lumbar lordosis. Using the reference
level of the last well-formed intervertebral disc as L5-S1, mild
L4-5 disc height loss and decreased T2 signal within the disc
consistent with desiccation. The remaining lumbar discs demonstrate
normal morphology and signal characteristics. No STIR signal
abnormality to suggest acute osseous process. No abnormal osseous or
intradiscal enhancement.

Conus medullaris terminates at T12-L1 and appears normal in
morphology and signal characteristics. Cauda equina is unremarkable.
No abnormal cord, leptomeningeal nor epidural enhancement. Included
prevertebral and paraspinal soft tissues are normal.

Level by level evaluation:

L1-2, L2-3, L3-4: No significant disc bulge, canal stenosis or
neural foraminal narrowing.

L4-5: Small central disc protrusion and broad-based disc bulge, LEFT
subarticular annular fissure. Mild to moderate facet arthropathy and
ligamentum flavum redundancy. Minimal canal stenosis. Minimal
bilateral neural foraminal narrowing.

L5-S1: No significant disc bulge. Moderate to severe facet
arthropathy. No canal stenosis or neural foraminal narrowing.
IMPRESSION: Small L4-5 central disc protrusion and broad-based disc bulge with
LEFT subarticular annular fissure.

No acute lumbar spine fracture nor malalignment.

Minimal canal stenosis and neural foraminal narrowing at L4-5.

## 2016-04-30 DIAGNOSIS — H9201 Otalgia, right ear: Secondary | ICD-10-CM | POA: Insufficient documentation

## 2016-07-04 ENCOUNTER — Telehealth: Payer: Self-pay | Admitting: *Deleted

## 2016-07-04 NOTE — Telephone Encounter (Signed)
Unfortunately, I'm not able to accept any more new patients at this time.  I'm sorry! Thank you!  

## 2016-07-04 NOTE — Telephone Encounter (Signed)
Dahlia and patient is requesting Dr. Alain Marion accept him as a new patient since Dr. Asa Lente has left.  Can we schedule him with you?

## 2016-07-10 NOTE — Telephone Encounter (Signed)
Dr. Alain Marion is agreeable to take over patient's care as his PCP.  Dahlia informed.

## 2017-03-20 ENCOUNTER — Encounter: Payer: Self-pay | Admitting: Internal Medicine

## 2017-03-20 ENCOUNTER — Ambulatory Visit (INDEPENDENT_AMBULATORY_CARE_PROVIDER_SITE_OTHER): Payer: BLUE CROSS/BLUE SHIELD | Admitting: Internal Medicine

## 2017-03-20 VITALS — BP 118/72 | HR 69 | Temp 97.7°F | Ht 77.0 in | Wt 241.0 lb

## 2017-03-20 DIAGNOSIS — H409 Unspecified glaucoma: Secondary | ICD-10-CM

## 2017-03-20 DIAGNOSIS — Z8379 Family history of other diseases of the digestive system: Secondary | ICD-10-CM | POA: Insufficient documentation

## 2017-03-20 DIAGNOSIS — F319 Bipolar disorder, unspecified: Secondary | ICD-10-CM | POA: Insufficient documentation

## 2017-03-20 DIAGNOSIS — F419 Anxiety disorder, unspecified: Secondary | ICD-10-CM | POA: Diagnosis not present

## 2017-03-20 DIAGNOSIS — F3130 Bipolar disorder, current episode depressed, mild or moderate severity, unspecified: Secondary | ICD-10-CM

## 2017-03-20 DIAGNOSIS — Z1211 Encounter for screening for malignant neoplasm of colon: Secondary | ICD-10-CM

## 2017-03-20 DIAGNOSIS — E890 Postprocedural hypothyroidism: Secondary | ICD-10-CM

## 2017-03-20 DIAGNOSIS — E663 Overweight: Secondary | ICD-10-CM

## 2017-03-20 MED ORDER — VITAMIN D3 50 MCG (2000 UT) PO CAPS: 2000.0000 [IU] | ORAL_CAPSULE | Freq: Every day | ORAL | 3 refills | Status: AC

## 2017-03-20 MED ORDER — FLUOXETINE HCL 40 MG PO CAPS: 40.0000 mg | ORAL_CAPSULE | Freq: Every day | ORAL | 3 refills | Status: DC

## 2017-03-20 NOTE — Assessment & Plan Note (Addendum)
[facility] Fluoxetine, Klonopin, Trazodone

## 2017-03-20 NOTE — Progress Notes (Signed)
Subjective:  Patient ID: Elijah Ashley, male    DOB: [birth year]  Age: 51 y.o. MRN: [id number]  CC: Establish Care   HPI Elijah Ashley presents for hypothyroidism, thyroid ca, GERD, insomnia, bipolar depression ([facility]).  Two sisters died of colitis. One sister had colon ca. New pt - not seen in >3 years    Outpatient Medications Prior to Visit  Medication Sig Dispense Refill  . clonazePAM (KLONOPIN) 0.5 MG tablet Take 1/2-1 tablet by mouth as needed    . dorzolamide-timolol (COSOPT) 22.3-6.8 MG/ML ophthalmic solution Place 1 drop into both eyes 2 (two) times daily.      . Glucosamine-Chondroit-Vit C-Mn (GLUCOSAMINE 1500 COMPLEX PO) Take by mouth as needed.    . latanoprost (XALATAN) 0.005 % ophthalmic solution Place 1 drop into both eyes at bedtime. 2.5 mL 1  . levothyroxine (SYNTHROID, LEVOTHROID) 200 MCG tablet Take 200 mcg by mouth daily.    . Omega-3 Fatty Acids (FISH OIL) 1000 MG CAPS Take by mouth daily.    Marland Kitchen omeprazole (PRILOSEC) 20 MG capsule Take 1 capsule (20 mg total) by mouth daily. 30 capsule 5  . traZODone (DESYREL) 50 MG tablet Take 0.5-1 tablets (25-50 mg total) by mouth at bedtime as needed for sleep. 30 tablet 2  . Diclofenac Sodium 2 % SOLN Apply 1 pump twice daily. 112 g 3  . divalproex (DEPAKOTE) 500 MG DR tablet Take 1 tablet (500 mg total) by mouth 2 (two) times daily.    . Lurasidone HCl 60 MG TABS Take 60 mg by mouth daily. 30 tablet   . nitroGLYCERIN (NITRODUR - DOSED IN MG/24 HR) 0.2 mg/hr patch 1/4 patch daily 30 patch 1  . oxyCODONE-acetaminophen (PERCOCET/ROXICET) 5-325 MG tablet Take 2 tablets by mouth every 4 (four) hours as needed for severe pain. 15 tablet 0  . predniSONE (STERAPRED UNI-PAK 21 TAB) 10 MG (21) TBPK tablet Take 1 tablet (10 mg total) by mouth daily. Take 6 tabs by mouth daily  for 2 days, then 5 tabs for 2 days, then 4 tabs for 2 days, then 3 tabs for 2 days, 2 tabs for 2 days, then 1 tab by mouth daily for 2 days 42 tablet  0  . traZODone (DESYREL) 50 MG tablet TAKE 1/2 TO 1 TABLET BY MOUTH AT BEDTIME AS NEEDED FOR SLEEP 30 tablet 2   No facility-administered medications prior to visit.     ROS Review of Systems  Constitutional: Positive for unexpected weight change. Negative for appetite change and fatigue.  HENT: Negative for congestion, nosebleeds, sneezing, sore throat and trouble swallowing.   Eyes: Negative for itching and visual disturbance.  Respiratory: Negative for cough.   Cardiovascular: Negative for chest pain, palpitations and leg swelling.  Gastrointestinal: Negative for abdominal distention, blood in stool, diarrhea and nausea.  Genitourinary: Negative for frequency and hematuria.  Musculoskeletal: Negative for back pain, gait problem, joint swelling and neck pain.  Skin: Negative for rash.  Neurological: Negative for dizziness, tremors, speech difficulty and weakness.  Psychiatric/Behavioral: Negative for agitation, dysphoric mood and sleep disturbance. The patient is not nervous/anxious.   Obese abdomen  Objective:  BP 118/72   Pulse 69   Temp 97.7 F (36.5 C)   Ht 6\' 5"  (1.956 m)   Wt 241 lb (109.3 kg)   SpO2 98%   BMI 28.58 kg/m   BP Readings from Last 3 Encounters:  03/20/17 118/72  09/23/15 110/70  08/29/15 100/72    Wt Readings from Last 3  Encounters:  03/20/17 241 lb (109.3 kg)  08/29/15 241 lb (109.3 kg)  08/04/15 238 lb (108 kg)    Physical Exam  Constitutional: He is oriented to person, place, and time. He appears well-developed. No distress.  NAD  HENT:  Mouth/Throat: Oropharynx is clear and moist.  Eyes: Conjunctivae are normal. Pupils are equal, round, and reactive to light.  Neck: Normal range of motion. No JVD present. No thyromegaly present.  Cardiovascular: Normal rate, regular rhythm, normal heart sounds and intact distal pulses.  Exam reveals no gallop and no friction rub.   No murmur heard. Pulmonary/Chest: Effort normal and breath sounds normal.  No respiratory distress. He has no wheezes. He has no rales. He exhibits no tenderness.  Abdominal: Soft. Bowel sounds are normal. He exhibits no distension and no mass. There is no tenderness. There is no rebound and no guarding.  Musculoskeletal: Normal range of motion. He exhibits no edema or tenderness.  Lymphadenopathy:    He has no cervical adenopathy.  Neurological: He is alert and oriented to person, place, and time. He has normal reflexes. No cranial nerve deficit. He exhibits normal muscle tone. He displays a negative Romberg sign. Coordination and gait normal.  Skin: Skin is warm and dry. No rash noted.  Psychiatric: He has a normal mood and affect. His behavior is normal. Judgment and thought content normal.    Lab Results  Component Value Date   WBC 6.6 09/22/2015   HGB 14.1 09/22/2015   HCT 41.7 09/22/2015   PLT 134 (L) 09/22/2015   GLUCOSE 85 09/22/2015   CHOL 241 (H) 11/12/2013   TRIG 150.0 (H) 11/12/2013   HDL 39.90 11/12/2013   LDLDIRECT 176.7 11/12/2013   ALT 23 11/12/2013   AST 20 11/12/2013   NA 136 09/22/2015   K 4.4 09/22/2015   CL 103 09/22/2015   CREATININE 1.02 09/22/2015   BUN 15 09/22/2015   CO2 27 09/22/2015   TSH 0.57 09/22/2014   PSA 0.59 09/12/2011   INR 1.0 06/14/2009    Mr Lumbar Spine W Wo Contrast  Result Date: 09/22/2015 CLINICAL DATA:  Chronic low back pain; acute exacerbation while lifting heavy boxes at work this evening. Bilateral lower extremity weakness. Pain radiates to lower abdomen. History of thyroid carcinoma. EXAM: MRI LUMBAR SPINE WITHOUT AND WITH CONTRAST TECHNIQUE: Multiplanar and multiecho pulse sequences of the lumbar spine were obtained without and with intravenous contrast. CONTRAST:  39mL MULTIHANCE GADOBENATE DIMEGLUMINE 529 MG/ML IV SOLN COMPARISON:  CT abdomen pelvis November 16, 2013 FINDINGS: Lumbar vertebral bodies and posterior elements are intact and aligned with maintenance of lumbar lordosis. Using the reference  level of the last well-formed intervertebral disc as L5-S1, mild L4-5 disc height loss and decreased T2 signal within the disc consistent with desiccation. The remaining lumbar discs demonstrate normal morphology and signal characteristics. No STIR signal abnormality to suggest acute osseous process. No abnormal osseous or intradiscal enhancement. Conus medullaris terminates at T12-L1 and appears normal in morphology and signal characteristics. Cauda equina is unremarkable. No abnormal cord, leptomeningeal nor epidural enhancement. Included prevertebral and paraspinal soft tissues are normal. Level by level evaluation: L1-2, L2-3, L3-4: No significant disc bulge, canal stenosis or neural foraminal narrowing. L4-5: Small central disc protrusion and broad-based disc bulge, LEFT subarticular annular fissure. Mild to moderate facet arthropathy and ligamentum flavum redundancy. Minimal canal stenosis. Minimal bilateral neural foraminal narrowing. L5-S1: No significant disc bulge. Moderate to severe facet arthropathy. No canal stenosis or neural foraminal narrowing. IMPRESSION: Small  L4-5 central disc protrusion and broad-based disc bulge with LEFT subarticular annular fissure. No acute lumbar spine fracture nor malalignment. Minimal canal stenosis and neural foraminal narrowing at L4-5. Electronically Signed   By: Elon Alas M.D.   On: 09/22/2015 23:12    Assessment & Plan:   There are no diagnoses linked to this encounter. I have discontinued Mr. Wadsworth Lurasidone HCl, divalproex, Diclofenac Sodium, nitroGLYCERIN, predniSONE, and oxyCODONE-acetaminophen. I am also having him maintain his dorzolamide-timolol, latanoprost, clonazePAM, levothyroxine, Fish Oil, Glucosamine-Chondroit-Vit C-Mn (GLUCOSAMINE 1500 COMPLEX PO), omeprazole, and traZODone.  No orders of the defined types were placed in this encounter.    Follow-up: No Follow-up on file.  Walker Kehr, MD

## 2017-03-20 NOTE — Assessment & Plan Note (Signed)
2 sisters and 1 brother - unclear details Colonoscopy is being scheduled - Dr Carlean Purl

## 2017-03-20 NOTE — Assessment & Plan Note (Signed)
[facility] Fluoxetine, Klonopin, Trazodone

## 2017-03-20 NOTE — Assessment & Plan Note (Signed)
On Levothroid 

## 2017-03-20 NOTE — Assessment & Plan Note (Addendum)
BMI 28.5 - mild Discussed diet Cut back on carbs

## 2017-06-16 ENCOUNTER — Encounter: Payer: Self-pay | Admitting: Internal Medicine

## 2017-08-22 ENCOUNTER — Ambulatory Visit (AMBULATORY_SURGERY_CENTER): Payer: Self-pay | Admitting: *Deleted

## 2017-08-22 VITALS — Ht 76.0 in | Wt 221.0 lb

## 2017-08-22 DIAGNOSIS — Z800 Family history of malignant neoplasm of digestive organs: Secondary | ICD-10-CM

## 2017-08-22 NOTE — Progress Notes (Signed)
No egg or soy allergy known to patient  No issues with past sedation with any surgeries  or procedures, no intubation problems  No diet pills per patient No home 02 use per patient  No blood thinners per patient  Pt denies issues with constipation  No A fib or A flutter  EMMI video sent to pt's e mail - pt declined  Pt concerned about frequent recurrent hemorrhoid flares  Changed date at pt's request due to his wife having surgery the same day- 9-21- changed to 09-24-17

## 2017-09-05 ENCOUNTER — Encounter: Payer: BLUE CROSS/BLUE SHIELD | Admitting: Internal Medicine

## 2017-09-17 ENCOUNTER — Encounter: Payer: Self-pay | Admitting: Internal Medicine

## 2017-09-24 ENCOUNTER — Ambulatory Visit (AMBULATORY_SURGERY_CENTER): Payer: BLUE CROSS/BLUE SHIELD | Admitting: Internal Medicine

## 2017-09-24 ENCOUNTER — Encounter: Payer: Self-pay | Admitting: Internal Medicine

## 2017-09-24 VITALS — BP 98/64 | HR 56 | Temp 97.3°F | Resp 9 | Ht 76.0 in | Wt 221.0 lb

## 2017-09-24 DIAGNOSIS — Z1211 Encounter for screening for malignant neoplasm of colon: Secondary | ICD-10-CM

## 2017-09-24 DIAGNOSIS — D122 Benign neoplasm of ascending colon: Secondary | ICD-10-CM

## 2017-09-24 DIAGNOSIS — Z800 Family history of malignant neoplasm of digestive organs: Secondary | ICD-10-CM

## 2017-09-24 MED ORDER — SODIUM CHLORIDE 0.9 % IV SOLN: 500.0000 mL | INTRAVENOUS | Status: DC

## 2017-09-24 NOTE — Op Note (Addendum)
Home Patient Name: Elijah Ashley Procedure Date: 09/24/2017 3:34 PM MRN: [id number] Endoscopist: Gatha Mayer , MD Age: 51 Referring MD:  Date of Birth: October 30, 1966 Gender: Male Account #: [id number] Procedure:                Colonoscopy Indications:              Screening for colorectal malignant neoplasm, This                            is the patient's first colonoscopy Medicines:                Propofol per Anesthesia, Monitored Anesthesia Care Procedure:                Pre-Anesthesia Assessment:                           - Prior to the procedure, a History and Physical                            was performed, and patient medications and                            allergies were reviewed. The patient's tolerance of                            previous anesthesia was also reviewed. The risks                            and benefits of the procedure and the sedation                            options and risks were discussed with the patient.                            All questions were answered, and informed consent                            was obtained. Prior Anticoagulants: The patient has                            taken no previous anticoagulant or antiplatelet                            agents. ASA Grade Assessment: II - A patient with                            mild systemic disease. After reviewing the risks                            and benefits, the patient was deemed in                            satisfactory condition to undergo the procedure.  After obtaining informed consent, the colonoscope                            was passed under direct vision. Throughout the                            procedure, the patient's blood pressure, pulse, and                            oxygen saturations were monitored continuously. The                            Colonoscope was introduced through the anus and   advanced to the the cecum, identified by                            appendiceal orifice and ileocecal valve. The                            colonoscopy was performed without difficulty. The                            ileocecal valve, appendiceal orifice, and rectum                            were photographed. The quality of the bowel                            preparation was excellent. The bowel preparation                            used was Miralax. The patient tolerated the                            procedure well. Scope In: 3:53:24 PM Scope Out: 4:02:05 PM Scope Withdrawal Time: 0 hours 6 minutes 47 seconds  Total Procedure Duration: 0 hours 8 minutes 41 seconds  Findings:                 The perianal and digital rectal examinations were                            normal. Pertinent negatives include normal prostate                            (size, shape, and consistency).                           A diminutive polyp was found in the ascending                            colon. The polyp was sessile. The polyp was removed                            with a cold snare. Resection and retrieval were  complete. Verification of patient identification                            for the specimen was done. Estimated blood loss was                            minimal.                           Internal hemorrhoids were found during retroflexion.                           The exam was otherwise without abnormality on                            direct and retroflexion views. Complications:            No immediate complications. Estimated Blood Loss:     Estimated blood loss was minimal. Impression:               - One diminutive polyp in the ascending colon,                            removed with a cold snare. Resected and retrieved.                           - Internal hemorrhoids.                           - The examination was otherwise normal on direct                             and retroflexion views. Recommendation:           - Patient has a contact number available for                            emergencies. The signs and symptoms of potential                            delayed complications were discussed with the                            patient. Return to normal activities tomorrow.                            Written discharge instructions were provided to the                            patient.                           - Continue present medications.                           - Resume previous diet.                           -  Repeat colonoscopy is recommended. The                            colonoscopy date will be determined after pathology                            results from today's exam become available for                            review.                           - he c/o recurrent hemorrhoid sxs - will discuss                            evaluation for banding                           - Discussion in recovery including wife                           1) brother had colon cancer in 39's and mom in 1's                           2) a sister had pancreatic cancer                           3) a sister had ulcerative colitis                           4) we will set up a hemorrhoid banding appointment Gatha Mayer, MD 09/24/2017 4:08:05 PM This report has been signed electronically.

## 2017-09-24 NOTE — Progress Notes (Signed)
No problems noted in the recovery room. Maw  Per Dr. Carlean Purl, he will have Barbera Setters, RN call pt with an appointment for internal hemorrhoid banding.  maw

## 2017-09-24 NOTE — Progress Notes (Signed)
Called to room to assist during endoscopic procedure.  Patient ID and intended procedure confirmed with present staff. Received instructions for my participation in the procedure from the performing physician.  

## 2017-09-24 NOTE — Progress Notes (Signed)
Pt's states no medical or surgical changes since previsit or office visit. 

## 2017-09-24 NOTE — Progress Notes (Signed)
Report given to PACU, vss 

## 2017-09-24 NOTE — Patient Instructions (Addendum)
I found and removed one tiny polyp that looks benign.  I will let you know pathology results and when to have another routine colonoscopy by mail and/or My Chart.  I will discuss seeing me about the hemorrhoids.  I appreciate the opportunity to care for you.   YOU HAD AN ENDOSCOPIC PROCEDURE TODAY AT [facility]:   Refer to the procedure report that was given to you for any specific questions about what was found during the examination.  If the procedure report does not answer your questions, please call your gastroenterologist to clarify.  If you requested that your care partner not be given the details of your procedure findings, then the procedure report has been included in a sealed envelope for you to review at your convenience later.  YOU SHOULD EXPECT: Some feelings of bloating in the abdomen. Passage of more gas than usual.  Walking can help get rid of the air that was put into your GI tract during the procedure and reduce the bloating. If you had a lower endoscopy (such as a colonoscopy or flexible sigmoidoscopy) you may notice spotting of blood in your stool or on the toilet paper. If you underwent a bowel prep for your procedure, you may not have a normal bowel movement for a few days.  Please Note:  You might notice some irritation and congestion in your nose or some drainage.  This is from the oxygen used during your procedure.  There is no need for concern and it should clear up in a day or so.  SYMPTOMS TO REPORT IMMEDIATELY:   Following lower endoscopy (colonoscopy or flexible sigmoidoscopy):  Excessive amounts of blood in the stool  Significant tenderness or worsening of abdominal pains  Swelling of the abdomen that is new, acute  Fever of 100F or higher   For urgent or emergent issues, a gastroenterologist can be reached at any hour by calling [phone].   DIET:  We do recommend a small meal at first, but then you may proceed to your  regular diet.  Drink plenty of fluids but you should avoid alcoholic beverages for 24 hours.  ACTIVITY:  You should plan to take it easy for the rest of today and you should NOT DRIVE or use heavy machinery until tomorrow (because of the sedation medicines used during the test).    FOLLOW UP: Our staff will call the number listed on your records the next business day following your procedure to check on you and address any questions or concerns that you may have regarding the information given to you following your procedure. If we do not reach you, we will leave a message.  However, if you are feeling well and you are not experiencing any problems, there is no need to return our call.  We will assume that you have returned to your regular daily activities without incident.  If any biopsies were taken you will be contacted by phone or by letter within the next 1-3 weeks.  Please call us at [phone] if you have not heard about the biopsies in 3 weeks.    SIGNATURES/CONFIDENTIALITY: You and/or your care partner have signed paperwork which will be entered into your electronic medical record.  These signatures attest to the fact that that the information above on your After Visit Summary has been reviewed and is understood.  Full responsibility of the confidentiality of this discharge information lies with you and/or your care-partner.   Handouts were given  to your care partner on polyps, hemorrhoids, and hemorrhoid banding info. Barbera Setters, RN , Dr. Celesta Aver nurse will call you with an appointment for internal hemorrhoid banding. You may resume your current medications today. Await biopsy results. Please call if any questions or concerns.

## 2017-09-25 ENCOUNTER — Telehealth: Payer: Self-pay | Admitting: *Deleted

## 2017-09-25 NOTE — Telephone Encounter (Signed)
  Follow up Call-  Call back number 09/24/2017  Post procedure Call Back phone  # [phone]  Permission to leave phone message Yes  Some recent data might be hidden     Patient questions:  Do you have a fever, pain , or abdominal swelling? No. Pain Score  0 *  Have you tolerated food without any problems? Yes.    Have you been able to return to your normal activities? Yes.    Do you have any questions about your discharge instructions: Diet   No. Medications  No. Follow up visit  No.  Do you have questions or concerns about your Care? No.  Actions: * If pain score is 4 or above: No action needed, pain <4.

## 2017-10-02 ENCOUNTER — Encounter: Payer: Self-pay | Admitting: Internal Medicine

## 2017-10-02 DIAGNOSIS — Z86010 Personal history of colonic polyps: Secondary | ICD-10-CM

## 2017-10-02 DIAGNOSIS — Z860101 Personal history of adenomatous and serrated colon polyps: Secondary | ICD-10-CM

## 2017-10-02 HISTORY — DX: Personal history of adenomatous and serrated colon polyps: Z86.0101

## 2017-10-02 HISTORY — DX: Personal history of colonic polyps: Z86.010

## 2017-10-02 NOTE — Progress Notes (Signed)
Diminutive adenoma Repeat colonoscopy 2023

## 2017-10-14 ENCOUNTER — Other Ambulatory Visit (INDEPENDENT_AMBULATORY_CARE_PROVIDER_SITE_OTHER): Payer: BLUE CROSS/BLUE SHIELD

## 2017-10-14 ENCOUNTER — Ambulatory Visit (INDEPENDENT_AMBULATORY_CARE_PROVIDER_SITE_OTHER): Payer: BLUE CROSS/BLUE SHIELD | Admitting: Internal Medicine

## 2017-10-14 ENCOUNTER — Encounter: Payer: Self-pay | Admitting: Internal Medicine

## 2017-10-14 ENCOUNTER — Other Ambulatory Visit: Payer: BLUE CROSS/BLUE SHIELD

## 2017-10-14 VITALS — BP 108/76 | HR 70 | Temp 98.4°F | Ht 76.0 in | Wt 221.0 lb

## 2017-10-14 DIAGNOSIS — Z23 Encounter for immunization: Secondary | ICD-10-CM

## 2017-10-14 DIAGNOSIS — N390 Urinary tract infection, site not specified: Secondary | ICD-10-CM | POA: Insufficient documentation

## 2017-10-14 DIAGNOSIS — E890 Postprocedural hypothyroidism: Secondary | ICD-10-CM

## 2017-10-14 DIAGNOSIS — R509 Fever, unspecified: Secondary | ICD-10-CM | POA: Insufficient documentation

## 2017-10-14 DIAGNOSIS — R202 Paresthesia of skin: Secondary | ICD-10-CM

## 2017-10-14 LAB — URINALYSIS, ROUTINE W REFLEX MICROSCOPIC
BILIRUBIN URINE: NEGATIVE
HGB URINE DIPSTICK: NEGATIVE
Ketones, ur: NEGATIVE
Nitrite: NEGATIVE
PH: 6.5 (ref 5.0–8.0)
Specific Gravity, Urine: 1.02 (ref 1.000–1.030)
TOTAL PROTEIN, URINE-UPE24: NEGATIVE
URINE GLUCOSE: NEGATIVE
UROBILINOGEN UA: 1 (ref 0.0–1.0)

## 2017-10-14 LAB — BASIC METABOLIC PANEL
BUN: 12 mg/dL (ref 6–23)
CALCIUM: 9.1 mg/dL (ref 8.4–10.5)
CO2: 29 mEq/L (ref 19–32)
CREATININE: 0.92 mg/dL (ref 0.40–1.50)
Chloride: 103 mEq/L (ref 96–112)
GFR: 111.94 mL/min (ref >60.00)
Glucose, Bld: 83 mg/dL (ref 70–99)
POTASSIUM: 4.4 meq/L (ref 3.5–5.1)
Sodium: 138 mEq/L (ref 135–145)

## 2017-10-14 LAB — CBC WITH DIFFERENTIAL/PLATELET
BASOS ABS: 0 10*3/uL (ref 0.0–0.1)
Basophils Relative: 0.5 % (ref 0.0–3.0)
EOS ABS: 0.2 10*3/uL (ref 0.0–0.7)
EOS PCT: 2.4 % (ref 0.0–5.0)
HCT: 41.2 % (ref 39.0–52.0)
HEMOGLOBIN: 13.4 g/dL (ref 13.0–17.0)
Lymphocytes Relative: 33.6 % (ref 12.0–46.0)
Lymphs Abs: 3 10*3/uL (ref 0.7–4.0)
MCHC: 32.5 g/dL (ref 30.0–36.0)
MCV: 90.6 fl (ref 78.0–100.0)
MONO ABS: 1.1 10*3/uL — AB (ref 0.1–1.0)
Monocytes Relative: 12.3 % — ABNORMAL HIGH (ref 3.0–12.0)
Neutro Abs: 4.5 10*3/uL (ref 1.4–7.7)
Neutrophils Relative %: 51.2 % (ref 43.0–77.0)
Platelets: 197 10*3/uL (ref 150.0–400.0)
RBC: 4.54 Mil/uL (ref 4.22–5.81)
RDW: 13.7 % (ref 11.5–15.5)
WBC: 8.8 10*3/uL (ref 4.0–10.5)

## 2017-10-14 LAB — VITAMIN B12: Vitamin B-12: >1500 pg/mL — ABNORMAL HIGH (ref 211–911)

## 2017-10-14 LAB — SEDIMENTATION RATE: Sed Rate: 43 mm/hr — ABNORMAL HIGH (ref 0–20)

## 2017-10-14 LAB — HEPATIC FUNCTION PANEL
ALK PHOS: 224 U/L — AB (ref 39–117)
ALT: 154 U/L — AB (ref 0–53)
AST: 64 U/L — AB (ref 0–37)
Albumin: 4.1 g/dL (ref 3.5–5.2)
BILIRUBIN DIRECT: 0.1 mg/dL (ref 0.0–0.3)
BILIRUBIN TOTAL: 0.5 mg/dL (ref 0.2–1.2)
Total Protein: 7.1 g/dL (ref 6.0–8.3)

## 2017-10-14 LAB — PSA: PSA: 8.98 ng/mL — ABNORMAL HIGH (ref 0.10–4.00)

## 2017-10-14 MED ORDER — CIPROFLOXACIN HCL 500 MG PO TABS: 500.0000 mg | ORAL_TABLET | Freq: Two times a day (BID) | ORAL | 0 refills | Status: AC

## 2017-10-14 NOTE — Assessment & Plan Note (Signed)
TSH was tested recently

## 2017-10-14 NOTE — Assessment & Plan Note (Signed)
Prostatitis vs other Cipro Labs

## 2017-10-14 NOTE — Assessment & Plan Note (Signed)
Cipro Abd [place]

## 2017-10-14 NOTE — Assessment & Plan Note (Signed)
Labs

## 2017-10-14 NOTE — Progress Notes (Signed)
Subjective:  Patient ID: NTHONY Ashley, male    DOB: Nov 03, 1966  Age: 51 y.o. MRN: [id number]  CC: No chief complaint on file.   HPI Miran Kautzman Mcanelly presents for frequent urination C/o fever and chills a week ago, shivering - T=103F. S/p recent colonoscopy  Outpatient Medications Prior to Visit  Medication Sig Dispense Refill  . buPROPion (ZYBAN) 150 MG 12 hr tablet Take 150 mg by mouth 2 (two) times daily.    . Cholecalciferol (VITAMIN D3) 2000 units capsule Take 1 capsule (2,000 Units total) by mouth daily. 100 capsule 3  . clonazePAM (KLONOPIN) 0.5 MG tablet Take 1/2-1 tablet by mouth as needed    . dorzolamide-timolol (COSOPT) 22.3-6.8 MG/ML ophthalmic solution Place 1 drop into both eyes 2 (two) times daily.      Marland Kitchen FLUoxetine (PROZAC) 40 MG capsule Take 1 capsule (40 mg total) by mouth daily. 90 capsule 3  . Glucosamine-Chondroit-Vit C-Mn (GLUCOSAMINE 1500 COMPLEX PO) Take by mouth as needed.    . latanoprost (XALATAN) 0.005 % ophthalmic solution Place 1 drop into both eyes at bedtime. 2.5 mL 1  . levothyroxine (SYNTHROID, LEVOTHROID) 200 MCG tablet Take 175 mcg by mouth daily.     . Omega-3 Fatty Acids (FISH OIL) 1000 MG CAPS Take by mouth daily.    Marland Kitchen OVER THE COUNTER MEDICATION Take 1 capsule by mouth daily. Ginger and tumeric daily    . traZODone (DESYREL) 50 MG tablet Take 0.5-1 tablets (25-50 mg total) by mouth at bedtime as needed for sleep. 30 tablet 2  . bisacodyl (DULCOLAX) 5 MG EC tablet Take 5 mg by mouth once. For colon 9-21 x 4    . polyethylene glycol powder (MIRALAX) powder Take 1 Container by mouth once. 238 grams for colon 9-21     Facility-Administered Medications Prior to Visit  Medication Dose Route Frequency Provider Last Rate Last Dose  . 0.9 %  sodium chloride infusion  500 mL Intravenous Continuous Gatha Mayer, MD        ROS Review of Systems  Constitutional: Positive for fatigue and fever. Negative for appetite change and unexpected weight  change.  HENT: Negative for congestion, nosebleeds, sneezing, sore throat and trouble swallowing.   Eyes: Negative for itching and visual disturbance.  Respiratory: Negative for cough.   Cardiovascular: Negative for chest pain, palpitations and leg swelling.  Gastrointestinal: Negative for abdominal distention, blood in stool, diarrhea and nausea.  Genitourinary: Positive for dysuria and frequency. Negative for hematuria.  Musculoskeletal: Positive for arthralgias. Negative for back pain, gait problem, joint swelling and neck pain.  Skin: Negative for rash.  Neurological: Negative for dizziness, tremors, speech difficulty and weakness.  Psychiatric/Behavioral: Negative for agitation, dysphoric mood and sleep disturbance. The patient is not nervous/anxious.     Objective:  BP 108/76 (BP Location: Right Arm, Patient Position: Sitting, Cuff Size: Large)   Pulse 70   Temp 98.4 F (36.9 C) (Oral)   Ht 6\' 4"  (1.93 m)   Wt 221 lb (100.2 kg)   SpO2 98%   BMI 26.90 kg/m   BP Readings from Last 3 Encounters:  10/14/17 108/76  09/24/17 98/64  03/20/17 118/72    Wt Readings from Last 3 Encounters:  10/14/17 221 lb (100.2 kg)  09/24/17 221 lb (100.2 kg)  08/22/17 221 lb (100.2 kg)    Physical Exam  Constitutional: He is oriented to person, place, and time. He appears well-developed. No distress.  NAD  HENT:  Mouth/Throat: Oropharynx is clear  and moist.  Eyes: Pupils are equal, round, and reactive to light. Conjunctivae are normal.  Neck: Normal range of motion. No JVD present. No thyromegaly present.  Cardiovascular: Normal rate, regular rhythm, normal heart sounds and intact distal pulses.  Exam reveals no gallop and no friction rub.   No murmur heard. Pulmonary/Chest: Effort normal and breath sounds normal. No respiratory distress. He has no wheezes. He has no rales. He exhibits no tenderness.  Abdominal: Soft. Bowel sounds are normal. He exhibits no distension and no mass. There  is no tenderness. There is no rebound and no guarding.  Musculoskeletal: Normal range of motion. He exhibits no edema or tenderness.  Lymphadenopathy:    He has no cervical adenopathy.  Neurological: He is alert and oriented to person, place, and time. He has normal reflexes. No cranial nerve deficit. He exhibits normal muscle tone. He displays a negative Romberg sign. Coordination and gait normal.  Skin: Skin is warm and dry. No rash noted.  Psychiatric: He has a normal mood and affect. His behavior is normal. Judgment and thought content normal.  rectal exam was done recently w/GI  Lab Results  Component Value Date   WBC 6.6 09/22/2015   HGB 14.1 09/22/2015   HCT 41.7 09/22/2015   PLT 134 (L) 09/22/2015   GLUCOSE 85 09/22/2015   CHOL 241 (H) 11/12/2013   TRIG 150.0 (H) 11/12/2013   HDL 39.90 11/12/2013   LDLDIRECT 176.7 11/12/2013   ALT 23 11/12/2013   AST 20 11/12/2013   NA 136 09/22/2015   K 4.4 09/22/2015   CL 103 09/22/2015   CREATININE 1.02 09/22/2015   BUN 15 09/22/2015   CO2 27 09/22/2015   TSH 0.57 09/22/2014   PSA 0.59 09/12/2011   INR 1.0 06/14/2009    Mr Lumbar Spine W Wo Contrast  Result Date: 09/22/2015 CLINICAL DATA:  Chronic low back pain; acute exacerbation while lifting heavy boxes at work this evening. Bilateral lower extremity weakness. Pain radiates to lower abdomen. History of thyroid carcinoma. EXAM: MRI LUMBAR SPINE WITHOUT AND WITH CONTRAST TECHNIQUE: Multiplanar and multiecho pulse sequences of the lumbar spine were obtained without and with intravenous contrast. CONTRAST:  35mL MULTIHANCE GADOBENATE DIMEGLUMINE 529 MG/ML IV SOLN COMPARISON:  CT abdomen pelvis November 16, 2013 FINDINGS: Lumbar vertebral bodies and posterior elements are intact and aligned with maintenance of lumbar lordosis. Using the reference level of the last well-formed intervertebral disc as L5-S1, mild L4-5 disc height loss and decreased T2 signal within the disc consistent with  desiccation. The remaining lumbar discs demonstrate normal morphology and signal characteristics. No STIR signal abnormality to suggest acute osseous process. No abnormal osseous or intradiscal enhancement. Conus medullaris terminates at T12-L1 and appears normal in morphology and signal characteristics. Cauda equina is unremarkable. No abnormal cord, leptomeningeal nor epidural enhancement. Included prevertebral and paraspinal soft tissues are normal. Level by level evaluation: L1-2, L2-3, L3-4: No significant disc bulge, canal stenosis or neural foraminal narrowing. L4-5: Small central disc protrusion and broad-based disc bulge, LEFT subarticular annular fissure. Mild to moderate facet arthropathy and ligamentum flavum redundancy. Minimal canal stenosis. Minimal bilateral neural foraminal narrowing. L5-S1: No significant disc bulge. Moderate to severe facet arthropathy. No canal stenosis or neural foraminal narrowing. IMPRESSION: Small L4-5 central disc protrusion and broad-based disc bulge with LEFT subarticular annular fissure. No acute lumbar spine fracture nor malalignment. Minimal canal stenosis and neural foraminal narrowing at L4-5. Electronically Signed   By: Elon Alas M.D.   On: 09/22/2015 23:12  Assessment & Plan:   There are no diagnoses linked to this encounter. I have discontinued Mr. Beaumier bisacodyl and polyethylene glycol powder. I am also having him maintain his dorzolamide-timolol, latanoprost, clonazePAM, levothyroxine, Fish Oil, Glucosamine-Chondroit-Vit C-Mn (GLUCOSAMINE 1500 COMPLEX PO), traZODone, FLUoxetine, Vitamin D3, buPROPion, and OVER THE COUNTER MEDICATION. We will continue to administer sodium chloride.  No orders of the defined types were placed in this encounter.    Follow-up: No Follow-up on file.  Walker Kehr, MD

## 2017-10-16 ENCOUNTER — Telehealth: Payer: Self-pay | Admitting: Internal Medicine

## 2017-10-16 DIAGNOSIS — R7989 Other specified abnormal findings of blood chemistry: Secondary | ICD-10-CM

## 2017-10-16 DIAGNOSIS — R945 Abnormal results of liver function studies: Secondary | ICD-10-CM

## 2017-10-16 DIAGNOSIS — R972 Elevated prostate specific antigen [PSA]: Secondary | ICD-10-CM

## 2017-10-16 NOTE — Telephone Encounter (Signed)
Patient requesting call back in regard to lab results.  

## 2017-10-17 NOTE — Telephone Encounter (Signed)
See lab results.  

## 2017-11-17 ENCOUNTER — Other Ambulatory Visit (INDEPENDENT_AMBULATORY_CARE_PROVIDER_SITE_OTHER): Payer: BLUE CROSS/BLUE SHIELD

## 2017-11-17 DIAGNOSIS — R7989 Other specified abnormal findings of blood chemistry: Secondary | ICD-10-CM

## 2017-11-17 DIAGNOSIS — R945 Abnormal results of liver function studies: Secondary | ICD-10-CM

## 2017-11-17 DIAGNOSIS — R972 Elevated prostate specific antigen [PSA]: Secondary | ICD-10-CM

## 2017-11-17 LAB — URINALYSIS
Bilirubin Urine: NEGATIVE
HGB URINE DIPSTICK: NEGATIVE
Ketones, ur: NEGATIVE
LEUKOCYTES UA: NEGATIVE
NITRITE: NEGATIVE
Specific Gravity, Urine: 1.015 (ref 1.000–1.030)
TOTAL PROTEIN, URINE-UPE24: NEGATIVE
Urine Glucose: NEGATIVE
Urobilinogen, UA: 0.2 (ref 0.0–1.0)
pH: 7.5 (ref 5.0–8.0)

## 2017-11-17 LAB — HEPATIC FUNCTION PANEL
ALT: 19 U/L (ref 0–53)
AST: 21 U/L (ref 0–37)
Albumin: 4.4 g/dL (ref 3.5–5.2)
Alkaline Phosphatase: 95 U/L (ref 39–117)
BILIRUBIN DIRECT: 0.1 mg/dL (ref 0.0–0.3)
BILIRUBIN TOTAL: 0.6 mg/dL (ref 0.2–1.2)
Total Protein: 7 g/dL (ref 6.0–8.3)

## 2017-11-17 LAB — PSA: PSA: 1.61 ng/mL (ref 0.10–4.00)

## 2017-11-20 ENCOUNTER — Ambulatory Visit: Payer: BLUE CROSS/BLUE SHIELD | Admitting: Internal Medicine

## 2017-11-20 ENCOUNTER — Encounter: Payer: Self-pay | Admitting: Internal Medicine

## 2017-11-20 DIAGNOSIS — N390 Urinary tract infection, site not specified: Secondary | ICD-10-CM

## 2017-11-20 DIAGNOSIS — R945 Abnormal results of liver function studies: Secondary | ICD-10-CM

## 2017-11-20 DIAGNOSIS — R7989 Other specified abnormal findings of blood chemistry: Secondary | ICD-10-CM

## 2017-11-20 NOTE — Assessment & Plan Note (Signed)
Last LFTs nl Abd [place]

## 2017-11-20 NOTE — Progress Notes (Signed)
Subjective:  Patient ID: Elijah Ashley, male    DOB: [birth year]  Age: 51 y.o. MRN: [id number]  CC: No chief complaint on file.   HPI Elijah Ashley [place] presents for elev LFTs, UTI, hypothyroidism f/u Working a lot - [employer] (5pm-5am)  Outpatient Medications Prior to Visit  Medication Sig Dispense Refill  . Cholecalciferol (VITAMIN D3) 2000 units capsule Take 1 capsule (2,000 Units total) by mouth daily. 100 capsule 3  . clonazePAM (KLONOPIN) 0.5 MG tablet Take 1/2-1 tablet by mouth as needed    . dorzolamide-timolol (COSOPT) 22.3-6.8 MG/ML ophthalmic solution Place 1 drop into both eyes 2 (two) times daily.      Marland Kitchen FLUoxetine (PROZAC) 40 MG capsule Take 1 capsule (40 mg total) by mouth daily. 90 capsule 3  . Glucosamine-Chondroit-Vit C-Mn (GLUCOSAMINE 1500 COMPLEX PO) Take by mouth as needed.    . latanoprost (XALATAN) 0.005 % ophthalmic solution Place 1 drop into both eyes at bedtime. 2.5 mL 1  . levothyroxine (SYNTHROID, LEVOTHROID) 200 MCG tablet Take 175 mcg by mouth daily.     . Omega-3 Fatty Acids (FISH OIL) 1000 MG CAPS Take by mouth daily.    Marland Kitchen OVER THE COUNTER MEDICATION Take 1 capsule by mouth daily. Ginger and tumeric daily    . traZODone (DESYREL) 50 MG tablet Take 0.5-1 tablets (25-50 mg total) by mouth at bedtime as needed for sleep. 30 tablet 2  . buPROPion (ZYBAN) 150 MG 12 hr tablet Take 150 mg by mouth 2 (two) times daily.     Facility-Administered Medications Prior to Visit  Medication Dose Route Frequency Provider Last Rate Last Dose  . 0.9 %  sodium chloride infusion  500 mL Intravenous Continuous Gatha Mayer, MD        ROS Review of Systems  Constitutional: Negative for appetite change, fatigue and unexpected weight change.  HENT: Negative for congestion, nosebleeds, sneezing, sore throat and trouble swallowing.   Eyes: Negative for itching and visual disturbance.  Respiratory: Negative for cough.   Cardiovascular: Negative for chest pain, palpitations  and leg swelling.  Gastrointestinal: Negative for abdominal distention, blood in stool, diarrhea and nausea.  Genitourinary: Negative for frequency and hematuria.  Musculoskeletal: Negative for back pain, gait problem, joint swelling and neck pain.  Skin: Negative for rash.  Neurological: Negative for dizziness, tremors, speech difficulty and weakness.  Psychiatric/Behavioral: Negative for agitation, dysphoric mood and sleep disturbance. The patient is not nervous/anxious.     Objective:  BP 114/72 (BP Location: Left Arm, Patient Position: Sitting, Cuff Size: Large)   Pulse 70   Temp 98.4 F (36.9 C) (Oral)   Ht 6\' 4"  (1.93 m)   Wt 221 lb (100.2 kg)   SpO2 99%   BMI 26.90 kg/m   BP Readings from Last 3 Encounters:  11/20/17 114/72  10/14/17 108/76  09/24/17 98/64    Wt Readings from Last 3 Encounters:  11/20/17 221 lb (100.2 kg)  10/14/17 221 lb (100.2 kg)  09/24/17 221 lb (100.2 kg)    Physical Exam  Constitutional: He is oriented to person, place, and time. He appears well-developed. No distress.  NAD  HENT:  Mouth/Throat: Oropharynx is clear and moist.  Eyes: Conjunctivae are normal. Pupils are equal, round, and reactive to light.  Neck: Normal range of motion. No JVD present. No thyromegaly present.  Cardiovascular: Normal rate, regular rhythm, normal heart sounds and intact distal pulses. Exam reveals no gallop and no friction rub.  No murmur heard. Pulmonary/Chest: Effort normal  and breath sounds normal. No respiratory distress. He has no wheezes. He has no rales. He exhibits no tenderness.  Abdominal: Soft. Bowel sounds are normal. He exhibits no distension and no mass. There is no tenderness. There is no rebound and no guarding.  Musculoskeletal: Normal range of motion. He exhibits no edema or tenderness.  Lymphadenopathy:    He has no cervical adenopathy.  Neurological: He is alert and oriented to person, place, and time. He has normal reflexes. No cranial nerve  deficit. He exhibits normal muscle tone. He displays a negative Romberg sign. Coordination and gait normal.  Skin: Skin is warm and dry. No rash noted.  Psychiatric: He has a normal mood and affect. His behavior is normal. Judgment and thought content normal.    Lab Results  Component Value Date   WBC 8.8 10/14/2017   HGB 13.4 10/14/2017   HCT 41.2 10/14/2017   PLT 197.0 10/14/2017   GLUCOSE 83 10/14/2017   CHOL 241 (H) 11/12/2013   TRIG 150.0 (H) 11/12/2013   HDL 39.90 11/12/2013   LDLDIRECT 176.7 11/12/2013   ALT 19 11/17/2017   AST 21 11/17/2017   NA 138 10/14/2017   K 4.4 10/14/2017   CL 103 10/14/2017   CREATININE 0.92 10/14/2017   BUN 12 10/14/2017   CO2 29 10/14/2017   TSH 0.57 09/22/2014   PSA 1.61 11/17/2017   INR 1.0 06/14/2009    Mr Lumbar Spine W Wo Contrast  Result Date: 09/22/2015 CLINICAL DATA:  Chronic low back pain; acute exacerbation while lifting heavy boxes at work this evening. Bilateral lower extremity weakness. Pain radiates to lower abdomen. History of thyroid carcinoma. EXAM: MRI LUMBAR SPINE WITHOUT AND WITH CONTRAST TECHNIQUE: Multiplanar and multiecho pulse sequences of the lumbar spine were obtained without and with intravenous contrast. CONTRAST:  4mL MULTIHANCE GADOBENATE DIMEGLUMINE 529 MG/ML IV SOLN COMPARISON:  CT abdomen pelvis November 16, 2013 FINDINGS: Lumbar vertebral bodies and posterior elements are intact and aligned with maintenance of lumbar lordosis. Using the reference level of the last well-formed intervertebral disc as L5-S1, mild L4-5 disc height loss and decreased T2 signal within the disc consistent with desiccation. The remaining lumbar discs demonstrate normal morphology and signal characteristics. No STIR signal abnormality to suggest acute osseous process. No abnormal osseous or intradiscal enhancement. Conus medullaris terminates at T12-L1 and appears normal in morphology and signal characteristics. Cauda equina is unremarkable.  No abnormal cord, leptomeningeal nor epidural enhancement. Included prevertebral and paraspinal soft tissues are normal. Level by level evaluation: L1-2, L2-3, L3-4: No significant disc bulge, canal stenosis or neural foraminal narrowing. L4-5: Small central disc protrusion and broad-based disc bulge, LEFT subarticular annular fissure. Mild to moderate facet arthropathy and ligamentum flavum redundancy. Minimal canal stenosis. Minimal bilateral neural foraminal narrowing. L5-S1: No significant disc bulge. Moderate to severe facet arthropathy. No canal stenosis or neural foraminal narrowing. IMPRESSION: Small L4-5 central disc protrusion and broad-based disc bulge with LEFT subarticular annular fissure. No acute lumbar spine fracture nor malalignment. Minimal canal stenosis and neural foraminal narrowing at L4-5. Electronically Signed   By: Elon Alas M.D.   On: 09/22/2015 23:12    Assessment & Plan:   There are no diagnoses linked to this encounter. I have discontinued Oda Kilts. Boulay's buPROPion. I am also having him maintain his dorzolamide-timolol, latanoprost, clonazePAM, levothyroxine, Fish Oil, Glucosamine-Chondroit-Vit C-Mn (GLUCOSAMINE 1500 COMPLEX PO), traZODone, FLUoxetine, Vitamin D3, and OVER THE COUNTER MEDICATION. We will continue to administer sodium chloride.  No orders of the  defined types were placed in this encounter.    Follow-up: No Follow-up on file.  Walker Kehr, MD

## 2017-11-20 NOTE — Patient Instructions (Signed)
Nuvigil for shift workers

## 2017-11-20 NOTE — Assessment & Plan Note (Signed)
UA nl Abd [place]

## 2017-12-12 ENCOUNTER — Encounter: Payer: Self-pay | Admitting: Internal Medicine

## 2018-01-02 ENCOUNTER — Encounter: Payer: Self-pay | Admitting: Internal Medicine

## 2018-01-02 ENCOUNTER — Ambulatory Visit: Payer: BLUE CROSS/BLUE SHIELD | Admitting: Internal Medicine

## 2018-01-02 DIAGNOSIS — L731 Pseudofolliculitis barbae: Secondary | ICD-10-CM | POA: Insufficient documentation

## 2018-01-02 NOTE — Progress Notes (Signed)
Subjective:  Patient ID: Elijah Ashley, male    DOB: 09-08-1966  Age: 52 y.o. MRN: [id number]  CC: No chief complaint on file.   HPI Javis Abboud Lanes presents for ingrown hair problem on face - long time...  Outpatient Medications Prior to Visit  Medication Sig Dispense Refill  . Cholecalciferol (VITAMIN D3) 2000 units capsule Take 1 capsule (2,000 Units total) by mouth daily. 100 capsule 3  . clonazePAM (KLONOPIN) 0.5 MG tablet Take 1/2-1 tablet by mouth as needed    . dorzolamide-timolol (COSOPT) 22.3-6.8 MG/ML ophthalmic solution Place 1 drop into both eyes 2 (two) times daily.      Marland Kitchen FLUoxetine (PROZAC) 40 MG capsule Take 1 capsule (40 mg total) by mouth daily. 90 capsule 3  . Glucosamine-Chondroit-Vit C-Mn (GLUCOSAMINE 1500 COMPLEX PO) Take by mouth as needed.    . latanoprost (XALATAN) 0.005 % ophthalmic solution Place 1 drop into both eyes at bedtime. 2.5 mL 1  . levothyroxine (SYNTHROID, LEVOTHROID) 200 MCG tablet Take 175 mcg by mouth daily.     . Omega-3 Fatty Acids (FISH OIL) 1000 MG CAPS Take by mouth daily.    Marland Kitchen OVER THE COUNTER MEDICATION Take 1 capsule by mouth daily. Ginger and tumeric daily    . traZODone (DESYREL) 50 MG tablet Take 0.5-1 tablets (25-50 mg total) by mouth at bedtime as needed for sleep. 30 tablet 2   Facility-Administered Medications Prior to Visit  Medication Dose Route Frequency Provider Last Rate Last Dose  . 0.9 %  sodium chloride infusion  500 mL Intravenous Continuous Gatha Mayer, MD        ROS Review of Systems  Constitutional: Negative for appetite change, fatigue and unexpected weight change.  HENT: Negative for congestion, nosebleeds, sneezing, sore throat and trouble swallowing.   Eyes: Negative for itching and visual disturbance.  Respiratory: Negative for cough.   Cardiovascular: Negative for chest pain, palpitations and leg swelling.  Gastrointestinal: Negative for abdominal distention, blood in stool, diarrhea and nausea.   Genitourinary: Negative for frequency and hematuria.  Musculoskeletal: Negative for back pain, gait problem, joint swelling and neck pain.  Skin: Positive for rash.  Neurological: Negative for dizziness, tremors, speech difficulty and weakness.  Psychiatric/Behavioral: Negative for agitation, dysphoric mood and sleep disturbance. The patient is not nervous/anxious.     Objective:  BP 118/72 (BP Location: Left Arm, Patient Position: Sitting, Cuff Size: Large)   Pulse 61   Temp 97.8 F (36.6 C) (Oral)   Ht 6\' 4"  (1.93 m)   Wt 226 lb (102.5 kg)   SpO2 98%   BMI 27.51 kg/m   BP Readings from Last 3 Encounters:  01/02/18 118/72  11/20/17 114/72  10/14/17 108/76    Wt Readings from Last 3 Encounters:  01/02/18 226 lb (102.5 kg)  11/20/17 221 lb (100.2 kg)  10/14/17 221 lb (100.2 kg)    Physical Exam  Constitutional: He appears well-nourished. No distress.  Skin: Rash noted.  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  Lab Results  Component Value Date   WBC 8.8 10/14/2017   HGB 13.4 10/14/2017   HCT 41.2 10/14/2017   PLT 197.0 10/14/2017   GLUCOSE 83 10/14/2017   CHOL 241 (H) 11/12/2013   TRIG 150.0 (H) 11/12/2013   HDL 39.90 11/12/2013   LDLDIRECT 176.7 11/12/2013   ALT 19 11/17/2017   AST 21 11/17/2017   NA 138 10/14/2017   K 4.4 10/14/2017   CL 103 10/14/2017  CREATININE 0.92 10/14/2017   BUN 12 10/14/2017   CO2 29 10/14/2017   TSH 0.57 09/22/2014   PSA 1.61 11/17/2017   INR 1.0 06/14/2009    Mr Lumbar Spine W Wo Contrast  Result Date: 09/22/2015 CLINICAL DATA:  Chronic low back pain; acute exacerbation while lifting heavy boxes at work this evening. Bilateral lower extremity weakness. Pain radiates to lower abdomen. History of thyroid carcinoma. EXAM: MRI LUMBAR SPINE WITHOUT AND WITH CONTRAST TECHNIQUE: Multiplanar and multiecho pulse sequences of the lumbar spine were obtained without and with intravenous contrast. CONTRAST:  40mL  MULTIHANCE GADOBENATE DIMEGLUMINE 529 MG/ML IV SOLN COMPARISON:  CT abdomen pelvis November 16, 2013 FINDINGS: Lumbar vertebral bodies and posterior elements are intact and aligned with maintenance of lumbar lordosis. Using the reference level of the last well-formed intervertebral disc as L5-S1, mild L4-5 disc height loss and decreased T2 signal within the disc consistent with desiccation. The remaining lumbar discs demonstrate normal morphology and signal characteristics. No STIR signal abnormality to suggest acute osseous process. No abnormal osseous or intradiscal enhancement. Conus medullaris terminates at T12-L1 and appears normal in morphology and signal characteristics. Cauda equina is unremarkable. No abnormal cord, leptomeningeal nor epidural enhancement. Included prevertebral and paraspinal soft tissues are normal. Level by level evaluation: L1-2, L2-3, L3-4: No significant disc bulge, canal stenosis or neural foraminal narrowing. L4-5: Small central disc protrusion and broad-based disc bulge, LEFT subarticular annular fissure. Mild to moderate facet arthropathy and ligamentum flavum redundancy. Minimal canal stenosis. Minimal bilateral neural foraminal narrowing. L5-S1: No significant disc bulge. Moderate to severe facet arthropathy. No canal stenosis or neural foraminal narrowing. IMPRESSION: Small L4-5 central disc protrusion and broad-based disc bulge with LEFT subarticular annular fissure. No acute lumbar spine fracture nor malalignment. Minimal canal stenosis and neural foraminal narrowing at L4-5. Electronically Signed   By: Elon Alas M.D.   On: 09/22/2015 23:12    Assessment & Plan:   Diagnoses and all orders for this visit:  Ingrowing hair   I am having Oda Kilts. Novoa maintain his dorzolamide-timolol, latanoprost, clonazePAM, levothyroxine, Fish Oil, Glucosamine-Chondroit-Vit C-Mn (GLUCOSAMINE 1500 COMPLEX PO), traZODone, FLUoxetine, Vitamin D3, and OVER THE COUNTER  MEDICATION. We will continue to administer sodium chloride.  No orders of the defined types were placed in this encounter.    Follow-up: No Follow-up on file.  Walker Kehr, MD

## 2018-01-02 NOTE — Assessment & Plan Note (Signed)
Shaving makes it worse Needs to have a beard/facial hair Form for [employer] filled out

## 2018-01-27 ENCOUNTER — Telehealth: Payer: Self-pay | Admitting: *Deleted

## 2018-01-27 MED ORDER — ARMODAFINIL 150 MG PO TABS: 150.0000 mg | ORAL_TABLET | Freq: Every day | ORAL | 5 refills | Status: DC

## 2018-01-27 NOTE — Telephone Encounter (Signed)
Rx emailed to [facility] Thx

## 2018-01-27 NOTE — Telephone Encounter (Signed)
Notifed pt MD rx Nuvigil and rx was sent to [facility]. Wife states they use [facility] due to insurance. [facility]. Updated pharmacy...Elijah Ashley

## 2018-01-27 NOTE — Telephone Encounter (Signed)
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

## 2018-01-28 MED ORDER — ARMODAFINIL 150 MG PO TABS: 150.0000 mg | ORAL_TABLET | Freq: Every day | ORAL | 5 refills | Status: DC

## 2018-01-28 NOTE — Telephone Encounter (Signed)
ok 

## 2018-02-20 ENCOUNTER — Ambulatory Visit: Payer: BLUE CROSS/BLUE SHIELD | Admitting: Internal Medicine

## 2018-03-24 ENCOUNTER — Telehealth: Payer: Self-pay | Admitting: *Deleted

## 2018-03-24 MED ORDER — ARMODAFINIL 150 MG PO TABS: 150.0000 mg | ORAL_TABLET | Freq: Every day | ORAL | 5 refills | Status: DC

## 2018-03-24 NOTE — Telephone Encounter (Signed)
Ok Thx 

## 2018-03-24 NOTE — Telephone Encounter (Signed)
Rec'd call pt is needing refill on his Nuvigil. Check [place] registry last filled 01/30/2018.Marland KitchenJohny Chess

## 2018-03-25 NOTE — Telephone Encounter (Signed)
Notified pt rx has been sent.../lmb 

## 2018-04-02 ENCOUNTER — Ambulatory Visit: Payer: BLUE CROSS/BLUE SHIELD | Admitting: Internal Medicine

## 2018-04-02 ENCOUNTER — Ambulatory Visit (INDEPENDENT_AMBULATORY_CARE_PROVIDER_SITE_OTHER)
Admission: RE | Admit: 2018-04-02 | Discharge: 2018-04-02 | Disposition: A | Discharge status: Home / Self Care | Payer: BLUE CROSS/BLUE SHIELD | Source: Ambulatory Visit | Attending: Internal Medicine | Admitting: Internal Medicine

## 2018-04-02 ENCOUNTER — Encounter: Payer: Self-pay | Admitting: Internal Medicine

## 2018-04-02 VITALS — BP 124/70 | HR 66 | Temp 98.2°F | Ht 76.0 in | Wt 223.0 lb

## 2018-04-02 DIAGNOSIS — M542 Cervicalgia: Secondary | ICD-10-CM | POA: Diagnosis not present

## 2018-04-02 MED ORDER — MUPIROCIN 2 % EX OINT: TOPICAL_OINTMENT | CUTANEOUS | 0 refills | Status: DC

## 2018-04-02 MED ORDER — PREDNISONE 10 MG PO TABS: ORAL_TABLET | ORAL | 1 refills | Status: DC

## 2018-04-02 NOTE — Assessment & Plan Note (Signed)
S/p fusion - Dr Lorin Mercy H/o epidural injections  2015 MRI IMPRESSION: Anterior and interbody fusion changes at C5-6. No complicating features and no spinal or foraminal stenosis.  Small focal central disc protrusion at C4-5.  Prednisone 10 mg: take 4 tabs a day x 3 days; then 3 tabs a day x 4 days; then 2 tabs a day x 4 days, then 1 tab a day x 6 days, then stop. Take pc.   Potential benefits of a steroid  use as well as potential risks  and complications were explained to the patient and were aknowledged. X ray

## 2018-04-02 NOTE — Progress Notes (Signed)
Subjective:  Patient ID: Elijah Ashley, male    DOB: 29-Apr-1966  Age: 52 y.o. MRN: [id number]  CC: No chief complaint on file.   HPI Hipolito Martinezlopez Sarratt presents for burning neck pain starting 2 mo ago irrad to L upper back - L shoulder blade. C/o B arms and hands go numb L>>R 7/10 in intensity.  2015 MRI IMPRESSION: Anterior and interbody fusion changes at C5-6. No complicating features and no spinal or foraminal stenosis.  Small focal central disc protrusion at C4-5.  H/o epidural shots  Outpatient Medications Prior to Visit  Medication Sig Dispense Refill  . Armodafinil (NUVIGIL) 150 MG tablet Take 1 tablet (150 mg total) by mouth daily. Take prior to the work shift 30 tablet 5  . Cholecalciferol (VITAMIN D3) 2000 units capsule Take 1 capsule (2,000 Units total) by mouth daily. 100 capsule 3  . clonazePAM (KLONOPIN) 0.5 MG tablet Take 1/2-1 tablet by mouth as needed    . dorzolamide-timolol (COSOPT) 22.3-6.8 MG/ML ophthalmic solution Place 1 drop into both eyes 2 (two) times daily.      Marland Kitchen FLUoxetine (PROZAC) 40 MG capsule Take 1 capsule (40 mg total) by mouth daily. 90 capsule 3  . Glucosamine-Chondroit-Vit C-Mn (GLUCOSAMINE 1500 COMPLEX PO) Take by mouth as needed.    . latanoprost (XALATAN) 0.005 % ophthalmic solution Place 1 drop into both eyes at bedtime. 2.5 mL 1  . levothyroxine (SYNTHROID, LEVOTHROID) 200 MCG tablet Take 175 mcg by mouth daily.     . Omega-3 Fatty Acids (FISH OIL) 1000 MG CAPS Take by mouth daily.    Marland Kitchen OVER THE COUNTER MEDICATION Take 1 capsule by mouth daily. Ginger and tumeric daily    . traZODone (DESYREL) 50 MG tablet Take 0.5-1 tablets (25-50 mg total) by mouth at bedtime as needed for sleep. 30 tablet 2   Facility-Administered Medications Prior to Visit  Medication Dose Route Frequency Provider Last Rate Last Dose  . 0.9 %  sodium chloride infusion  500 mL Intravenous Continuous Gatha Mayer, MD        ROS Review of Systems    Constitutional: Negative for appetite change, fatigue and unexpected weight change.  HENT: Negative for congestion, nosebleeds, sneezing, sore throat and trouble swallowing.   Eyes: Negative for itching and visual disturbance.  Respiratory: Negative for cough.   Cardiovascular: Negative for chest pain, palpitations and leg swelling.  Gastrointestinal: Negative for abdominal distention, blood in stool, diarrhea and nausea.  Genitourinary: Negative for frequency and hematuria.  Musculoskeletal: Positive for neck pain. Negative for back pain, gait problem and joint swelling.  Skin: Negative for rash.  Neurological: Positive for numbness. Negative for dizziness, tremors, speech difficulty and weakness.  Psychiatric/Behavioral: Negative for agitation, dysphoric mood and sleep disturbance. The patient is not nervous/anxious.     Objective:  BP 124/70 (BP Location: Left Arm, Patient Position: Sitting, Cuff Size: Large)   Pulse 66   Temp 98.2 F (36.8 C) (Oral)   Ht 6\' 4"  (1.93 m)   Wt 223 lb (101.2 kg)   SpO2 99%   BMI 27.14 kg/m   BP Readings from Last 3 Encounters:  04/02/18 124/70  01/02/18 118/72  11/20/17 114/72    Wt Readings from Last 3 Encounters:  04/02/18 223 lb (101.2 kg)  01/02/18 226 lb (102.5 kg)  11/20/17 221 lb (100.2 kg)    Physical Exam  Constitutional: He is oriented to person, place, and time. He appears well-developed. No distress.  NAD  HENT:  Mouth/Throat: Oropharynx is clear and moist.  Eyes: Pupils are equal, round, and reactive to light. Conjunctivae are normal.  Neck: Normal range of motion. No JVD present. No thyromegaly present.  Cardiovascular: Normal rate, regular rhythm, normal heart sounds and intact distal pulses. Exam reveals no gallop and no friction rub.  No murmur heard. Pulmonary/Chest: Effort normal and breath sounds normal. No respiratory distress. He has no wheezes. He has no rales. He exhibits no tenderness.  Abdominal: Soft. Bowel  sounds are normal. He exhibits no distension and no mass. There is no tenderness. There is no rebound and no guarding.  Musculoskeletal: Normal range of motion. He exhibits tenderness. He exhibits no edema.  Lymphadenopathy:    He has no cervical adenopathy.  Neurological: He is alert and oriented to person, place, and time. He has normal reflexes. No cranial nerve deficit. He exhibits normal muscle tone. He displays a negative Romberg sign. Coordination and gait normal.  Skin: Skin is warm and dry. No rash noted.  Psychiatric: He has a normal mood and affect. His behavior is normal. Judgment and thought content normal.   parasp and traps are tender B MS ok  Lab Results  Component Value Date   WBC 8.8 10/14/2017   HGB 13.4 10/14/2017   HCT 41.2 10/14/2017   PLT 197.0 10/14/2017   GLUCOSE 83 10/14/2017   CHOL 241 (H) 11/12/2013   TRIG 150.0 (H) 11/12/2013   HDL 39.90 11/12/2013   LDLDIRECT 176.7 11/12/2013   ALT 19 11/17/2017   AST 21 11/17/2017   NA 138 10/14/2017   K 4.4 10/14/2017   CL 103 10/14/2017   CREATININE 0.92 10/14/2017   BUN 12 10/14/2017   CO2 29 10/14/2017   TSH 0.57 09/22/2014   PSA 1.61 11/17/2017   INR 1.0 06/14/2009    Mr Lumbar Spine W Wo Contrast  Result Date: 09/22/2015 CLINICAL DATA:  Chronic low back pain; acute exacerbation while lifting heavy boxes at work this evening. Bilateral lower extremity weakness. Pain radiates to lower abdomen. History of thyroid carcinoma. EXAM: MRI LUMBAR SPINE WITHOUT AND WITH CONTRAST TECHNIQUE: Multiplanar and multiecho pulse sequences of the lumbar spine were obtained without and with intravenous contrast. CONTRAST:  46mL MULTIHANCE GADOBENATE DIMEGLUMINE 529 MG/ML IV SOLN COMPARISON:  CT abdomen pelvis November 16, 2013 FINDINGS: Lumbar vertebral bodies and posterior elements are intact and aligned with maintenance of lumbar lordosis. Using the reference level of the last well-formed intervertebral disc as L5-S1, mild L4-5  disc height loss and decreased T2 signal within the disc consistent with desiccation. The remaining lumbar discs demonstrate normal morphology and signal characteristics. No STIR signal abnormality to suggest acute osseous process. No abnormal osseous or intradiscal enhancement. Conus medullaris terminates at T12-L1 and appears normal in morphology and signal characteristics. Cauda equina is unremarkable. No abnormal cord, leptomeningeal nor epidural enhancement. Included prevertebral and paraspinal soft tissues are normal. Level by level evaluation: L1-2, L2-3, L3-4: No significant disc bulge, canal stenosis or neural foraminal narrowing. L4-5: Small central disc protrusion and broad-based disc bulge, LEFT subarticular annular fissure. Mild to moderate facet arthropathy and ligamentum flavum redundancy. Minimal canal stenosis. Minimal bilateral neural foraminal narrowing. L5-S1: No significant disc bulge. Moderate to severe facet arthropathy. No canal stenosis or neural foraminal narrowing. IMPRESSION: Small L4-5 central disc protrusion and broad-based disc bulge with LEFT subarticular annular fissure. No acute lumbar spine fracture nor malalignment. Minimal canal stenosis and neural foraminal narrowing at L4-5. Electronically Signed   By: Thana Farr.D.  On: 09/22/2015 23:12    Assessment & Plan:   There are no diagnoses linked to this encounter. I am having Oda Kilts. Jenson maintain his dorzolamide-timolol, latanoprost, clonazePAM, levothyroxine, Fish Oil, Glucosamine-Chondroit-Vit C-Mn (GLUCOSAMINE 1500 COMPLEX PO), traZODone, FLUoxetine, Vitamin D3, OVER THE COUNTER MEDICATION, and Armodafinil. We will continue to administer sodium chloride.  No orders of the defined types were placed in this encounter.    Follow-up: No follow-ups on file.  Walker Kehr, MD

## 2018-04-02 NOTE — Patient Instructions (Addendum)
McKenzie lumbar pillow - inflatable McKenzie neck pillow  Ice and heat

## 2018-04-08 ENCOUNTER — Telehealth: Payer: Self-pay

## 2018-04-08 DIAGNOSIS — M542 Cervicalgia: Secondary | ICD-10-CM

## 2018-04-08 NOTE — Telephone Encounter (Signed)
When speaking with pt about xray results, he wanted to know what is the next step to figure out what is causing the pain. Please advise

## 2018-04-09 NOTE — Telephone Encounter (Signed)
The next step would be to see a neck surgeon Thx

## 2018-04-09 NOTE — Telephone Encounter (Signed)
(  Routing to Dr. Alain Marion)  Spoke with patient today and he would like to proceed with seeing a neck surgeon. He has seen Dr. Marlou Sa with [facility] in the past and would like to go back to his office. However he said if Dr. Marlou Sa was not a neck specialist he would be willing to see another provider in their office that specialized in spine incase he needed to have surgery.  Please proceed with referral.  Thanks

## 2018-04-10 NOTE — Telephone Encounter (Addendum)
Pls remind me who operated on his neck before Thx

## 2018-04-13 NOTE — Telephone Encounter (Signed)
Wife return call back she states Dr. Lorin Mercy @ [facility].Marland KitchenJohny Chess

## 2018-04-13 NOTE — Addendum Note (Signed)
Addended by: Cassandria Anger on: 04/13/2018 02:09 PM   Modules accepted: Orders

## 2018-04-13 NOTE — Telephone Encounter (Signed)
Called pt/wife no answer LMOM RTC.../lmb 

## 2018-04-13 NOTE — Telephone Encounter (Signed)
Please see patient's response.  Thanks

## 2018-04-13 NOTE — Telephone Encounter (Signed)
I thought so too. I'll place a referral  Thx

## 2018-04-15 ENCOUNTER — Ambulatory Visit: Payer: BLUE CROSS/BLUE SHIELD | Admitting: Internal Medicine

## 2018-04-29 ENCOUNTER — Ambulatory Visit (INDEPENDENT_AMBULATORY_CARE_PROVIDER_SITE_OTHER): Payer: BLUE CROSS/BLUE SHIELD | Admitting: Orthopaedic Surgery

## 2018-04-29 ENCOUNTER — Encounter (INDEPENDENT_AMBULATORY_CARE_PROVIDER_SITE_OTHER): Payer: Self-pay | Admitting: Orthopaedic Surgery

## 2018-04-29 VITALS — BP 101/69 | HR 65

## 2018-04-29 DIAGNOSIS — Z981 Arthrodesis status: Secondary | ICD-10-CM

## 2018-04-29 DIAGNOSIS — M542 Cervicalgia: Secondary | ICD-10-CM

## 2018-04-29 MED ORDER — CYCLOBENZAPRINE HCL 10 MG PO TABS: 10.0000 mg | ORAL_TABLET | Freq: Every day | ORAL | 0 refills | Status: DC

## 2018-04-29 NOTE — Progress Notes (Signed)
Office Visit Note/orthopedic consultation requested by Dr. Alain Marion   Patient: Elijah Ashley           Date of Birth: Jan 24, 1966           MRN: [id number] Visit Date: 04/29/2018              Requested by: Cassandria Anger, MD [place], [place] [address] PCP: Cassandria Anger, MD   Assessment & Plan: Visit Diagnoses:  1. S/P cervical spinal fusion   2. Cervicalgia     Plan: Patient is having significant symptoms more on the right than the left with previous solid C5-6 fusion.  Previous MRI 4 years ago showed disc protrusion at C4-5 and this may have progressed.  He did have mild mass-effect on the thecal sac in 2015.  Will obtain a new MRI scan cervical spine for review and see him back after the scan.  Thank you for the opportunity to see him in consultation.  Flexeril was sent in 10 mg that he can take 1 at night as needed.  Follow-Up Instructions: No follow-ups on file.   Orders:  Orders Placed This Encounter  Procedures  . MR Cervical Spine w/o contrast   Meds ordered this encounter  Medications  . cyclobenzaprine (FLEXERIL) 10 MG tablet    Sig: Take 1 tablet (10 mg total) by mouth at bedtime.    Dispense:  30 tablet    Refill:  0      Procedures: No procedures performed   Clinical Data: No additional findings.   Subjective: Chief Complaint  Patient presents with  . Neck - Pain    HPI 52 year old male with previous C5-6 cervical fusion by me in 2008 burning neck pain that radiates to the left upper back left shoulder with numbness in both hands worse on the left than right.  Has had recent prednisone pack but no muscle relaxants.  He did not get relief with the prednisone.  He states the pain is worse with rotation of his neck radiating more to the right shoulder than left.  Radiates to the superior medial border of his scapula.  He is also had some low back pain and pain that runs down his left leg.  MRI lumbar 2006 showed central L4-5  protrusion disc bulge worse on the left.  Minimal central narrowing.  Patient denies chills or fever no bowel bladder symptoms no history of acute injury.  Dr. Judeen Hammans office visit notes were from 04/02/2018.  Plain radiographs at that time showed solid fusion at C5-6 with mild spurring at C6-7.  Previous cervical MRI 2015 showed solid fusion at C5-6 with small disc protrusion at C 4-5.  Review of Systems positive for history of C5-6 fusion 2008.  Lumbar L4-5 left disc protrusion.  Bipolar disorder with depression.  History of colitis.  Previous thyroid surgery with hypothyroidism.  Positive for thyroid cancer glaucoma, and anxiety.  14 point review of systems otherwise negative.   Objective: Vital Signs: BP 101/69   Pulse 65   Physical Exam  Constitutional: He is oriented to person, place, and time. He appears well-developed and well-nourished.  HENT:  Head: Normocephalic and atraumatic.  Eyes: Pupils are equal, round, and reactive to light. EOM are normal.  Neck: No tracheal deviation present. No thyromegaly present.  Cardiovascular: Normal rate.  Pulmonary/Chest: Effort normal. He has no wheezes.  Abdominal: Soft. Bowel sounds are normal.  Neurological: He is alert and oriented to person, place, and  time.  Skin: Skin is warm and dry. Capillary refill takes less than 2 seconds.  Psychiatric: He has a normal mood and affect. His behavior is normal. Judgment and thought content normal.    Ortho Exam patient has negative Lhermitte.  Some increased pain with cervical compression.  Well-healed anterior cervical incision no supraclavicular lymphadenopathy.  Thyroidectomy incision well-healed.  Or brachial plexus tenderness on the right than the left positive Spurling.  No thenar or hyperthenar atrophy.  He has some increased discomfort with cervical extension.  Some pain with straight leg raising on the left and mild sciatic notch tenderness on the left.  Shoulder exam shows no impingement.   Normal wrist flexion extension no interosseous atrophy.  Specialty Comments:  No specialty comments available.  Imaging: No results found.   PMFS History: Patient Active Problem List   Diagnosis Date Noted  . S/P cervical spinal fusion 05/11/2018  . Cervical pain (neck) 04/02/2018  . Ingrowing hair 01/02/2018  . Elevated LFTs 11/20/2017  . Urinary tract infection 10/14/2017  . Fever and chills 10/14/2017  . Paresthesia 10/14/2017  . Hx of adenomatous polyp of colon 10/02/2017  . Family history of colon cancer - brother 60's and mother 52's 09/24/2017  . Bipolar depression ([place]) 03/20/2017  . Overweight (BMI 25.0-29.9) 03/20/2017  . Family history of colitis 03/20/2017  . Patellar tendinitis 08/04/2015  . Anxiety   . Glaucoma   . URINARY RETENTION 09/17/2010  . Hypothyroidism 09/12/2010  . LOW BACK PAIN 09/12/2010   Past Medical History:  Diagnosis Date  . Allergy   . Anxiety   . Depression   . GERD (gastroesophageal reflux disease)    past hx- some now that comes and goes  . Glaucoma (increased eye pressure)   . Hemorrhoids    pt states he gets regular flares with these   . Hx of adenomatous polyp of colon 10/02/2017  . Hyperlipidemia    slightly elevated- working on diet and exercise, no meds   . HYPOTHYROIDISM    postsurgical  . LOW BACK PAIN    L4-5 degen disc on MRI - s/p ESI 09/2010  . 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 Psoriasis   . URINARY RETENTION     Family History  Problem Relation Age of Onset  . Breast cancer Other   . Cervical cancer Other   . Colon cancer Other   . Hypertension Other   . Ulcerative colitis Sister   . Diabetes Father   . Pancreatic cancer Sister 30       ? colon involvement  . Colon cancer Brother 51       50's  . Colon cancer Mother 60       70's  . Rectal cancer Neg Hx   . Stomach cancer Neg Hx     Past Surgical History:  Procedure Laterality Date    . CERVICAL LAMINECTOMY  07/2007   C5-6 ACDF due to myelopathy , C3-6 per report   . TOTAL THYROIDECTOMY  03/2009   graves and cold nodule (papillary ca)   Social History   Occupational History  . Occupation: SORTER    Employer: [employer]  Tobacco Use  . Smoking status: Never Smoker  . Smokeless tobacco: Never Used  Substance and Sexual Activity  . Alcohol use: Yes    Comment: socially only   . Drug use: No  . Sexual activity: Yes

## 2018-05-01 ENCOUNTER — Telehealth: Payer: Self-pay | Admitting: *Deleted

## 2018-05-01 MED ORDER — ARMODAFINIL 200 MG PO TABS: ORAL_TABLET | ORAL | 3 refills | Status: DC

## 2018-05-01 NOTE — Telephone Encounter (Signed)
Ok - 200 mg Rx emailed Thx

## 2018-05-01 NOTE — Telephone Encounter (Signed)
r'd call from pt wife stating pt is wanting to increase his Armodafinil 150 mg to a higher mg. The 150 mg no longer works for him. Pls advise.Marland KitchenJohny Chess

## 2018-05-04 NOTE — Telephone Encounter (Signed)
Notified pt wife MD increase med and sent rx to [facility].Marland KitchenJohny Ashley

## 2018-05-11 ENCOUNTER — Encounter (INDEPENDENT_AMBULATORY_CARE_PROVIDER_SITE_OTHER): Payer: Self-pay | Admitting: Orthopaedic Surgery

## 2018-05-11 DIAGNOSIS — Z981 Arthrodesis status: Secondary | ICD-10-CM | POA: Insufficient documentation

## 2018-05-19 ENCOUNTER — Ambulatory Visit (INDEPENDENT_AMBULATORY_CARE_PROVIDER_SITE_OTHER): Payer: BLUE CROSS/BLUE SHIELD | Admitting: Orthopaedic Surgery

## 2018-06-03 ENCOUNTER — Ambulatory Visit (INDEPENDENT_AMBULATORY_CARE_PROVIDER_SITE_OTHER): Payer: BLUE CROSS/BLUE SHIELD | Admitting: Orthopaedic Surgery

## 2018-06-16 ENCOUNTER — Ambulatory Visit (INDEPENDENT_AMBULATORY_CARE_PROVIDER_SITE_OTHER): Payer: BLUE CROSS/BLUE SHIELD | Admitting: Orthopaedic Surgery

## 2018-06-30 ENCOUNTER — Ambulatory Visit (INDEPENDENT_AMBULATORY_CARE_PROVIDER_SITE_OTHER): Payer: BLUE CROSS/BLUE SHIELD | Admitting: Orthopaedic Surgery

## 2018-06-30 ENCOUNTER — Encounter (INDEPENDENT_AMBULATORY_CARE_PROVIDER_SITE_OTHER): Payer: Self-pay | Admitting: Orthopaedic Surgery

## 2018-06-30 VITALS — BP 105/73 | HR 69 | Ht 76.0 in | Wt 222.2 lb

## 2018-06-30 DIAGNOSIS — M5020 Other cervical disc displacement, unspecified cervical region: Secondary | ICD-10-CM | POA: Diagnosis not present

## 2018-06-30 DIAGNOSIS — Z981 Arthrodesis status: Secondary | ICD-10-CM | POA: Diagnosis not present

## 2018-06-30 MED ORDER — CYCLOBENZAPRINE HCL 10 MG PO TABS: 10.0000 mg | ORAL_TABLET | Freq: Every day | ORAL | 0 refills | Status: DC

## 2018-06-30 NOTE — Progress Notes (Signed)
Office Visit Note   Patient: Elijah Ashley           Date of Birth: [birth year]           MRN: [id number] Visit Date: 06/30/2018              Requested by: Cassandria Anger, MD [place], [place] [address] PCP: Cassandria Anger, MD   Assessment & Plan: Visit Diagnoses:  1. Protrusion of cervical intervertebral disc   2. S/P cervical spinal fusion     Plan: Currently patient is improved and has less symptoms.  We reviewed the MRI scan I gave him a copy of the report.  We discussed plate removal at D4-0 and single level fusion at C4-5.  Currently his symptoms have settled down some I plan to recheck him in 3 months.  Flexeril 10 mg renewed at patient's request.  Follow-Up Instructions: Return in about 3 months (around 09/30/2018).   Orders:  No orders of the defined types were placed in this encounter.  Meds ordered this encounter  Medications  . cyclobenzaprine (FLEXERIL) 10 MG tablet    Sig: Take 1 tablet (10 mg total) by mouth at bedtime.    Dispense:  30 tablet    Refill:  0      Procedures: No procedures performed   Clinical Data: No additional findings.   Subjective: Chief Complaint  Patient presents with  . Neck - Follow-up    MRI review     HPI 52 year old male returns with ongoing problems with neck and shoulder pain.  He states the muscle relaxant has helped taking Flexeril at night which is helping him sleep.  When he is active or works out too much he has had significant increased pain.  He states that at times the pain is been very severe but recently after the prednisone pack and less lifting his neck pain is significantly improved.  He states currently his symptoms are only mild.  Review of Systems 14 point review of systems updated unchanged from 04/29/2018 office visit.  Previous lumbar disc protrusion.  Cervical fusion 2008 at C5-6.  Positive for depression with bipolar disorder.   Objective: Vital Signs: BP 105/73 (BP  Location: Left Arm, Patient Position: Sitting, Cuff Size: Large)   Pulse 69   Ht 6\' 4"  (1.93 m)   Wt 222 lb 3.2 oz (100.8 kg)   BMI 27.05 kg/m   Physical Exam  Constitutional: He is oriented to person, place, and time. He appears well-developed and well-nourished.  HENT:  Head: Normocephalic and atraumatic.  Eyes: Pupils are equal, round, and reactive to light. EOM are normal.  Neck: No tracheal deviation present. No thyromegaly present.  Cardiovascular: Normal rate.  Pulmonary/Chest: Effort normal. He has no wheezes.  Abdominal: Soft. Bowel sounds are normal.  Neurological: He is alert and oriented to person, place, and time.  Skin: Skin is warm and dry. Capillary refill takes less than 2 seconds.  Psychiatric: He has a normal mood and affect. His behavior is normal. Judgment and thought content normal.    Ortho Exam patient has some pain with cervical range of motion.  Previous thyroidectomy incision well-healed.  Some brachial plexus tenderness worse on the left than right.  No upper extremity atrophy.  Negative shoulder impingement.  No interosseous atrophy.  Normal lower extremity gait.  No clonus.  Specialty Comments:  No specialty comments available.  Imaging: MRI scan shows solid fusion at C5-6 with central and slightly  left paracentral disc protrusion at C4-5.  This just touches the cord without deformity.  There is loss of anterior CSF line.  Other levels show no significant compression.   PMFS History: Patient Active Problem List   Diagnosis Date Noted  . Protrusion of cervical intervertebral disc 06/30/2018  . S/P cervical spinal fusion 05/11/2018  . Cervical pain (neck) 04/02/2018  . Ingrowing hair 01/02/2018  . Elevated LFTs 11/20/2017  . Urinary tract infection 10/14/2017  . Fever and chills 10/14/2017  . Paresthesia 10/14/2017  . Hx of adenomatous polyp of colon 10/02/2017  . Family history of colon cancer - brother 74's and mother 73's 09/24/2017  . Bipolar  depression ([place]) 03/20/2017  . Overweight (BMI 25.0-29.9) 03/20/2017  . Family history of colitis 03/20/2017  . Patellar tendinitis 08/04/2015  . Anxiety   . Glaucoma   . URINARY RETENTION 09/17/2010  . Hypothyroidism 09/12/2010  . LOW BACK PAIN 09/12/2010   Past Medical History:  Diagnosis Date  . Allergy   . Anxiety   . Depression   . GERD (gastroesophageal reflux disease)    past hx- some now that comes and goes  . Glaucoma (increased eye pressure)   . Hemorrhoids    pt states he gets regular flares with these   . Hx of adenomatous polyp of colon 10/02/2017  . Hyperlipidemia    slightly elevated- working on diet and exercise, no meds   . HYPOTHYROIDISM    postsurgical  . LOW BACK PAIN    L4-5 degen disc on MRI - s/p ESI 09/2010  . 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 Psoriasis   . URINARY RETENTION     Family History  Problem Relation Age of Onset  . Breast cancer Other   . Cervical cancer Other   . Colon cancer Other   . Hypertension Other   . Ulcerative colitis Sister   . Diabetes Father   . Pancreatic cancer Sister 71       ? colon involvement  . Colon cancer Brother 46       50's  . Colon cancer Mother 31       70's  . Rectal cancer Neg Hx   . Stomach cancer Neg Hx     Past Surgical History:  Procedure Laterality Date  . CERVICAL LAMINECTOMY  07/2007   C5-6 ACDF due to myelopathy , C3-6 per report   . TOTAL THYROIDECTOMY  03/2009   graves and cold nodule (papillary ca)   Social History   Occupational History  . Occupation: SORTER    Employer: [employer]  Tobacco Use  . Smoking status: Never Smoker  . Smokeless tobacco: Never Used  Substance and Sexual Activity  . Alcohol use: Yes    Comment: socially only   . Drug use: No  . Sexual activity: Yes

## 2018-08-17 ENCOUNTER — Other Ambulatory Visit (INDEPENDENT_AMBULATORY_CARE_PROVIDER_SITE_OTHER): Payer: Self-pay | Admitting: Orthopaedic Surgery

## 2018-08-18 NOTE — Telephone Encounter (Signed)
Ok for refill? 

## 2018-09-08 ENCOUNTER — Telehealth (INDEPENDENT_AMBULATORY_CARE_PROVIDER_SITE_OTHER): Payer: Self-pay | Admitting: Orthopaedic Surgery

## 2018-09-08 NOTE — Telephone Encounter (Signed)
Please call, was it flexeril or prednisone dose pack he was requesting?

## 2018-09-08 NOTE — Telephone Encounter (Signed)
Medication refill  Prednisone   New Pharmacy  [facility]   [phone]- [phone]

## 2018-09-08 NOTE — Telephone Encounter (Signed)
Please advise 

## 2018-09-09 MED ORDER — PREDNISONE 10 MG (21) PO TBPK: ORAL_TABLET | ORAL | 0 refills | Status: DC

## 2018-09-09 NOTE — Telephone Encounter (Signed)
oK THANKS 

## 2018-09-09 NOTE — Telephone Encounter (Signed)
Sent to pharmacy 

## 2018-09-09 NOTE — Telephone Encounter (Signed)
I left voicemail for patient advising. 

## 2018-09-09 NOTE — Telephone Encounter (Signed)
Patient is requesting refill on prednisone taper.  Please advise.

## 2018-09-30 ENCOUNTER — Ambulatory Visit (INDEPENDENT_AMBULATORY_CARE_PROVIDER_SITE_OTHER): Payer: BLUE CROSS/BLUE SHIELD | Admitting: Orthopaedic Surgery

## 2018-10-08 ENCOUNTER — Telehealth: Payer: Self-pay

## 2018-10-08 DIAGNOSIS — R5383 Other fatigue: Secondary | ICD-10-CM

## 2018-10-08 DIAGNOSIS — Z0000 Encounter for general adult medical examination without abnormal findings: Secondary | ICD-10-CM

## 2018-10-08 NOTE — Telephone Encounter (Signed)
Please advise  Copied from [place] [id number]. Topic: General - Inquiry >> Oct 08, 2018  3:21 PM [name] B, [place] wrote: Reason for CRM: Patient calling and would like to know if Dr Alain Marion could add labs to get thyroid levels, blood sugar, and testosterone levels checked. States taht he has been having headaches and nose bleeds on and off for several months. Patient has an appointment on 11/24/18

## 2018-10-09 NOTE — Telephone Encounter (Signed)
Done. Thx.

## 2018-10-18 ENCOUNTER — Encounter: Payer: Self-pay | Admitting: Emergency Medicine

## 2018-10-18 DIAGNOSIS — F3342 Major depressive disorder, recurrent, in full remission: Secondary | ICD-10-CM

## 2018-10-18 DIAGNOSIS — F330 Major depressive disorder, recurrent, mild: Secondary | ICD-10-CM | POA: Insufficient documentation

## 2018-10-18 DIAGNOSIS — F411 Generalized anxiety disorder: Secondary | ICD-10-CM | POA: Insufficient documentation

## 2018-11-04 ENCOUNTER — Encounter: Payer: Self-pay | Admitting: Psychiatry

## 2018-11-04 ENCOUNTER — Ambulatory Visit: Payer: BLUE CROSS/BLUE SHIELD | Admitting: Psychiatry

## 2018-11-04 VITALS — BP 114/78 | HR 76 | Ht 77.0 in | Wt 243.0 lb

## 2018-11-04 DIAGNOSIS — F411 Generalized anxiety disorder: Secondary | ICD-10-CM

## 2018-11-04 DIAGNOSIS — F3342 Major depressive disorder, recurrent, in full remission: Secondary | ICD-10-CM | POA: Diagnosis not present

## 2018-11-04 MED ORDER — BUPROPION HCL ER (XL) 150 MG PO TB24: 150.0000 mg | ORAL_TABLET | Freq: Every day | ORAL | 1 refills | Status: DC

## 2018-11-04 MED ORDER — CLONAZEPAM 1 MG PO TABS: ORAL_TABLET | ORAL | 0 refills | Status: DC

## 2018-11-04 MED ORDER — FLUOXETINE HCL 40 MG PO CAPS: 40.0000 mg | ORAL_CAPSULE | Freq: Every day | ORAL | 1 refills | Status: DC

## 2018-11-04 NOTE — Progress Notes (Signed)
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  Somatic symptoms approach somatic symptom disorder classification as he describes having armodafinil for being more alert and motivated during work while questioning whether his clonazepam at bedtime is too relaxing asking whether he can take it during work when he already does take it right at the first stop of his shift as well as often taking an armodafinil at the same time.  He has a long list of medications currently taking Flexeril as part of a 10 mg tablet to sleep when he gets off work which is usually in the day.  He has the new night job with [employer] and reviews that mother has dementia in the [place] still, though he has not visited.  He is drinking pure Moringa homeopathic biological mixture of coconut, avocado, and hemp.  He is concerned about his 9 pound weight gain he attributes to driving on the job and not being therefore is active.  He questions the purpose of Prozac and Wellbutrin as though dependent and less insightful.  He acknowledges that stepson is doing better likely to get off probation soon by having been in therapy and will likely secure another job after job loss.  Anxiety  Presents for follow-up visit. Symptoms include compulsions, decreased concentration, depressed mood, excessive worry, irritability, muscle tension, nervous/anxious behavior and restlessness. Patient reports no hyperventilation, insomnia, obsessions, panic or suicidal ideas. Symptoms occur most days. The most recent episode lasted 3 hours.  The severity of symptoms is moderate. The patient sleeps 5 hours per night. The quality of sleep is poor. Nighttime awakenings: one to two.   His past medical history is significant for depression. There is no history of suicide attempts. Compliance with medications is 51-75%.  Depression       The patient presents with depression.  The current episode started more than 1 year ago.   The onset quality is gradual.   The problem occurs intermittently.  The most recent episode lasted 6 weeks.    The problem has been waxing and waning since onset.  Associated symptoms include decreased concentration, helplessness, restlessness, appetite change, body aches and indigestion.  Associated symptoms include does not have insomnia and no suicidal ideas.     The symptoms are aggravated by work stress, medication, social issues and family issues.  Past treatments include SSRIs - Selective serotonin reuptake inhibitors and other medications.  Compliance with treatment is variable.  Past compliance problems include difficulty understanding directions, difficulty with treatment plan, medication issues and medical issues.  Previous treatment provided moderate relief.  Risk factors include family history, a change in medication usage/dosage, history of mental illness, major life event and stress.   Past medical history includes anxiety, depression and mental health disorder.     Pertinent negatives include no chronic fatigue syndrome, no hypothyroidism, no chronic illness, no life-threatening condition, no physical disability, no recent psychiatric admission, no brain trauma, no obsessive-compulsive disorder, no post-traumatic stress disorder, no schizophrenia, no suicide attempts and no head trauma.   Individual Medical History/ Review of Systems: Changes? :No   Allergies: Patient has no known allergies.  Current Medications:  Current Outpatient Medications:  .  Armodafinil 200 MG TABS, 1 tab prior to work shift, Disp:  30 tablet, Rfl: 3 .  buPROPion (WELLBUTRIN XL) 150 MG 24 hr tablet, Take 1 tablet (150 mg total) by mouth daily., Disp: 90 tablet, Rfl: 1 .  Cholecalciferol (VITAMIN D3) 2000 units capsule, Take 1 capsule (2,000 Units total) by mouth daily., Disp: 100 capsule, Rfl: 3 .  clonazePAM (KLONOPIN) 1 MG tablet, TAKE 1 TABLET BY MOUTH TWICE A DAY AS NEEDED FOR ANXIETY, Disp: 180 tablet, Rfl: 0 .  cyclobenzaprine (FLEXERIL) 10 MG tablet, TAKE 1 TABLET BY MOUTH EVERYDAY AT BEDTIME, Disp: 30 tablet, Rfl: 0 .  dorzolamide-timolol (COSOPT) 22.3-6.8 MG/ML ophthalmic solution, Place 1 drop into both eyes 2 (two) times daily.  , Disp: , Rfl:  .  FLUoxetine (PROZAC) 40 MG capsule, Take 1 capsule (40 mg total) by mouth daily., Disp: 90 capsule, Rfl: 1 .  Glucosamine-Chondroit-Vit C-Mn (GLUCOSAMINE 1500 COMPLEX PO), Take by mouth as needed., Disp: , Rfl:  .  latanoprost (XALATAN) 0.005 % ophthalmic solution, Place 1 drop into both eyes at bedtime., Disp: 2.5 mL, Rfl: 1 .  levothyroxine (SYNTHROID, LEVOTHROID) 175 MCG tablet, 1 TABLET EVERY MORNING ON AN EMPTY STOMACH ONCE A DAY EVERY DAY ORALLY 90 DAYS, Disp: , Rfl: 3 .  levothyroxine (SYNTHROID, LEVOTHROID) 200 MCG tablet, Take 175 mcg by mouth daily. , Disp: , Rfl:  .  mupirocin ointment (BACTROBAN) 2 %, Use qid R nostril, Disp: 15 g, Rfl: 0 .  Omega-3 Fatty Acids (FISH OIL) 1000 MG CAPS, Take by mouth daily., Disp: , Rfl:  .  OVER THE COUNTER MEDICATION, Take 1 capsule by mouth daily. Ginger and tumeric daily, Disp: , Rfl:  .  predniSONE (DELTASONE) 10 MG tablet, Prednisone 10 mg: take 4 tabs a day x 3 days; then 3 tabs a day x 4 days; then 2 tabs a day x 4 days, then 1 tab a day x 6 days, then stop. Take pc., Disp: 38 tablet, Rfl: 1 .  predniSONE (STERAPRED UNI-PAK 21 TAB) 10 MG (21) TBPK tablet, Take as directed., Disp: 21 tablet, Rfl: 0  Current Facility-Administered Medications:  .  0.9 %  sodium chloride infusion, 500 mL, Intravenous, Continuous, Carlean Purl  Ofilia Neas, MD Medication Side Effects: none  Family Medical/ Social History: Changes?  Yes: Niece bipolar, mother dementia, and other relatives with character disorder  MENTAL HEALTH EXAM: Muscle strengths 5/5, postural reflexes/0, and AIMS = 0. Blood pressure 114/78, pulse 76, height 6\' 5"  (1.956 m), weight 243 lb (110.2 kg).Body mass index is 28.82 kg/m.  General Appearance: Fairly Groomed and Guarded  Eye Contact:  Good  Speech:  Clear and Coherent and Talkative  Volume:  Increased  Mood:  Anxious, Dysphoric and Irritable  Affect:  Constricted, Full Range and Anxious  Thought Process:  Goal Directed  Orientation:  Full (Time, Place, and Person)  Thought Content: Obsessions and Rumination   Suicidal Thoughts:  No  Homicidal Thoughts:  No  Memory:  Immediate;   Good Remote;   Good  Judgement:  Fair  Insight:  Lacking  Psychomotor Activity:  Decreased  Concentration:  Concentration: Fair and Attention Span: Good  Recall:  Good  Fund of Knowledge: Good  Language: Fair  Assets:  Desire for Improvement Leisure Time Social Support  ADL's:  Intact  Cognition: WNL  Prognosis:  Good    DIAGNOSES:    ICD-10-CM   1. Generalized anxiety disorder F41.1 clonazePAM (KLONOPIN)  1 MG tablet    buPROPion (WELLBUTRIN XL) 150 MG 24 hr tablet    FLUoxetine (PROZAC) 40 MG capsule  2. Major depressive disorder, recurrent episode, in full remission ([place]) F33.42 buPROPion (WELLBUTRIN XL) 150 MG 24 hr tablet    FLUoxetine (PROZAC) 40 MG capsule    Receiving Psychotherapy: No    RECOMMENDATIONS: Patient allows clarification of strengths and vulnerabilities for work, family relations, and general health.  Patient is not currently in therapy but knows that stepson is doing well in such and the availability of therapy here with [facility] Inch, [facility] for himself.  He can understand with education that Wellbutrin may be more effective than the armodafinil and the acceptability of as needed Klonopin  though primarily after work better than the [employer] or [employer].  Prozac should be continued for mood and anxiety.  He is prescribed to continue Prozac 40 mg daily after work and Wellbutrin 150 mg XL before work as a 90-day supply each and 1 refill for anxiety and depression.  He continues Klonopin 1 mg twice daily as needed having a significant supply currently by recent refill but sending #180 [facility] [address] with his other prescriptions.  He returns in 6 months.   Delight Hoh, MD

## 2018-11-09 ENCOUNTER — Ambulatory Visit: Payer: BLUE CROSS/BLUE SHIELD | Admitting: Internal Medicine

## 2018-11-09 ENCOUNTER — Encounter: Payer: Self-pay | Admitting: Internal Medicine

## 2018-11-09 VITALS — BP 124/82 | HR 78 | Temp 97.7°F | Ht 77.0 in | Wt 239.0 lb

## 2018-11-09 DIAGNOSIS — E785 Hyperlipidemia, unspecified: Secondary | ICD-10-CM

## 2018-11-09 DIAGNOSIS — Z23 Encounter for immunization: Secondary | ICD-10-CM

## 2018-11-09 DIAGNOSIS — F419 Anxiety disorder, unspecified: Secondary | ICD-10-CM | POA: Diagnosis not present

## 2018-11-09 DIAGNOSIS — R490 Dysphonia: Secondary | ICD-10-CM

## 2018-11-09 DIAGNOSIS — N320 Bladder-neck obstruction: Secondary | ICD-10-CM

## 2018-11-09 DIAGNOSIS — E890 Postprocedural hypothyroidism: Secondary | ICD-10-CM

## 2018-11-09 DIAGNOSIS — F319 Bipolar disorder, unspecified: Secondary | ICD-10-CM

## 2018-11-09 NOTE — Assessment & Plan Note (Signed)
F/u w/Dr Buddy Duty  Cont w/Levothroid

## 2018-11-09 NOTE — Progress Notes (Signed)
Subjective:  Patient ID: Elijah Ashley, male    DOB: 30-Dec-1965  Age: 52 y.o. MRN: [id number]  CC: No chief complaint on file.   HPI Elijah Ashley presents for ST and hoarseness x 12 mo+, s/p thyroid surgery, depression f/u  Outpatient Medications Prior to Visit  Medication Sig Dispense Refill  . buPROPion (WELLBUTRIN XL) 150 MG 24 hr tablet Take 1 tablet (150 mg total) by mouth daily. 90 tablet 1  . Cholecalciferol (VITAMIN D3) 2000 units capsule Take 1 capsule (2,000 Units total) by mouth daily. 100 capsule 3  . clonazePAM (KLONOPIN) 1 MG tablet TAKE 1 TABLET BY MOUTH TWICE A DAY AS NEEDED FOR ANXIETY 180 tablet 0  . cyclobenzaprine (FLEXERIL) 10 MG tablet TAKE 1 TABLET BY MOUTH EVERYDAY AT BEDTIME 30 tablet 0  . dorzolamide-timolol (COSOPT) 22.3-6.8 MG/ML ophthalmic solution Place 1 drop into both eyes 2 (two) times daily.      Marland Kitchen FLUoxetine (PROZAC) 40 MG capsule Take 1 capsule (40 mg total) by mouth daily. 90 capsule 1  . Glucosamine-Chondroit-Vit C-Mn (GLUCOSAMINE 1500 COMPLEX PO) Take by mouth as needed.    . latanoprost (XALATAN) 0.005 % ophthalmic solution Place 1 drop into both eyes at bedtime. 2.5 mL 1  . levothyroxine (SYNTHROID, LEVOTHROID) 175 MCG tablet 1 TABLET EVERY MORNING ON AN EMPTY STOMACH ONCE A DAY EVERY DAY ORALLY 90 DAYS  3  . levothyroxine (SYNTHROID, LEVOTHROID) 200 MCG tablet Take 175 mcg by mouth daily.     . mupirocin ointment (BACTROBAN) 2 % Use qid R nostril 15 g 0  . Omega-3 Fatty Acids (FISH OIL) 1000 MG CAPS Take by mouth daily.    Marland Kitchen OVER THE COUNTER MEDICATION Take 1 capsule by mouth daily. Ginger and tumeric daily    . predniSONE (STERAPRED UNI-PAK 21 TAB) 10 MG (21) TBPK tablet Take as directed. 21 tablet 0  . Armodafinil 200 MG TABS 1 tab prior to work shift (Patient not taking: Reported on 11/09/2018) 30 tablet 3  . predniSONE (DELTASONE) 10 MG tablet Prednisone 10 mg: take 4 tabs a day x 3 days; then 3 tabs a day x 4 days; then 2 tabs a  day x 4 days, then 1 tab a day x 6 days, then stop. Take pc. (Patient not taking: Reported on 11/09/2018) 38 tablet 1   Facility-Administered Medications Prior to Visit  Medication Dose Route Frequency Provider Last Rate Last Dose  . 0.9 %  sodium chloride infusion  500 mL Intravenous Continuous Gatha Mayer, MD        ROS: Review of Systems  Constitutional: Negative for appetite change, fatigue and unexpected weight change.  HENT: Positive for sore throat and voice change. Negative for congestion, nosebleeds, sneezing and trouble swallowing.   Eyes: Negative for itching and visual disturbance.  Respiratory: Positive for wheezing. Negative for cough.   Cardiovascular: Negative for chest pain, palpitations and leg swelling.  Gastrointestinal: Negative for abdominal distention, blood in stool, diarrhea and nausea.  Genitourinary: Positive for urgency. Negative for frequency and hematuria.  Musculoskeletal: Negative for back pain, gait problem, joint swelling and neck pain.  Skin: Negative for rash.  Neurological: Negative for dizziness, tremors, speech difficulty and weakness.  Psychiatric/Behavioral: Negative for agitation, dysphoric mood and sleep disturbance. The patient is not nervous/anxious.     Objective:  BP 124/82 (BP Location: Right Arm, Patient Position: Sitting, Cuff Size: Large)   Pulse 78   Temp 97.7 F (36.5 C) (Oral)   Ht 6'  5" (1.956 m)   Wt 239 lb (108.4 kg)   SpO2 97%   BMI 28.34 kg/m   BP Readings from Last 3 Encounters:  11/09/18 124/82  06/30/18 105/73  04/29/18 101/69    Wt Readings from Last 3 Encounters:  11/09/18 239 lb (108.4 kg)  06/30/18 222 lb 3.2 oz (100.8 kg)  04/02/18 223 lb (101.2 kg)    Physical Exam  Constitutional: He is oriented to person, place, and time. He appears well-developed. No distress.  NAD  HENT:  Mouth/Throat: Oropharynx is clear and moist.  Eyes: Pupils are equal, round, and reactive to light. Conjunctivae are  normal.  Neck: Normal range of motion. No JVD present. No thyromegaly present.  Cardiovascular: Normal rate, regular rhythm, normal heart sounds and intact distal pulses. Exam reveals no gallop and no friction rub.  No murmur heard. Pulmonary/Chest: Effort normal and breath sounds normal. No respiratory distress. He has no wheezes. He has no rales. He exhibits no tenderness.  Abdominal: Soft. Bowel sounds are normal. He exhibits no distension and no mass. There is no tenderness. There is no rebound and no guarding.  Musculoskeletal: Normal range of motion. He exhibits no edema or tenderness.  Lymphadenopathy:    He has no cervical adenopathy.  Neurological: He is alert and oriented to person, place, and time. He has normal reflexes. No cranial nerve deficit. He exhibits normal muscle tone. He displays a negative Romberg sign. Coordination and gait normal.  Skin: Skin is warm and dry. No rash noted.  Psychiatric: He has a normal mood and affect. His behavior is normal. Judgment and thought content normal.  hoarse a little  Lab Results  Component Value Date   WBC 8.8 10/14/2017   HGB 13.4 10/14/2017   HCT 41.2 10/14/2017   PLT 197.0 10/14/2017   GLUCOSE 83 10/14/2017   CHOL 241 (H) 11/12/2013   TRIG 150.0 (H) 11/12/2013   HDL 39.90 11/12/2013   LDLDIRECT 176.7 11/12/2013   ALT 19 11/17/2017   AST 21 11/17/2017   NA 138 10/14/2017   K 4.4 10/14/2017   CL 103 10/14/2017   CREATININE 0.92 10/14/2017   BUN 12 10/14/2017   CO2 29 10/14/2017   TSH 0.57 09/22/2014   PSA 1.61 11/17/2017   INR 1.0 06/14/2009    Dg Cervical Spine Complete  Result Date: 04/02/2018 CLINICAL DATA:  BILATERAL shoulder and neck pain for 2 months. EXAM: CERVICAL SPINE - COMPLETE 4+ VIEW COMPARISON:  08/12/2014 MRI FINDINGS: Prior C5-6 ACDF. Solid arthrodesis. Anatomic alignment. Mild spurring C6-C7. No foraminal narrowing. Thyroidectomy clips. Lung apices clear. Negative odontoid. IMPRESSION: Unremarkable  appearance status post C5-6 ACDF. Electronically Signed   By: Staci Righter M.D.   On: 04/02/2018 14:37    Assessment & Plan:   There are no diagnoses linked to this encounter.   No orders of the defined types were placed in this encounter.    Follow-up: No follow-ups on file.  Walker Kehr, MD

## 2018-11-09 NOTE — Assessment & Plan Note (Signed)
discussed cardiac CT scan for calcium scoring Labs

## 2018-11-09 NOTE — Assessment & Plan Note (Signed)
Appt w/Dr Redmond Baseman OTC meds

## 2018-11-09 NOTE — Assessment & Plan Note (Signed)
Dr Creig Hines Fluoxetine, Klonopin, Trazodone

## 2018-11-09 NOTE — Patient Instructions (Addendum)
Cardiac CT calcium scoring test $150   Computed tomography, more commonly known as a CT or CAT scan, is a diagnostic medical imaging test. Like traditional x-rays, it produces multiple images or pictures of the inside of the body. The cross-sectional images generated during a CT scan can be reformatted in multiple planes. They can even generate three-dimensional images. These images can be viewed on a computer monitor, printed on film or by a 3D printer, or transferred to a CD or DVD. CT images of internal organs, bones, soft tissue and blood vessels provide greater detail than traditional x-rays, particularly of soft tissues and blood vessels. A cardiac CT scan for coronary calcium is a non-invasive way of obtaining information about the presence, location and extent of calcified plaque in the coronary arteries-the vessels that supply oxygen-containing blood to the heart muscle. Calcified plaque results when there is a build-up of fat and other substances under the inner layer of the artery. This material can calcify which signals the presence of atherosclerosis, a disease of the vessel wall, also called coronary artery disease (CAD). People with this disease have an increased risk for heart attacks. In addition, over time, progression of plaque build up (CAD) can narrow the arteries or even close off blood flow to the heart. The result may be chest pain, sometimes called "angina," or a heart attack. Because calcium is a marker of CAD, the amount of calcium detected on a cardiac CT scan is a helpful prognostic tool. The findings on cardiac CT are expressed as a calcium score. Another name for this test is coronary artery calcium scoring.  What are some common uses of the procedure? The goal of cardiac CT scan for calcium scoring is to determine if CAD is present and to what extent, even if there are no symptoms. It is a screening study that may be recommended by a physician for patients with risk factors  for CAD but no clinical symptoms. The major risk factors for CAD are: . high blood cholesterol levels  . family history of heart attacks  . diabetes  . high blood pressure  . cigarette smoking  . overweight or obese  . physical inactivity   A negative cardiac CT scan for calcium scoring shows no calcification within the coronary arteries. This suggests that CAD is absent or so minimal it cannot be seen by this technique. The chance of having a heart attack over the next two to five years is very low under these circumstances. A positive test means that CAD is present, regardless of whether or not the patient is experiencing any symptoms. The amount of calcification-expressed as the calcium score-may help to predict the likelihood of a myocardial infarction (heart attack) in the coming years and helps your medical doctor or cardiologist decide whether the patient may need to take preventive medicine or undertake other measures such as diet and exercise to lower the risk for heart attack. The extent of CAD is graded according to your calcium score:  Calcium Score  Presence of CAD  0 No evidence of CAD   1-10 Minimal evidence of CAD  11-100 Mild evidence of CAD  101-400 Moderate evidence of CAD  Over 400 Extensive evidence of CAD     You can use over-the-counter  "cold" medicines  such as  "Afrin" nasal spray for nasal congestion as directed. Use " Delsym" or" Robitussin" cough syrup varietis for cough.  Use Halls or Ricola cough drops.   Saw Palmetto for prostate

## 2018-11-09 NOTE — Assessment & Plan Note (Signed)
Fluoxetine, Klonopin, Trazodone

## 2018-11-09 NOTE — Assessment & Plan Note (Signed)
PSA Saw Palmetto

## 2018-11-09 NOTE — Addendum Note (Signed)
Addended by: Karren Cobble on: 11/09/2018 11:52 AM   Modules accepted: Orders

## 2018-11-16 ENCOUNTER — Other Ambulatory Visit (INDEPENDENT_AMBULATORY_CARE_PROVIDER_SITE_OTHER): Payer: BLUE CROSS/BLUE SHIELD

## 2018-11-16 DIAGNOSIS — Z0000 Encounter for general adult medical examination without abnormal findings: Secondary | ICD-10-CM

## 2018-11-16 DIAGNOSIS — R5383 Other fatigue: Secondary | ICD-10-CM | POA: Diagnosis not present

## 2018-11-16 LAB — CBC WITH DIFFERENTIAL/PLATELET
BASOS ABS: 0.1 10*3/uL (ref 0.0–0.1)
BASOS PCT: 0.9 % (ref 0.0–3.0)
EOS PCT: 2.4 % (ref 0.0–5.0)
Eosinophils Absolute: 0.1 10*3/uL (ref 0.0–0.7)
HEMATOCRIT: 43.9 % (ref 39.0–52.0)
Hemoglobin: 14.4 g/dL (ref 13.0–17.0)
LYMPHS PCT: 42.1 % (ref 12.0–46.0)
Lymphs Abs: 2.5 10*3/uL (ref 0.7–4.0)
MCHC: 32.8 g/dL (ref 30.0–36.0)
MCV: 89.3 fl (ref 78.0–100.0)
MONOS PCT: 9.9 % (ref 3.0–12.0)
Monocytes Absolute: 0.6 10*3/uL (ref 0.1–1.0)
NEUTROS ABS: 2.6 10*3/uL (ref 1.4–7.7)
Neutrophils Relative %: 44.7 % (ref 43.0–77.0)
Platelets: 162 10*3/uL (ref 150.0–400.0)
RBC: 4.91 Mil/uL (ref 4.22–5.81)
RDW: 14 % (ref 11.5–15.5)
WBC: 5.8 10*3/uL (ref 4.0–10.5)

## 2018-11-16 LAB — HEPATIC FUNCTION PANEL
ALT: 26 U/L (ref 0–53)
AST: 20 U/L (ref 0–37)
Albumin: 4.3 g/dL (ref 3.5–5.2)
Alkaline Phosphatase: 101 U/L (ref 39–117)
BILIRUBIN TOTAL: 0.7 mg/dL (ref 0.2–1.2)
Bilirubin, Direct: 0.1 mg/dL (ref 0.0–0.3)
Total Protein: 7 g/dL (ref 6.0–8.3)

## 2018-11-16 LAB — URINALYSIS
Bilirubin Urine: NEGATIVE
Hgb urine dipstick: NEGATIVE
Ketones, ur: NEGATIVE
LEUKOCYTES UA: NEGATIVE
Nitrite: NEGATIVE
Specific Gravity, Urine: 1.01 (ref 1.000–1.030)
Total Protein, Urine: NEGATIVE
UROBILINOGEN UA: 0.2 (ref 0.0–1.0)
Urine Glucose: NEGATIVE
pH: 8 (ref 5.0–8.0)

## 2018-11-16 LAB — LIPID PANEL
CHOLESTEROL: 249 mg/dL — AB (ref 0–200)
HDL: 44.4 mg/dL (ref >39.00)
LDL Cholesterol: 183 mg/dL — ABNORMAL HIGH (ref 0–99)
NonHDL: 204.38
TRIGLYCERIDES: 108 mg/dL (ref 0.0–149.0)
Total CHOL/HDL Ratio: 6
VLDL: 21.6 mg/dL (ref 0.0–40.0)

## 2018-11-16 LAB — BASIC METABOLIC PANEL
BUN: 9 mg/dL (ref 6–23)
CALCIUM: 9.2 mg/dL (ref 8.4–10.5)
CO2: 27 meq/L (ref 19–32)
Chloride: 104 mEq/L (ref 96–112)
Creatinine, Ser: 0.96 mg/dL (ref 0.40–1.50)
GFR: 106.11 mL/min (ref >60.00)
Glucose, Bld: 91 mg/dL (ref 70–99)
POTASSIUM: 4.4 meq/L (ref 3.5–5.1)
SODIUM: 138 meq/L (ref 135–145)

## 2018-11-16 LAB — TSH: TSH: 0.23 u[IU]/mL — ABNORMAL LOW (ref 0.35–4.50)

## 2018-11-16 LAB — TESTOSTERONE: TESTOSTERONE: 420.29 ng/dL (ref 300.00–890.00)

## 2018-11-16 LAB — PSA: PSA: 0.82 ng/mL (ref 0.10–4.00)

## 2018-11-23 DIAGNOSIS — J381 Polyp of vocal cord and larynx: Secondary | ICD-10-CM | POA: Insufficient documentation

## 2018-11-23 DIAGNOSIS — R682 Dry mouth, unspecified: Secondary | ICD-10-CM | POA: Insufficient documentation

## 2018-11-23 DIAGNOSIS — R490 Dysphonia: Secondary | ICD-10-CM | POA: Insufficient documentation

## 2018-11-24 ENCOUNTER — Ambulatory Visit: Payer: BLUE CROSS/BLUE SHIELD | Admitting: Internal Medicine

## 2018-12-28 ENCOUNTER — Other Ambulatory Visit (INDEPENDENT_AMBULATORY_CARE_PROVIDER_SITE_OTHER): Payer: Self-pay | Admitting: Orthopaedic Surgery

## 2018-12-28 NOTE — Telephone Encounter (Signed)
Ok to refill 

## 2019-03-20 ENCOUNTER — Other Ambulatory Visit: Payer: Self-pay | Admitting: Psychiatry

## 2019-03-20 DIAGNOSIS — F411 Generalized anxiety disorder: Secondary | ICD-10-CM

## 2019-03-22 NOTE — Telephone Encounter (Signed)
Last appointment 11/04/2018 reporting less use of Klonopin attempting other substitutes for his generalized anxiety without success.  The 180 count escription of 90 days for Klonopin of 11/04/2018 was apparently filled 12/07/2018 now exhausted after 3-1/2 months due for refill in this coronavirus pandemic sending #180 of the 1 mg tablets twice daily as [facility] [address].

## 2019-03-22 NOTE — Telephone Encounter (Signed)
Last fill 12/07/2018 No pending appts

## 2019-03-30 ENCOUNTER — Telehealth (INDEPENDENT_AMBULATORY_CARE_PROVIDER_SITE_OTHER): Payer: Self-pay | Admitting: Orthopaedic Surgery

## 2019-03-30 NOTE — Telephone Encounter (Signed)
Patient called stating that he has had neck pain along with numbness into his left shoulder and up underneath the left shoulder blade.  Patient is wanting to know if he could get a cortisone injection in his neck.  CB#[phone].  Thank you.

## 2019-03-30 NOTE — Telephone Encounter (Signed)
I called voicemail message left

## 2019-03-30 NOTE — Telephone Encounter (Signed)
Please advise 

## 2019-03-31 ENCOUNTER — Other Ambulatory Visit (INDEPENDENT_AMBULATORY_CARE_PROVIDER_SITE_OTHER): Payer: Self-pay

## 2019-03-31 MED ORDER — PREDNISONE 10 MG (21) PO TBPK: ORAL_TABLET | ORAL | 0 refills | Status: DC

## 2019-03-31 NOTE — Telephone Encounter (Signed)
Talked with patient and advised him that a Rx for Prednisone 10mg  dose pack was [facility] [address].

## 2019-03-31 NOTE — Telephone Encounter (Signed)
Send in prednisone 10mg  dose pack . ROV in one month.   [facility] [address]

## 2019-03-31 NOTE — Telephone Encounter (Signed)
Thanks

## 2019-07-22 ENCOUNTER — Other Ambulatory Visit: Payer: Self-pay

## 2019-07-22 ENCOUNTER — Other Ambulatory Visit: Payer: Self-pay | Admitting: Psychiatry

## 2019-07-22 ENCOUNTER — Ambulatory Visit (INDEPENDENT_AMBULATORY_CARE_PROVIDER_SITE_OTHER): Payer: BC Managed Care – PPO | Admitting: Internal Medicine

## 2019-07-22 ENCOUNTER — Other Ambulatory Visit (INDEPENDENT_AMBULATORY_CARE_PROVIDER_SITE_OTHER): Payer: BC Managed Care – PPO

## 2019-07-22 DIAGNOSIS — R062 Wheezing: Secondary | ICD-10-CM | POA: Diagnosis not present

## 2019-07-22 DIAGNOSIS — F411 Generalized anxiety disorder: Secondary | ICD-10-CM

## 2019-07-22 DIAGNOSIS — R1013 Epigastric pain: Secondary | ICD-10-CM | POA: Diagnosis not present

## 2019-07-22 DIAGNOSIS — J309 Allergic rhinitis, unspecified: Secondary | ICD-10-CM | POA: Diagnosis not present

## 2019-07-22 DIAGNOSIS — F419 Anxiety disorder, unspecified: Secondary | ICD-10-CM | POA: Diagnosis not present

## 2019-07-22 LAB — URINALYSIS, ROUTINE W REFLEX MICROSCOPIC
Bilirubin Urine: NEGATIVE
Hgb urine dipstick: NEGATIVE
Ketones, ur: NEGATIVE
Leukocytes,Ua: NEGATIVE
Nitrite: NEGATIVE
Specific Gravity, Urine: 1.02 (ref 1.000–1.030)
Total Protein, Urine: NEGATIVE
Urine Glucose: NEGATIVE
Urobilinogen, UA: 0.2 (ref 0.0–1.0)
pH: 6.5 (ref 5.0–8.0)

## 2019-07-22 LAB — BASIC METABOLIC PANEL
BUN: 14 mg/dL (ref 6–23)
CO2: 25 mEq/L (ref 19–32)
Calcium: 9.2 mg/dL (ref 8.4–10.5)
Chloride: 105 mEq/L (ref 96–112)
Creatinine, Ser: 0.98 mg/dL (ref 0.40–1.50)
GFR: 97.23 mL/min (ref >60.00)
Glucose, Bld: 100 mg/dL — ABNORMAL HIGH (ref 70–99)
Potassium: 4.4 mEq/L (ref 3.5–5.1)
Sodium: 135 mEq/L (ref 135–145)

## 2019-07-22 LAB — CBC WITH DIFFERENTIAL/PLATELET
Basophils Absolute: 0.1 10*3/uL (ref 0.0–0.1)
Basophils Relative: 0.9 % (ref 0.0–3.0)
Eosinophils Absolute: 0.2 10*3/uL (ref 0.0–0.7)
Eosinophils Relative: 2.9 % (ref 0.0–5.0)
HCT: 44 % (ref 39.0–52.0)
Hemoglobin: 14.7 g/dL (ref 13.0–17.0)
Lymphocytes Relative: 40.6 % (ref 12.0–46.0)
Lymphs Abs: 2.7 10*3/uL (ref 0.7–4.0)
MCHC: 33.4 g/dL (ref 30.0–36.0)
MCV: 89 fl (ref 78.0–100.0)
Monocytes Absolute: 0.9 10*3/uL (ref 0.1–1.0)
Monocytes Relative: 13.8 % — ABNORMAL HIGH (ref 3.0–12.0)
Neutro Abs: 2.8 10*3/uL (ref 1.4–7.7)
Neutrophils Relative %: 41.8 % — ABNORMAL LOW (ref 43.0–77.0)
Platelets: 147 10*3/uL — ABNORMAL LOW (ref 150.0–400.0)
RBC: 4.95 Mil/uL (ref 4.22–5.81)
RDW: 13.8 % (ref 11.5–15.5)
WBC: 6.6 10*3/uL (ref 4.0–10.5)

## 2019-07-22 LAB — HEPATIC FUNCTION PANEL
ALT: 21 U/L (ref 0–53)
AST: 17 U/L (ref 0–37)
Albumin: 4.4 g/dL (ref 3.5–5.2)
Alkaline Phosphatase: 97 U/L (ref 39–117)
Bilirubin, Direct: 0.1 mg/dL (ref 0.0–0.3)
Total Bilirubin: 0.4 mg/dL (ref 0.2–1.2)
Total Protein: 7.5 g/dL (ref 6.0–8.3)

## 2019-07-22 LAB — LIPASE: Lipase: 69 U/L — ABNORMAL HIGH (ref 11.0–59.0)

## 2019-07-22 LAB — H. PYLORI ANTIBODY, IGG: H Pylori IgG: NEGATIVE

## 2019-07-22 MED ORDER — ALBUTEROL SULFATE HFA 108 (90 BASE) MCG/ACT IN AERS: 2.0000 | INHALATION_SPRAY | Freq: Four times a day (QID) | RESPIRATORY_TRACT | 5 refills | Status: DC | PRN

## 2019-07-22 MED ORDER — FEXOFENADINE HCL 180 MG PO TABS: 180.0000 mg | ORAL_TABLET | Freq: Every day | ORAL | 3 refills | Status: DC

## 2019-07-22 MED ORDER — TRIAMCINOLONE ACETONIDE 55 MCG/ACT NA AERO: 2.0000 | INHALATION_SPRAY | Freq: Every day | NASAL | 12 refills | Status: DC

## 2019-07-22 MED ORDER — ONDANSETRON HCL 4 MG PO TABS: 4.0000 mg | ORAL_TABLET | Freq: Three times a day (TID) | ORAL | 1 refills | Status: DC | PRN

## 2019-07-22 MED ORDER — PANTOPRAZOLE SODIUM 40 MG PO TBEC: 40.0000 mg | DELAYED_RELEASE_TABLET | Freq: Every day | ORAL | 1 refills | Status: DC

## 2019-07-22 NOTE — Patient Instructions (Signed)
Please take all new medication as prescribed - the protonix, zofran, allegra and nasacort, and albuterol inhaler as needed  OK to take a probiotic such as Align  Please continue all other medications as before, and refills have been done if requested.  Please have the pharmacy call with any other refills you may need.  Please keep your appointments with your specialists as you may have planned  Please go to the LAB in the Basement (turn left off the elevator) for the tests to be done today  You will be contacted by phone if any changes need to be made immediately.  Otherwise, you will receive a letter about your results with an explanation, but please check with MyChart first.  Please remember to sign up for MyChart if you have not done so, as this will be important to you in the future with finding out test results, communicating by private email, and scheduling acute appointments online when needed.

## 2019-07-22 NOTE — Progress Notes (Deleted)
   Subjective:    Patient ID: Elijah Ashley, male    DOB: August 25, 1966, 53 y.o.   MRN: [id number]  HPI    Review of Systems     Objective:   Physical Exam        Assessment & Plan:

## 2019-07-23 NOTE — Telephone Encounter (Signed)
Appointment 11/14/2019 being no-show for scheduled follow-up at 6 months having filled last Klonopin in April now asking for refill without appointment only able to provide emergency supply until next appointment sending #30 with [facility] [address] use 1 twice daily as needed for anxiety.

## 2019-07-23 NOTE — Telephone Encounter (Signed)
Last appt 10/2018 nothing scheduled

## 2019-07-25 ENCOUNTER — Encounter: Payer: Self-pay | Admitting: Internal Medicine

## 2019-07-25 NOTE — Assessment & Plan Note (Signed)
To add nasacort asd,  to f/u any worsening symptoms or concerns 

## 2019-07-25 NOTE — Assessment & Plan Note (Addendum)
Mild, etiology unclear, differential includes gastritis vs reflux vs other, for protonix 40 qd, probiotic asd, zofran prn, labs as ordered,  to f/u any worsening symptoms or concerns  Note:  Total time for pt hx, exam, review of record with pt in the room, determination of diagnoses and plan for further eval and tx is > 40 min, with over 50% spent in coordination and counseling of patient including the differential dx, tx, further evaluation and other management of epigastric pain, allergic rhinitis, wheezing, anxiety

## 2019-07-25 NOTE — Progress Notes (Addendum)
Patient ID: Elijah Ashley, male   DOB: [birth year], 53 y.o.   MRN: [id number]  Virtual Visit via Video Note  I connected with Elijah Ashley on Jul 22, 2019 at  2:20 PM EDT by a video enabled telemedicine application and verified that I am speaking with the correct person using two identifiers.  Location: Patient: at home Provider: at office   I discussed the limitations of evaluation and management by telemedicine and the availability of in person appointments. The patient expressed understanding and agreed to proceed.  History of Present Illness: Here to report GI symptoms; seemed to begin shortly after he and wife eating takeout from restaurant sat evening (5 days ago), then he and wife had onset next day of epigastric pains spasm like mild intermittent, mylanta helped as well as bland diet; has mild nausea but no vomiting, + mild dizzy as well that seemed worse with being outside in the heat and mild occasional HA and chills.  No fever, ST, cough and Pt denies chest pain, increased sob or doe, wheezing, orthopnea, PND, increased LE swelling, palpitations, dizziness or syncope.  Since then has recurring mild epigastric pain, increased passing gas and belching but has regular normal appearing BMs, no diarrhea or blood.  Denies  Dysphagia.  Appetite ok, no recent wt loss.  Also, Does have several wks ongoing nasal allergy symptoms with clearish congestion, itch and sneezing, without fever, pain, ST, cough, swelling but does have occasoinal mild wheezing, sob   Denies worsening depressive symptoms, suicidal ideation, or panic; has ongoing anxiety Past Medical History:  Diagnosis Date  . Allergy   . Anxiety   . Depression   . GERD (gastroesophageal reflux disease)    past hx- some now that comes and goes  . Glaucoma (increased eye pressure)   . Hemorrhoids    pt states he gets regular flares with these   . Hx of adenomatous polyp of colon 10/02/2017  . Hyperlipidemia    slightly  elevated- working on diet and exercise, no meds   . HYPOTHYROIDISM    postsurgical  . LOW BACK PAIN    L4-5 degen disc on MRI - s/p ESI 09/2010  . 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 Psoriasis   . URINARY RETENTION    Past Surgical History:  Procedure Laterality Date  . CERVICAL LAMINECTOMY  07/2007   C5-6 ACDF due to myelopathy , C3-6 per report   . TOTAL THYROIDECTOMY  03/2009   graves and cold nodule (papillary ca)    reports that he has never smoked. He has never used smokeless tobacco. He reports current alcohol use. He reports that he does not use drugs. family history includes Breast cancer in an other family member; Cervical cancer in an other family member; Colon cancer in an other family member; Colon cancer (age of onset: 56) in his brother; Colon cancer (age of onset: 36) in his mother; Diabetes in his father; Hypertension in an other family member; Pancreatic cancer (age of onset: 31) in his sister; Ulcerative colitis in his sister. No Known Allergies Current Outpatient Medications on File Prior to Visit  Medication Sig Dispense Refill  . buPROPion (WELLBUTRIN XL) 150 MG 24 hr tablet Take 1 tablet (150 mg total) by mouth daily. 90 tablet 1  . Cholecalciferol (VITAMIN D3) 2000 units capsule Take 1 capsule (2,000 Units total) by mouth daily. 100 capsule 3  . cyclobenzaprine (FLEXERIL) 10 MG  tablet TAKE 1 TABLET BY MOUTH EVERYDAY AT BEDTIME 30 tablet 0  . dorzolamide-timolol (COSOPT) 22.3-6.8 MG/ML ophthalmic solution Place 1 drop into both eyes 2 (two) times daily.      Marland Kitchen FLUoxetine (PROZAC) 40 MG capsule Take 1 capsule (40 mg total) by mouth daily. 90 capsule 1  . Glucosamine-Chondroit-Vit C-Mn (GLUCOSAMINE 1500 COMPLEX PO) Take by mouth as needed.    . latanoprost (XALATAN) 0.005 % ophthalmic solution Place 1 drop into both eyes at bedtime. 2.5 mL 1  . levothyroxine (SYNTHROID, LEVOTHROID) 175 MCG tablet 1  TABLET EVERY MORNING ON AN EMPTY STOMACH ONCE A DAY EVERY DAY ORALLY 90 DAYS  3  . levothyroxine (SYNTHROID, LEVOTHROID) 200 MCG tablet Take 175 mcg by mouth daily.     . mupirocin ointment (BACTROBAN) 2 % Use qid R nostril 15 g 0  . Omega-3 Fatty Acids (FISH OIL) 1000 MG CAPS Take by mouth daily.    Marland Kitchen OVER THE COUNTER MEDICATION Take 1 capsule by mouth daily. Ginger and tumeric daily    . predniSONE (STERAPRED UNI-PAK 21 TAB) 10 MG (21) TBPK tablet Take as directed. 21 tablet 0   Current Facility-Administered Medications on File Prior to Visit  Medication Dose Route Frequency Provider Last Rate Last Dose  . 0.9 %  sodium chloride infusion  500 mL Intravenous Continuous Gatha Mayer, MD       Observations/Objective: Alert, NAD, 2+ nervous mood and affect, resps normal, cn 2-12 intact, moves all 4s, no visible rash or swelling Lab Results  Component Value Date   WBC 6.6 07/22/2019   HGB 14.7 07/22/2019   HCT 44.0 07/22/2019   PLT 147.0 (L) 07/22/2019   GLUCOSE 100 (H) 07/22/2019   CHOL 249 (H) 11/16/2018   TRIG 108.0 11/16/2018   HDL 44.40 11/16/2018   LDLDIRECT 176.7 11/12/2013   LDLCALC 183 (H) 11/16/2018   ALT 21 07/22/2019   AST 17 07/22/2019   NA 135 07/22/2019   K 4.4 07/22/2019   CL 105 07/22/2019   CREATININE 0.98 07/22/2019   BUN 14 07/22/2019   CO2 25 07/22/2019   TSH 0.23 (L) 11/16/2018   PSA 0.82 11/16/2018   INR 1.0 06/14/2009   Assessment and Plan: See notes  Follow Up Instructions: See notes   I discussed the assessment and treatment plan with the patient. The patient was provided an opportunity to ask questions and all were answered. The patient agreed with the plan and demonstrated an understanding of the instructions.   The patient was advised to call back or seek an in-person evaluation if the symptoms worsen or if the condition fails to improve as anticipated.   Cathlean Cower, MD

## 2019-07-25 NOTE — Assessment & Plan Note (Signed)
Suspect mild underlying intermittent asthma - for trial albuterol hfa prn,  to f/u any worsening symptoms or concerns

## 2019-08-12 ENCOUNTER — Other Ambulatory Visit (INDEPENDENT_AMBULATORY_CARE_PROVIDER_SITE_OTHER): Payer: Self-pay | Admitting: Orthopaedic Surgery

## 2019-08-12 ENCOUNTER — Other Ambulatory Visit: Payer: Self-pay | Admitting: Psychiatry

## 2019-08-12 DIAGNOSIS — F411 Generalized anxiety disorder: Secondary | ICD-10-CM

## 2019-08-12 DIAGNOSIS — F3342 Major depressive disorder, recurrent, in full remission: Secondary | ICD-10-CM

## 2019-08-12 NOTE — Telephone Encounter (Signed)
Has follow up 08/16/2019

## 2019-08-12 NOTE — Telephone Encounter (Signed)
Last appointment 11/04/2018 now 3 months over the mandated 32-month appointment required for controlled substance Klonopin sending second emergency supply now 20 tablets instead of 30 as on 07/23/2019 per [place] registry as he did schedule office appointment for 08/16/2019 but must keep that with no other contraindication [facility] [place] with a 90-day supply of Prozac 40 mg.

## 2019-08-12 NOTE — Telephone Encounter (Signed)
Please advise 

## 2019-08-13 NOTE — Telephone Encounter (Signed)
Can't tell, did pt request this? If so OK refill and schedule ROV , has not been seen in one year. thanks

## 2019-08-13 NOTE — Telephone Encounter (Signed)
Patient has appointment on 08/18/2019.

## 2019-08-16 ENCOUNTER — Ambulatory Visit: Payer: BLUE CROSS/BLUE SHIELD | Admitting: Psychiatry

## 2019-08-16 ENCOUNTER — Ambulatory Visit (INDEPENDENT_AMBULATORY_CARE_PROVIDER_SITE_OTHER): Payer: BC Managed Care – PPO | Admitting: Psychiatry

## 2019-08-16 ENCOUNTER — Encounter: Payer: Self-pay | Admitting: Psychiatry

## 2019-08-16 ENCOUNTER — Ambulatory Visit: Payer: BC Managed Care – PPO | Admitting: Psychiatry

## 2019-08-16 ENCOUNTER — Other Ambulatory Visit: Payer: Self-pay

## 2019-08-16 VITALS — Ht 77.0 in | Wt 250.0 lb

## 2019-08-16 DIAGNOSIS — F411 Generalized anxiety disorder: Secondary | ICD-10-CM

## 2019-08-16 DIAGNOSIS — F3342 Major depressive disorder, recurrent, in full remission: Secondary | ICD-10-CM | POA: Diagnosis not present

## 2019-08-16 MED ORDER — CLONAZEPAM 1 MG PO TABS: 1.0000 mg | ORAL_TABLET | Freq: Two times a day (BID) | ORAL | 1 refills | Status: DC | PRN

## 2019-08-16 MED ORDER — FLUOXETINE HCL 40 MG PO CAPS: ORAL_CAPSULE | ORAL | 1 refills | Status: DC

## 2019-08-16 NOTE — Progress Notes (Signed)
[facility] Check  Patient ID: Elijah Ashley,  MRN: [id number]  PCP: Elijah Anger, MD  Date of Evaluation: 08/16/2019 Time spent:20 minutes from 1000 to 1020  Chief Complaint:  Chief Complaint    Anxiety; Depression      HISTORY/CURRENT STATUS: [name] is seen onsite in office face-to-face individually with consent with epic collateral for psychiatric interview and exam in 48-month evaluation and management of generalized anxiety and partially treated major depression.  The patient seems likely to have delayed return for 25-month follow-up as he was doing better, though he does not acknowledge such directly in review and discussion today.  In fact he suggests he has more anxiety recently with COVID virus, [place] work, Dr. Lorin Mercy orthopedic recommending surgical care for a cervical disc, and mother's death in [place] in 04/05/23 having dementia unable to travel there for the funeral due to the COVID virus.  He thinks he has delayed grieving and that it may occur at any time though he describes gradual stepwise resolving of loss.  He worries about his wife's son not seeing therapist here as frequently, now planning himself to encourage the therapy with Lina Sayre to continue.  His weight is up 7 pounds.  His wife has significant COVID diathesis having asthma, chemo, previous collapsed lung and need for his support.  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  He is taking more clonazepam for his anxiety but states he is cutting them in half often so that current supply is probably adequate and better than increasing the amount.  I cannot determine that the Klonopin prolongs reaction time or slows reflexes particularly for his driving.  However cluster C regression seems evident with death of mother as he now faces  several medical concerns himself.  He has no mania, suicidality, psychosis, substance use, or delirium.  Depression        The patient presents with recurrent depression current episode remitting more than 1 year ago.   The onset quality was sudden.   The symptoms are intermittent and the most recent episode lasted 6 weeks. The problem has been waxing and waning since onset.  Associated symptoms include decreased concentration, restlessness, body aches and indigestion.  Associated symptoms include does not have insomnia,  helplessness, appetite change, and suicidal ideas.     The symptoms are aggravated by work stress, medication, social issues and family issues.  Past treatments include SSRIs - Selective serotonin reuptake inhibitors and other medications.  Compliance with treatment is variable.  Past compliance problems include difficulty understanding directions, difficulty with treatment plan, medication issues and medical issues.  Previous treatment provided moderate relief.  Risk factors include family history, a change in medication usage/dosage, history of mental illness, major life event and stress.   Past medical history includes anxiety, depression and mental health disorder.     Pertinent negatives include no chronic fatigue syndrome, no hypothyroidism, no chronic illness, no life-threatening condition, no physical disability, no recent psychiatric admission, no brain trauma, no obsessive-compulsive disorder, no post-traumatic stress disorder, no schizophrenia, no suicide attempts and no head trauma.  Individual Medical History/ Review of Systems: Changes? :Yes Weight up 7 pounds and having orthopedic care for cervical disc protrusion surgery being considered.  Allergies: Patient has no known allergies.  Current Medications:  Current Outpatient Medications:  .  albuterol (VENTOLIN HFA) 108 (90 Base) MCG/ACT inhaler, Inhale 2 puffs into the lungs every 6 (six) hours as needed for wheezing or  shortness of breath., Disp: 18 g, Rfl: 5 .  Cholecalciferol (VITAMIN D3) 2000 units capsule, Take 1 capsule (2,000 Units total) by mouth daily., Disp: 100 capsule, Rfl: 3 .  clonazePAM (KLONOPIN) 1 MG tablet, Take 1 tablet (1 mg total) by mouth 2 (two) times daily as needed for anxiety., Disp: 180 tablet, Rfl: 1 .  cyclobenzaprine (FLEXERIL) 10 MG tablet, TAKE 1 TABLET BY MOUTH EVERYDAY AT BEDTIME, Disp: 30 tablet, Rfl: 0 .  dorzolamide-timolol (COSOPT) 22.3-6.8 MG/ML ophthalmic solution, Place 1 drop into both eyes 2 (two) times daily.  , Disp: , Rfl:  .  fexofenadine (ALLEGRA) 180 MG tablet, Take 1 tablet (180 mg total) by mouth daily., Disp: 90 tablet, Rfl: 3 .  FLUoxetine (PROZAC) 40 MG capsule, Take 1 capsule by mouth total 40 mg daily after work, Disp: 90 capsule, Rfl: 1 .  Glucosamine-Chondroit-Vit C-Mn (GLUCOSAMINE 1500 COMPLEX PO), Take by mouth as needed., Disp: , Rfl:  .  latanoprost (XALATAN) 0.005 % ophthalmic solution, Place 1 drop into both eyes at bedtime., Disp: 2.5 mL, Rfl: 1 .  levothyroxine (SYNTHROID, LEVOTHROID) 175 MCG tablet, 1 TABLET EVERY MORNING ON AN EMPTY STOMACH ONCE A DAY EVERY DAY ORALLY 90 DAYS, Disp: , Rfl: 3 .  levothyroxine (SYNTHROID, LEVOTHROID) 200 MCG tablet, Take 175 mcg by mouth daily. , Disp: , Rfl:  .  mupirocin ointment (BACTROBAN) 2 %, Use qid R nostril, Disp: 15 g, Rfl: 0 .  Omega-3 Fatty Acids (FISH OIL) 1000 MG CAPS, Take by mouth daily., Disp: , Rfl:  .  ondansetron (ZOFRAN) 4 MG tablet, Take 1 tablet (4 mg total) by mouth every 8 (eight) hours as needed for nausea or vomiting., Disp: 20 tablet, Rfl: 1 .  OVER THE COUNTER MEDICATION, Take 1 capsule by mouth daily. Ginger and tumeric daily, Disp: , Rfl:  .  pantoprazole (PROTONIX) 40 MG tablet, Take 1 tablet (40 mg total) by mouth daily., Disp: 90 tablet, Rfl: 1 .  predniSONE (STERAPRED UNI-PAK 21 TAB) 10 MG (21) TBPK tablet, Take as directed., Disp: 21 tablet, Rfl: 0 .  triamcinolone (NASACORT) 55  MCG/ACT AERO nasal inhaler, Place 2 sprays into the nose daily., Disp: 1 Inhaler, Rfl: 12  Current Facility-Administered Medications:  .  0.9 %  sodium chloride infusion, 500 mL, Intravenous, Continuous, Carlean Purl Ofilia Neas, MD   Medication Side Effects: none  Family Medical/ Social History: Changes? Yes with wife being vulnerable to COVID so that patient is very protective of her, and son is again somewhat regressed simultaneous with patient showing cluster C regression around the death of his mother.  MENTAL HEALTH EXAM:  Height 6\' 5"  (1.956 m), weight 250 lb (113.4 kg).Body mass index is 29.65 kg/m.  Others deferred for coronavirus pandemic. Muscle strengths and tone 5/5, postural reflexes and gait 0/0, and AIMS = 0.  General Appearance: Casual, Fairly Groomed and Guarded  Eye Contact:  Fair  Speech:  Clear and Coherent, Slow and Talkative  Volume:  Normal  Mood:  Anxious, Depressed, Dysphoric and Worthless  Affect:  Inappropriate, Labile, Full Range and Anxious  Thought Process:  Coherent, Irrelevant, Linear and Descriptions of Associations: Loose  Orientation:  Full (Time, Place, and Person)  Thought Content: Obsessions and Rumination   Suicidal Thoughts:  No  Homicidal Thoughts:  No  Memory:  Immediate;   Good Remote;   Good  Judgement:  Fair  Insight:  Fair  Psychomotor Activity:  Normal, Decreased, Mannerisms and Restlessness  Concentration:  Concentration: Fair and Attention Span: Fair  Recall:  Good  Fund of Knowledge: Good  Language: Fair  Assets:  Desire for Improvement Leisure Time Talents/Skills  ADL's:  Intact  Cognition: WNL  Prognosis:  Fair    DIAGNOSES:    ICD-10-CM   1. Generalized anxiety disorder  F41.1 FLUoxetine (PROZAC) 40 MG capsule    clonazePAM (KLONOPIN) 1 MG tablet  2. Major depressive disorder, recurrent episode, in full remission ([place])  F33.42 FLUoxetine (PROZAC) 40 MG capsule    Receiving Psychotherapy: No    RECOMMENDATIONS: Over 50% of  the time is spent in counseling and coordination of care combining CBT for grief and loss, behavioral nutrition, sleep hygiene, and frustration tolerance management and integration with maintaining constant dose of fluoxetine and clonazepam being off the bupropion for headache since the end of 2019.  Fluoxetine is E scribed 40 mg daily after his work shift for sleep #90 with 1 [facility] [address] for generalized anxiety and major depression.  His anxiety requires Klonopin 1 mg twice daily as needed also sent as a 90-day supply of #180 tablets and [facility] [address] with psychoeducation on warnings and risk for prevention and monitoring and safety hygiene.  He returns in 6 months or sooner if needed also discussing psychotherapy is available for him including grief and loss in the office and relative to wife's chronic illness.   Delight Hoh, MD

## 2019-08-18 ENCOUNTER — Ambulatory Visit (INDEPENDENT_AMBULATORY_CARE_PROVIDER_SITE_OTHER): Payer: BC Managed Care – PPO | Admitting: Orthopaedic Surgery

## 2019-08-18 ENCOUNTER — Other Ambulatory Visit: Payer: Self-pay

## 2019-08-18 ENCOUNTER — Encounter: Payer: Self-pay | Admitting: Orthopaedic Surgery

## 2019-08-18 VITALS — BP 108/68 | HR 63 | Ht 77.0 in | Wt 250.0 lb

## 2019-08-18 DIAGNOSIS — M5020 Other cervical disc displacement, unspecified cervical region: Secondary | ICD-10-CM | POA: Diagnosis not present

## 2019-08-18 MED ORDER — PREDNISONE 10 MG PO TABS: ORAL_TABLET | ORAL | 0 refills | Status: DC

## 2019-08-18 NOTE — Progress Notes (Signed)
Office Visit Note   Patient: Elijah Ashley           Date of Birth: [birth year]           MRN: [id number] Visit Date: 08/18/2019              Requested by: Cassandria Anger, MD [place],  [place] [address] PCP: Cassandria Anger, MD   Assessment & Plan: Visit Diagnoses:  1. Protrusion of cervical intervertebral disc     Plan: Patient had previous Dosepak in April with good relief.  He has had pain in his neck and left shoulder and arm since last year.  We will repeat some prednisone 10 mg that he can take.  If he is having persistent problems he will call and we will proceed with a repeat cervical MRI scan.  I discussed with him that this likely is from progression of the paracentral disc protrusion at the C3 4-5 level above his solid C5-6 fusion.  Pathophysiology discussed.  He will call if he is having increasing problems.  Follow-Up Instructions: No follow-ups on file.   Orders:  No orders of the defined types were placed in this encounter.  Meds ordered this encounter  Medications  . predniSONE (DELTASONE) 10 MG tablet    Sig: Take 2 po daily times 2 days then one po daily for one week    Dispense:  20 tablet    Refill:  0      Procedures: No procedures performed   Clinical Data: No additional findings.   Subjective: Chief Complaint  Patient presents with  . Neck - Pain  . Right Shoulder - Pain  . Left Shoulder - Pain    HPI 53 year old male seen with left shoulder pain.  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  He has had some numbness and tingling in his feet that he is noticed but left greater than right shoulder pain.  Previous C5-6 cervical fusion 2008.  MRI scan 5 years ago showed some disc protrusion left paracentral on the left at the C4-5 level.  He is not lost strength in his hand.  Pain radiates into his shoulder blade.  No falls no problems with stairs.  Review of Systems previous cervical fusion C5-6 2008.  C4-5 disc  protrusion 2015, left.  Cardiovascular respiratory otherwise negative.  He does have problems with impacted wisdom tooth and is scheduled for surgery today.   Objective: Vital Signs: BP 108/68   Pulse 63   Ht 6\' 5"  (1.956 m)   Wt 250 lb (113.4 kg)   BMI 29.65 kg/m   Physical Exam Constitutional:      Appearance: He is well-developed.  HENT:     Head: Normocephalic and atraumatic.  Eyes:     Pupils: Pupils are equal, round, and reactive to light.  Neck:     Thyroid: No thyromegaly.     Trachea: No tracheal deviation.  Cardiovascular:     Rate and Rhythm: Normal rate.  Pulmonary:     Effort: Pulmonary effort is normal.     Breath sounds: No wheezing.  Abdominal:     General: Bowel sounds are normal.     Palpations: Abdomen is soft.  Skin:    General: Skin is warm and dry.     Capillary Refill: Capillary refill takes less than 2 seconds.  Neurological:     Mental Status: He is alert and oriented to person, place, and time.  Psychiatric:  Behavior: Behavior normal.        Thought Content: Thought content normal.        Judgment: Judgment normal.     Ortho Exam upper extremity reflexes are 2+ and symmetrical mild brachial plexus tenderness left.  Well-healed anterior cervical incision left transverse incision on the neck.  Some pain with positive Spurling.  No isolated motor weakness wrist flexion extension biceps triceps.  Negative drop arm test negative Neer test negative Hawkins test.  Specialty Comments:  No specialty comments available.  Imaging: No results found.   PMFS History: Patient Active Problem List   Diagnosis Date Noted  . Epigastric pain 07/22/2019  . Allergic rhinitis 07/22/2019  . Wheezing 07/22/2019  . Dyslipidemia 11/09/2018  . Hoarseness 11/09/2018  . Bladder neck obstruction 11/09/2018  . Generalized anxiety disorder 10/18/2018  . Major depressive disorder, recurrent episode, in full remission ([place]) 10/18/2018  . Protrusion of  cervical intervertebral disc 06/30/2018  . S/P cervical spinal fusion 05/11/2018  . Cervical pain (neck) 04/02/2018  . Ingrowing hair 01/02/2018  . Elevated LFTs 11/20/2017  . Urinary tract infection 10/14/2017  . Fever and chills 10/14/2017  . Paresthesia 10/14/2017  . Hx of adenomatous polyp of colon 10/02/2017  . Family history of colon cancer - brother 44's and mother 70's 09/24/2017  . Bipolar depression ([place]) 03/20/2017  . Overweight (BMI 25.0-29.9) 03/20/2017  . Family history of colitis 03/20/2017  . Patellar tendinitis 08/04/2015  . Anxiety   . Glaucoma   . URINARY RETENTION 09/17/2010  . Hypothyroidism 09/12/2010  . LOW BACK PAIN 09/12/2010   Past Medical History:  Diagnosis Date  . Allergy   . Anxiety   . Depression   . GERD (gastroesophageal reflux disease)    past hx- some now that comes and goes  . Glaucoma (increased eye pressure)   . Hemorrhoids    pt states he gets regular flares with these   . Hx of adenomatous polyp of colon 10/02/2017  . Hyperlipidemia    slightly elevated- working on diet and exercise, no meds   . HYPOTHYROIDISM    postsurgical  . LOW BACK PAIN    L4-5 degen disc on MRI - s/p ESI 09/2010  . 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 Psoriasis   . URINARY RETENTION     Family History  Problem Relation Age of Onset  . Breast cancer Other   . Cervical cancer Other   . Colon cancer Other   . Hypertension Other   . Ulcerative colitis Sister   . Diabetes Father   . Pancreatic cancer Sister 38       ? colon involvement  . Colon cancer Brother 49       50's  . Colon cancer Mother 66       70's  . Rectal cancer Neg Hx   . Stomach cancer Neg Hx     Past Surgical History:  Procedure Laterality Date  . CERVICAL LAMINECTOMY  07/2007   C5-6 ACDF due to myelopathy , C3-6 per report   . TOTAL THYROIDECTOMY  03/2009   graves and cold nodule (papillary ca)   Social History    Occupational History  . Occupation: SORTER    Employer: [employer]  Tobacco Use  . Smoking status: Never Smoker  . Smokeless tobacco: Never Used  Substance and Sexual Activity  . Alcohol use: Yes    Comment: socially only   .  Drug use: No  . Sexual activity: Yes

## 2019-11-30 ENCOUNTER — Other Ambulatory Visit: Payer: Self-pay | Admitting: *Deleted

## 2019-11-30 MED ORDER — ALBUTEROL SULFATE HFA 108 (90 BASE) MCG/ACT IN AERS: 2.0000 | INHALATION_SPRAY | Freq: Four times a day (QID) | RESPIRATORY_TRACT | 1 refills | Status: DC | PRN

## 2019-12-21 ENCOUNTER — Encounter: Payer: Self-pay | Admitting: Family Medicine

## 2019-12-21 ENCOUNTER — Ambulatory Visit (INDEPENDENT_AMBULATORY_CARE_PROVIDER_SITE_OTHER): Payer: BC Managed Care – PPO

## 2019-12-21 ENCOUNTER — Ambulatory Visit (INDEPENDENT_AMBULATORY_CARE_PROVIDER_SITE_OTHER): Payer: BC Managed Care – PPO | Admitting: Family Medicine

## 2019-12-21 ENCOUNTER — Other Ambulatory Visit: Payer: Self-pay

## 2019-12-21 VITALS — BP 122/78 | HR 73 | Ht 77.0 in | Wt 253.0 lb

## 2019-12-21 DIAGNOSIS — M2141 Flat foot [pes planus] (acquired), right foot: Secondary | ICD-10-CM

## 2019-12-21 DIAGNOSIS — M2142 Flat foot [pes planus] (acquired), left foot: Secondary | ICD-10-CM

## 2019-12-21 DIAGNOSIS — M5020 Other cervical disc displacement, unspecified cervical region: Secondary | ICD-10-CM | POA: Diagnosis not present

## 2019-12-21 DIAGNOSIS — M25512 Pain in left shoulder: Secondary | ICD-10-CM

## 2019-12-21 DIAGNOSIS — G8929 Other chronic pain: Secondary | ICD-10-CM | POA: Diagnosis not present

## 2019-12-21 DIAGNOSIS — M7552 Bursitis of left shoulder: Secondary | ICD-10-CM | POA: Diagnosis not present

## 2019-12-21 DIAGNOSIS — M2140 Flat foot [pes planus] (acquired), unspecified foot: Secondary | ICD-10-CM | POA: Insufficient documentation

## 2019-12-21 NOTE — Assessment & Plan Note (Signed)
Discussed proper shoes, over-the-counter orthotics, do not feel that custom are necessary at the moment.  Patient will be following up again in 4 to 8 weeks.

## 2019-12-21 NOTE — Patient Instructions (Addendum)
Good to see you.  Injected shoulder today Ice 20 minutes 2 times daily. Usually after activity and before bed. Exercises 3 times a week.  Turmeric 500mg  daily  Tart cherry extract 1200mg  at night Vitamin D 2000 IU daily  [employer] Total Support See me again in 5-6 weeks

## 2019-12-21 NOTE — Assessment & Plan Note (Signed)
Injected today.  Discussed HEP, discussed which activities to do. Icing and pennsaid rtc in 8 weeks

## 2019-12-21 NOTE — Assessment & Plan Note (Signed)
Could be playing role will monitor

## 2019-12-21 NOTE — Progress Notes (Signed)
[facility] [place] Phone: [phone] Subjective:     CC: left shoulder pain   [id number]  Elijah Ashley is a 54 y.o. male coming in with complaint of left shoulder. Last seen in 2016 for patellar tendonitis. Patient states that he has been having shoulder pain for 9 months. Pain increases at night. Unable to pinpoint where pain is at but does have radiating symptoms down the arm especially at night. Dull ache throughout the day. History of cervical spine surgery by Dr. Lorin Mercy and states that he has bulging disc at level below surgery. Does stretch to alleviate the neck pain.   Also mentions that his feet are bothering him. Feels tingling near end of day when he takes his shoes and socks off. Feels like he has compression socks on when he takes his socks off. Takes Voltaren tabs to help with pain.   Is having right hip pain over lateral aspect. Pain increases with sitting. Does go to the gym and pain on side diminishes.     Past Medical History:  Diagnosis Date  . Allergy   . Anxiety   . Depression   . GERD (gastroesophageal reflux disease)    past hx- some now that comes and goes  . Glaucoma (increased eye pressure)   . Hemorrhoids    pt states he gets regular flares with these   . Hx of adenomatous polyp of colon 10/02/2017  . Hyperlipidemia    slightly elevated- working on diet and exercise, no meds   . HYPOTHYROIDISM    postsurgical  . LOW BACK PAIN    L4-5 degen disc on MRI - s/p ESI 09/2010  . 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 Psoriasis   . URINARY RETENTION    Past Surgical History:  Procedure Laterality Date  . CERVICAL LAMINECTOMY  07/2007   C5-6 ACDF due to myelopathy , C3-6 per report   . TOTAL THYROIDECTOMY  03/2009   graves and cold nodule (papillary ca)   Social History   Socioeconomic History  . Marital status:  Married    Spouse name: Not on file  . Number of children: Not on file  . Years of education: Not on file  . Highest education level: Not on file  Occupational History  . Occupation: SORTER    Employer: [employer]  Tobacco Use  . Smoking status: Never Smoker  . Smokeless tobacco: Never Used  Substance and Sexual Activity  . Alcohol use: Yes    Comment: socially only   . Drug use: No  . Sexual activity: Yes  Other Topics Concern  . Not on file  Social History Narrative   Married, lives with spouse and dtr   Works at [employer] -    Former smoker   Social Determinants of Radio broadcast assistant Strain:   . Difficulty of Paying Living Expenses: Not on file  Food Insecurity:   . Worried About Charity fundraiser in the Last Year: Not on file  . Ran Out of Food in the Last Year: Not on file  Transportation Needs:   . Lack of Transportation (Medical): Not on file  . Lack of Transportation (Non-Medical): Not on file  Physical Activity:   . Days of Exercise per Week: Not on file  . Minutes of Exercise per Session: Not on file  Stress:   . Feeling of  Stress : Not on file  Social Connections:   . Frequency of Communication with Friends and Family: Not on file  . Frequency of Social Gatherings with Friends and Family: Not on file  . Attends Religious Services: Not on file  . Active Member of Clubs or Organizations: Not on file  . Attends Archivist Meetings: Not on file  . Marital Status: Not on file   No Known Allergies Family History  Problem Relation Age of Onset  . Breast cancer Other   . Cervical cancer Other   . Colon cancer Other   . Hypertension Other   . Ulcerative colitis Sister   . Diabetes Father   . Pancreatic cancer Sister 51       ? colon involvement  . Colon cancer Brother 66       50's  . Colon cancer Mother 56       70's  . Rectal cancer Neg Hx   . Stomach cancer Neg Hx     Current Outpatient Medications (Endocrine & Metabolic):  .   levothyroxine (SYNTHROID, LEVOTHROID) 175 MCG tablet, 1 TABLET EVERY MORNING ON AN EMPTY STOMACH ONCE A DAY EVERY DAY ORALLY 90 DAYS .  levothyroxine (SYNTHROID, LEVOTHROID) 200 MCG tablet, Take 175 mcg by mouth daily.  .  predniSONE (DELTASONE) 10 MG tablet, Take 2 po daily times 2 days then one po daily for one week .  predniSONE (STERAPRED UNI-PAK 21 TAB) 10 MG (21) TBPK tablet, Take as directed.     Current Outpatient Medications (Respiratory):  .  albuterol (VENTOLIN HFA) 108 (90 Base) MCG/ACT inhaler, Inhale 2 puffs into the lungs every 6 (six) hours as needed for wheezing or shortness of breath. .  fexofenadine (ALLEGRA) 180 MG tablet, Take 1 tablet (180 mg total) by mouth daily. Marland Kitchen  triamcinolone (NASACORT) 55 MCG/ACT AERO nasal inhaler, Place 2 sprays into the nose daily.       Current Outpatient Medications (Other):  Marland Kitchen  Cholecalciferol (VITAMIN D3) 2000 units capsule, Take 1 capsule (2,000 Units total) by mouth daily. .  clonazePAM (KLONOPIN) 1 MG tablet, Take 1 tablet (1 mg total) by mouth 2 (two) times daily as needed for anxiety. .  cyclobenzaprine (FLEXERIL) 10 MG tablet, TAKE 1 TABLET BY MOUTH EVERYDAY AT BEDTIME .  dorzolamide-timolol (COSOPT) 22.3-6.8 MG/ML ophthalmic solution, Place 1 drop into both eyes 2 (two) times daily.   Marland Kitchen  FLUoxetine (PROZAC) 40 MG capsule, Take 1 capsule by mouth total 40 mg daily after work .  Glucosamine-Chondroit-Vit C-Mn (GLUCOSAMINE 1500 COMPLEX PO), Take by mouth as needed. .  latanoprost (XALATAN) 0.005 % ophthalmic solution, Place 1 drop into both eyes at bedtime. .  mupirocin ointment (BACTROBAN) 2 %, Use qid R nostril .  Omega-3 Fatty Acids (FISH OIL) 1000 MG CAPS, Take by mouth daily. .  ondansetron (ZOFRAN) 4 MG tablet, Take 1 tablet (4 mg total) by mouth every 8 (eight) hours as needed for nausea or vomiting. Marland Kitchen  OVER THE COUNTER MEDICATION, Take 1 capsule by mouth daily. Ginger and tumeric daily .  pantoprazole (PROTONIX) 40 MG  tablet, Take 1 tablet (40 mg total) by mouth daily.  Current Facility-Administered Medications (Other):  .  0.9 %  sodium chloride infusion    Past medical history, social, surgical and family history all reviewed in electronic medical record.  No pertanent information unless stated regarding to the chief complaint.   Review of Systems:  No headache, visual changes, nausea, vomiting, diarrhea, constipation, dizziness,  abdominal pain, skin rash, fevers, chills, night sweats, weight loss, swollen lymph nodes, body aches, joint swelling, muscle aches, chest pain, shortness of breath, mood changes.   Objective  Blood pressure 122/78, pulse 73, height 6\' 5"  (1.956 m), weight 253 lb (114.8 kg), SpO2 98 %.    General: No apparent distress alert and oriented x3 mood and affect normal, dressed appropriately.  HEENT: Pupils equal, extraocular movements intact  Respiratory: Patient's speak in full sentences and does not appear short of breath  Cardiovascular: No lower extremity edema, non tender, no erythema  Skin: Warm dry intact with no signs of infection or rash on extremities or on axial skeleton.  Abdomen: Soft nontender  Neuro: Cranial nerves II through XII are intact, neurovascularly intact in all extremities with 2+ DTRs and 2+ pulses.  Lymph: No lymphadenopathy of posterior or anterior cervical chain or axillae bilaterally.  Gait normal with good balance and coordination.  MSK:  Non tender with full range of motion and good stability and symmetric strength and tone of  elbows, wrist, hip, knee and ankles bilaterally.   Foot exam shows some mild pes planus with mild overpronation of the hindfoot bilaterally and mild breakdown of the transverse arch.   Shoulder: left Inspection reveals no abnormalities, atrophy or asymmetry. Palpation is normal with no tenderness over AC joint or bicipital groove. ROM is full in all planes passively. Rotator cuff strength normal throughout. signs of  impingement with positive Neer and Hawkin's tests, but negative empty can sign. Speeds and Yergason's tests normal. No labral pathology noted with negative Obrien's, negative clunk and good stability. Normal scapular function observed. No painful arc and no drop arm sign. No apprehension sign  MSK US performed of: left This study was ordered, performed, and interpreted by Charlann Boxer D.O.  Shoulder:   Supraspinatus:  Appears normal on long and transverse views, Bursal bulge seen with shoulder abduction on impingement view. Infraspinatus:  Appears normal on long and transverse views. Significant increase in Doppler flow Subscapularis:  Appears normal on long and transverse views. Positive bursa Teres Minor:  Appears normal on long and transverse views. AC joint:  Capsule undistended, no geyser sign. Glenohumeral Joint:  Appears normal without effusion. Glenoid Labrum:  Intact without visualized tears. Biceps Tendon:  Appears normal on long and transverse views, no fraying of tendon, tendon located in intertubercular groove, no subluxation with shoulder internal or external rotation.  Impression: Subacromial bursitis  Procedure: Real-time Ultrasound Guided Injection of left glenohumeral joint Device: GE Logiq E  Ultrasound guided injection is preferred based studies that show increased duration, increased effect, greater accuracy, decreased procedural pain, increased response rate with ultrasound guided versus blind injection.  Verbal informed consent obtained.  Time-out conducted.  Noted no overlying erythema, induration, or other signs of local infection.  Skin prepped in a sterile fashion.  Local anesthesia: Topical Ethyl chloride.  With sterile technique and under real time ultrasound guidance:  Joint visualized.  23g 1  inch needle inserted posterior approach. Pictures taken for needle placement. Patient did have injection of 2 cc of 1% lidocaine, 2 cc of 0.5% Marcaine, and 1.0 cc of  Kenalog 40 mg/dL. Completed without difficulty  Pain immediately resolved suggesting accurate placement of the medication.  Advised to call if fevers/chills, erythema, induration, drainage, or persistent bleeding.  Images permanently stored and available for review in the ultrasound unit.  Impression: Technically successful ultrasound guided injection.  [id number]; 15 additional minutes spent for Therapeutic exercises as stated in above  notes.  This included exercises focusing on stretching, strengthening, with significant focus on eccentric aspects.   Long term goals include an improvement in range of motion, strength, endurance as well as avoiding reinjury. Patient's frequency would include in 1-2 times a day, 3-5 times a week for a duration of 6-12 weeks.  Shoulder Exercises that included:  Basic scapular stabilization to include adduction and depression of scapula Scaption, focusing on proper movement and good control Internal and External rotation utilizing a theraband, with elbow tucked at side entire time Rows with theraband    Proper technique shown and discussed handout in great detail with ATC.  All questions were discussed and answered.      Impression and Recommendations:     This case required medical decision making of moderate complexity. The above documentation has been reviewed and is accurate and complete Elijah Pulley, DO       Note: This dictation was prepared with Dragon dictation along with smaller phrase technology. Any transcriptional errors that result from this process are unintentional.

## 2020-01-14 ENCOUNTER — Other Ambulatory Visit: Payer: Self-pay | Admitting: Internal Medicine

## 2020-02-01 ENCOUNTER — Ambulatory Visit (INDEPENDENT_AMBULATORY_CARE_PROVIDER_SITE_OTHER): Payer: BC Managed Care – PPO | Admitting: Family Medicine

## 2020-02-01 ENCOUNTER — Encounter: Payer: Self-pay | Admitting: Family Medicine

## 2020-02-01 ENCOUNTER — Other Ambulatory Visit: Payer: Self-pay

## 2020-02-01 DIAGNOSIS — M542 Cervicalgia: Secondary | ICD-10-CM | POA: Diagnosis not present

## 2020-02-01 DIAGNOSIS — M7552 Bursitis of left shoulder: Secondary | ICD-10-CM

## 2020-02-01 DIAGNOSIS — M999 Biomechanical lesion, unspecified: Secondary | ICD-10-CM | POA: Diagnosis not present

## 2020-02-01 NOTE — Progress Notes (Signed)
[facility] [address] [place] Phone: [phone] Subjective:   I Elijah Ashley am serving as a Education administrator for Dr. Hulan Saas.  This visit occurred during the SARS-CoV-2 public health emergency.  Safety protocols were in place, including screening questions prior to the visit, additional usage of staff PPE, and extensive cleaning of exam room while observing appropriate contact time as indicated for disinfecting solutions.   I'm seeing this patient by the request  of:  Plotnikov, Evie Lacks, MD  CC: Left shoulder pain  [id number]   12/21/2019 Injected today.  Discussed HEP, discussed which activities to do. Icing and pennsaid rtc in 8 weeks   Discussed proper shoes, over-the-counter orthotics, do not feel that custom are necessary at the moment.  Patient will be following up again in 4 to 8 weeks.   02/01/2020 Xao Cortopassi Mckinnie is a 54 y.o. male coming in with complaint of left shoulder pain. Patient states he has felt good since the injection. Pain lingered about a week after injection.  Continuing to have upper neck and back pain.  Patient states it is worse when he is driving on a regular basis.  States that he is concerned that some of it is secondary to his neck.  Patient did have a history of a fusion previously.     Past Medical History:  Diagnosis Date  . Allergy   . Anxiety   . Depression   . GERD (gastroesophageal reflux disease)    past hx- some now that comes and goes  . Glaucoma (increased eye pressure)   . Hemorrhoids    pt states he gets regular flares with these   . Hx of adenomatous polyp of colon 10/02/2017  . Hyperlipidemia    slightly elevated- working on diet and exercise, no meds   . HYPOTHYROIDISM    postsurgical  . LOW BACK PAIN    L4-5 degen disc on MRI - s/p ESI 09/2010  . 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 Psoriasis   . URINARY  RETENTION    Past Surgical History:  Procedure Laterality Date  . CERVICAL LAMINECTOMY  07/2007   C5-6 ACDF due to myelopathy , C3-6 per report   . TOTAL THYROIDECTOMY  03/2009   graves and cold nodule (papillary ca)   Social History   Socioeconomic History  . Marital status: Married    Spouse name: Not on file  . Number of children: Not on file  . Years of education: Not on file  . Highest education level: Not on file  Occupational History  . Occupation: SORTER    Employer: [employer]  Tobacco Use  . Smoking status: Never Smoker  . Smokeless tobacco: Never Used  Substance and Sexual Activity  . Alcohol use: Yes    Comment: socially only   . Drug use: No  . Sexual activity: Yes  Other Topics Concern  . Not on file  Social History Narrative   Married, lives with spouse and dtr   Works at [employer] -    Former smoker   Social Determinants of Radio broadcast assistant Strain:   . Difficulty of Paying Living Expenses: Not on file  Food Insecurity:   . Worried About Charity fundraiser in the Last Year: Not on file  . Ran Out of Food in the Last Year: Not on file  Transportation Needs:   . Lack  of Transportation (Medical): Not on file  . Lack of Transportation (Non-Medical): Not on file  Physical Activity:   . Days of Exercise per Week: Not on file  . Minutes of Exercise per Session: Not on file  Stress:   . Feeling of Stress : Not on file  Social Connections:   . Frequency of Communication with Friends and Family: Not on file  . Frequency of Social Gatherings with Friends and Family: Not on file  . Attends Religious Services: Not on file  . Active Member of Clubs or Organizations: Not on file  . Attends Archivist Meetings: Not on file  . Marital Status: Not on file   No Known Allergies Family History  Problem Relation Age of Onset  . Breast cancer Other   . Cervical cancer Other   . Colon cancer Other   . Hypertension Other   . Ulcerative colitis Sister     . Diabetes Father   . Pancreatic cancer Sister 96       ? colon involvement  . Colon cancer Brother 32       50's  . Colon cancer Mother 34       70's  . Rectal cancer Neg Hx   . Stomach cancer Neg Hx     Current Outpatient Medications (Endocrine & Metabolic):  .  levothyroxine (SYNTHROID, LEVOTHROID) 175 MCG tablet, 1 TABLET EVERY MORNING ON AN EMPTY STOMACH ONCE A DAY EVERY DAY ORALLY 90 DAYS .  levothyroxine (SYNTHROID, LEVOTHROID) 200 MCG tablet, Take 175 mcg by mouth daily.  .  predniSONE (DELTASONE) 10 MG tablet, Take 2 po daily times 2 days then one po daily for one week .  predniSONE (STERAPRED UNI-PAK 21 TAB) 10 MG (21) TBPK tablet, Take as directed.     Current Outpatient Medications (Respiratory):  .  albuterol (VENTOLIN HFA) 108 (90 Base) MCG/ACT inhaler, Inhale 2 puffs into the lungs every 6 (six) hours as needed for wheezing or shortness of breath. .  fexofenadine (ALLEGRA) 180 MG tablet, Take 1 tablet (180 mg total) by mouth daily. Marland Kitchen  triamcinolone (NASACORT) 55 MCG/ACT AERO nasal inhaler, Place 2 sprays into the nose daily.       Current Outpatient Medications (Other):  Marland Kitchen  Cholecalciferol (VITAMIN D3) 2000 units capsule, Take 1 capsule (2,000 Units total) by mouth daily. .  clonazePAM (KLONOPIN) 1 MG tablet, Take 1 tablet (1 mg total) by mouth 2 (two) times daily as needed for anxiety. .  cyclobenzaprine (FLEXERIL) 10 MG tablet, TAKE 1 TABLET BY MOUTH EVERYDAY AT BEDTIME .  dorzolamide-timolol (COSOPT) 22.3-6.8 MG/ML ophthalmic solution, Place 1 drop into both eyes 2 (two) times daily.   Marland Kitchen  FLUoxetine (PROZAC) 40 MG capsule, Take 1 capsule by mouth total 40 mg daily after work .  Glucosamine-Chondroit-Vit C-Mn (GLUCOSAMINE 1500 COMPLEX PO), Take by mouth as needed. .  latanoprost (XALATAN) 0.005 % ophthalmic solution, Place 1 drop into both eyes at bedtime. .  mupirocin ointment (BACTROBAN) 2 %, Use qid R nostril .  Omega-3 Fatty Acids (FISH OIL) 1000 MG CAPS,  Take by mouth daily. .  ondansetron (ZOFRAN) 4 MG tablet, Take 1 tablet (4 mg total) by mouth every 8 (eight) hours as needed for nausea or vomiting. Marland Kitchen  OVER THE COUNTER MEDICATION, Take 1 capsule by mouth daily. Ginger and tumeric daily .  pantoprazole (PROTONIX) 40 MG tablet, TAKE 1 TABLET BY MOUTH EVERY DAY  Current Facility-Administered Medications (Other):  .  0.9 %  sodium  chloride infusion   Reviewed prior external information including notes and imaging from  primary care provider As well as notes that were available from care everywhere and other healthcare systems.  Past medical history, social, surgical and family history all reviewed in electronic medical record.  No pertanent information unless stated regarding to the chief complaint.   Review of Systems:  No headache, visual changes, nausea, vomiting, diarrhea, constipation, dizziness, abdominal pain, skin rash, fevers, chills, night sweats, weight loss, swollen lymph nodes, body aches, joint swelling, chest pain, shortness of breath, mood changes. POSITIVE muscle aches  Objective  Blood pressure 110/74, pulse 81, height 6\' 5"  (1.956 m), weight 254 lb (115.2 kg), SpO2 97 %.   General: No apparent distress alert and oriented x3 mood and affect normal, dressed appropriately.  HEENT: Pupils equal, extraocular movements intact  Respiratory: Patient's speak in full sentences and does not appear short of breath  Cardiovascular: No lower extremity edema, non tender, no erythema  Skin: Warm dry intact with no signs of infection or rash on extremities or on axial skeleton.  Abdomen: Soft nontender  Neuro: Cranial nerves II through XII are intact, neurovascularly intact in all extremities with 2+ DTRs and 2+ pulses.  Lymph: No lymphadenopathy of posterior or anterior cervical chain or axillae bilaterally.  Gait normal with good balance and coordination.  MSK:  Non tender with full range of motion and good stability and symmetric  strength and tone of shoulders, elbows, wrist, hip, knee and ankles bilaterally.  Left shoulder exam is unremarkable.  Patient though does have unfortunately tightness noted in the parascapular region medially.  Patient does have some tightness of the neck noted lacking the last 5 degrees of extension in the last 5 to 10 degrees of flexion.  Negative Spurling's though noted today  Osteopathic findings C2 flexed rotated and side bent right C6 flexed rotated and side bent left T3 extended rotated and side bent right inhaled third rib T7 extended rotated and side bent left    Impression and Recommendations:     This case required medical decision making of moderate complexity. The above documentation has been reviewed and is accurate and complete Lyndal Pulley, DO       Note: This dictation was prepared with Dragon dictation along with smaller phrase technology. Any transcriptional errors that result from this process are unintentional.

## 2020-02-01 NOTE — Assessment & Plan Note (Signed)
Significant improvement nearly 100% better at this time.  Patient still continuing to have neck pain.  Likely contributing to some of the aches and pains.  Discussed posture ergonomics and scapular exercises that I think will be helpful.  Follow-up again in 4 to 8 weeks.

## 2020-02-01 NOTE — Assessment & Plan Note (Signed)
Patient has had a fusion previously at C4-5.  Attempted osteopathic manipulation of the left muscle energy today.  Tolerated the procedure significantly well.  Discussed posture and ergonomics, we discussed ergonomics and with pain driving a significant amount but I think will be beneficial.  Patient will increase activity as tolerated.  Follow-up again in 4 to 8 weeks

## 2020-02-01 NOTE — Patient Instructions (Signed)
ANA, ESR, Vit D, RF, B12 Tennis ball between shoulder blades when driving Keep monitor at eye level Tried manipulation today See me in 4-6 weeks

## 2020-02-01 NOTE — Assessment & Plan Note (Signed)
Decision today to treat with OMT was based on Physical Exam  After verbal consent patient was treated with  ME, FPR techniques in cervical, thoracic, rib,areas  Patient tolerated the procedure well with improvement in symptoms  Patient given exercises, stretches and lifestyle modifications  See medications in patient instructions if given  Patient will follow up in 4-8 weeks

## 2020-02-04 ENCOUNTER — Other Ambulatory Visit: Payer: Self-pay

## 2020-02-04 ENCOUNTER — Telehealth: Payer: Self-pay

## 2020-02-04 DIAGNOSIS — M2550 Pain in unspecified joint: Secondary | ICD-10-CM

## 2020-02-04 NOTE — Telephone Encounter (Signed)
Spoke with patient that orders are in.

## 2020-02-04 NOTE — Telephone Encounter (Signed)
Patient called back stating that he does need the labs put in by Dr. Tamala Julian that were discussed at his visit. He thought he could get them placed by his PCP but he was having issues. Patient would like a call back when they are placed

## 2020-02-07 ENCOUNTER — Other Ambulatory Visit (INDEPENDENT_AMBULATORY_CARE_PROVIDER_SITE_OTHER): Payer: BC Managed Care – PPO

## 2020-02-07 DIAGNOSIS — M2550 Pain in unspecified joint: Secondary | ICD-10-CM | POA: Diagnosis not present

## 2020-02-07 LAB — VITAMIN D 25 HYDROXY (VIT D DEFICIENCY, FRACTURES): VITD: 36.86 ng/mL (ref 30.00–100.00)

## 2020-02-07 LAB — VITAMIN B12: Vitamin B-12: 850 pg/mL (ref 211–911)

## 2020-02-07 LAB — SEDIMENTATION RATE: Sed Rate: 16 mm/hr (ref 0–20)

## 2020-02-09 LAB — RHEUMATOID FACTOR: Rheumatoid fact SerPl-aCnc: <14 IU/mL (ref <14)

## 2020-02-09 LAB — ANA: Anti Nuclear Antibody (ANA): NEGATIVE

## 2020-02-29 ENCOUNTER — Other Ambulatory Visit: Payer: Self-pay

## 2020-02-29 DIAGNOSIS — F411 Generalized anxiety disorder: Secondary | ICD-10-CM

## 2020-02-29 MED ORDER — CLONAZEPAM 1 MG PO TABS: 1.0000 mg | ORAL_TABLET | Freq: Two times a day (BID) | ORAL | 0 refills | Status: DC | PRN

## 2020-03-21 ENCOUNTER — Ambulatory Visit: Payer: BC Managed Care – PPO | Admitting: Family Medicine

## 2020-03-21 ENCOUNTER — Ambulatory Visit (INDEPENDENT_AMBULATORY_CARE_PROVIDER_SITE_OTHER): Payer: BC Managed Care – PPO

## 2020-03-21 ENCOUNTER — Other Ambulatory Visit: Payer: Self-pay

## 2020-03-21 ENCOUNTER — Encounter: Payer: Self-pay | Admitting: Family Medicine

## 2020-03-21 VITALS — BP 100/78 | HR 87 | Ht 77.0 in | Wt 252.0 lb

## 2020-03-21 DIAGNOSIS — M25512 Pain in left shoulder: Secondary | ICD-10-CM | POA: Diagnosis not present

## 2020-03-21 DIAGNOSIS — G8929 Other chronic pain: Secondary | ICD-10-CM

## 2020-03-21 DIAGNOSIS — M999 Biomechanical lesion, unspecified: Secondary | ICD-10-CM | POA: Diagnosis not present

## 2020-03-21 DIAGNOSIS — M25519 Pain in unspecified shoulder: Secondary | ICD-10-CM | POA: Insufficient documentation

## 2020-03-21 MED ORDER — MELOXICAM 7.5 MG PO TABS: 7.5000 mg | ORAL_TABLET | Freq: Every day | ORAL | 0 refills | Status: DC

## 2020-03-21 NOTE — Patient Instructions (Addendum)
Good to see you  Meloxicam sent in See me again in 6 weeks

## 2020-03-21 NOTE — Progress Notes (Signed)
[place] [address] [place] Phone: [phone] Subjective:   I Elijah Ashley am serving as a Education administrator for Dr. Hulan Saas.  This visit occurred during the SARS-CoV-2 public health emergency.  Safety protocols were in place, including screening questions prior to the visit, additional usage of staff PPE, and extensive cleaning of exam room while observing appropriate contact time as indicated for disinfecting solutions.   I'm seeing this patient by the request  of:  Plotnikov, Evie Lacks, MD  CC: Neck pain, shoulder pain follow-up  [id number]   02/01/2020 Significant improvement nearly 100% better at this time.  Patient still continuing to have neck pain.  Likely contributing to some of the aches and pains.  Discussed posture ergonomics and scapular exercises that I think will be helpful.  Follow-up again in 4 to 8 weeks.  03/21/2020 Elijah Ashley is a 54 y.o. male coming in with complaint of left shoulder pain and back pain. Last seen 02/01/2020 for OMT. Shoulder is not doing well today. Bilateral AC joint pain. Left is worse. Stabbing numbing pain. Numbing pain down the left arm. Laying down at night he can feel the bilateral shoulder pain. States he is using tennis ball for his neck and helps. Doing the exercises and believes they are helping. Walking 2 times a week.  Patient still feels that the other injection was very helpful previously.  Does feel the manipulation is very helpful for his regular discomfort.    Past Medical History:  Diagnosis Date  . Allergy   . Anxiety   . Depression   . GERD (gastroesophageal reflux disease)    past hx- some now that comes and goes  . Glaucoma (increased eye pressure)   . Hemorrhoids    pt states he gets regular flares with these   . Hx of adenomatous polyp of colon 10/02/2017  . Hyperlipidemia    slightly elevated- working on diet and exercise, no meds   . HYPOTHYROIDISM    postsurgical  . LOW BACK PAIN    L4-5 degen disc on MRI - s/p ESI 09/2010  . 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 Psoriasis   . URINARY RETENTION    Past Surgical History:  Procedure Laterality Date  . CERVICAL LAMINECTOMY  07/2007   C5-6 ACDF due to myelopathy , C3-6 per report   . TOTAL THYROIDECTOMY  03/2009   graves and cold nodule (papillary ca)   Social History   Socioeconomic History  . Marital status: Married    Spouse name: Not on file  . Number of children: Not on file  . Years of education: Not on file  . Highest education level: Not on file  Occupational History  . Occupation: SORTER    Employer: [employer]  Tobacco Use  . Smoking status: Never Smoker  . Smokeless tobacco: Never Used  Substance and Sexual Activity  . Alcohol use: Yes    Comment: socially only   . Drug use: No  . Sexual activity: Yes  Other Topics Concern  . Not on file  Social History Narrative   Married, lives with spouse and dtr   Works at [employer] -    Former smoker   Social Determinants of Radio broadcast assistant Strain:   . Difficulty of Paying Living Expenses:   Food Insecurity:   . Worried About Charity fundraiser in the Last Year:   .  Ran Out of Food in the Last Year:   Transportation Needs:   . Film/video editor (Medical):   Marland Kitchen Lack of Transportation (Non-Medical):   Physical Activity:   . Days of Exercise per Week:   . Minutes of Exercise per Session:   Stress:   . Feeling of Stress :   Social Connections:   . Frequency of Communication with Friends and Family:   . Frequency of Social Gatherings with Friends and Family:   . Attends Religious Services:   . Active Member of Clubs or Organizations:   . Attends Archivist Meetings:   Marland Kitchen Marital Status:    No Known Allergies Family History  Problem Relation Age of Onset  . Breast cancer Other   . Cervical cancer Other   . Colon cancer Other   .  Hypertension Other   . Ulcerative colitis Sister   . Diabetes Father   . Pancreatic cancer Sister 87       ? colon involvement  . Colon cancer Brother 41       50's  . Colon cancer Mother 73       70's  . Rectal cancer Neg Hx   . Stomach cancer Neg Hx     Current Outpatient Medications (Endocrine & Metabolic):  .  levothyroxine (SYNTHROID, LEVOTHROID) 175 MCG tablet, 1 TABLET EVERY MORNING ON AN EMPTY STOMACH ONCE A DAY EVERY DAY ORALLY 90 DAYS .  levothyroxine (SYNTHROID, LEVOTHROID) 200 MCG tablet, Take 175 mcg by mouth daily.  .  predniSONE (DELTASONE) 10 MG tablet, Take 2 po daily times 2 days then one po daily for one week .  predniSONE (STERAPRED UNI-PAK 21 TAB) 10 MG (21) TBPK tablet, Take as directed.     Current Outpatient Medications (Respiratory):  .  albuterol (VENTOLIN HFA) 108 (90 Base) MCG/ACT inhaler, Inhale 2 puffs into the lungs every 6 (six) hours as needed for wheezing or shortness of breath. .  fexofenadine (ALLEGRA) 180 MG tablet, Take 1 tablet (180 mg total) by mouth daily. Marland Kitchen  triamcinolone (NASACORT) 55 MCG/ACT AERO nasal inhaler, Place 2 sprays into the nose daily.   Current Outpatient Medications (Analgesics):  .  meloxicam (MOBIC) 7.5 MG tablet, Take 1 tablet (7.5 mg total) by mouth daily.     Current Outpatient Medications (Other):  Marland Kitchen  Cholecalciferol (VITAMIN D3) 2000 units capsule, Take 1 capsule (2,000 Units total) by mouth daily. .  clonazePAM (KLONOPIN) 1 MG tablet, Take 1 tablet (1 mg total) by mouth 2 (two) times daily as needed for anxiety. .  cyclobenzaprine (FLEXERIL) 10 MG tablet, TAKE 1 TABLET BY MOUTH EVERYDAY AT BEDTIME .  dorzolamide-timolol (COSOPT) 22.3-6.8 MG/ML ophthalmic solution, Place 1 drop into both eyes 2 (two) times daily.   Marland Kitchen  FLUoxetine (PROZAC) 40 MG capsule, Take 1 capsule by mouth total 40 mg daily after work .  Glucosamine-Chondroit-Vit C-Mn (GLUCOSAMINE 1500 COMPLEX PO), Take by mouth as needed. .  latanoprost  (XALATAN) 0.005 % ophthalmic solution, Place 1 drop into both eyes at bedtime. .  mupirocin ointment (BACTROBAN) 2 %, Use qid R nostril .  Omega-3 Fatty Acids (FISH OIL) 1000 MG CAPS, Take by mouth daily. .  ondansetron (ZOFRAN) 4 MG tablet, Take 1 tablet (4 mg total) by mouth every 8 (eight) hours as needed for nausea or vomiting. Marland Kitchen  OVER THE COUNTER MEDICATION, Take 1 capsule by mouth daily. Ginger and tumeric daily .  pantoprazole (PROTONIX) 40 MG tablet, TAKE 1 TABLET BY MOUTH  EVERY DAY  Current Facility-Administered Medications (Other):  .  0.9 %  sodium chloride infusion   Reviewed prior external information including notes and imaging from  primary care provider As well as notes that were available from care everywhere and other healthcare systems.  Past medical history, social, surgical and family history all reviewed in electronic medical record.  No pertanent information unless stated regarding to the chief complaint.   Review of Systems:  No headache, visual changes, nausea, vomiting, diarrhea, constipation, dizziness, abdominal pain, skin rash, fevers, chills, night sweats, weight loss, swollen lymph nodes,  joint swelling, chest pain, shortness of breath, mood changes. POSITIVE muscle aches, body aches  Objective  Blood pressure 100/78, pulse 87, height 6\' 5"  (1.956 m), weight 252 lb (114.3 kg), SpO2 96 %.   General: No apparent distress alert and oriented x3 mood and affect normal, dressed appropriately.  HEENT: Pupils equal, extraocular movements intact  Respiratory: Patient's speak in full sentences and does not appear short of breath  Cardiovascular: No lower extremity edema, non tender, no erythema  Neuro: Cranial nerves II through XII are intact, neurovascularly intact in all extremities with 2+ DTRs and 2+ pulses.  Gait normal with good balance and coordination.  MSK:  Non tender with full range of motion and good stability and symmetric strength and tone of  elbows,  wrist, hip, knee and ankles bilaterally.  Left shoulder exam shows the patient does have some mild impingement but more of a positive crossover.  Swelling noted over the acromioclavicular joint compared to the contralateral side.  Neck exam does have some mild loss of lordosis.  Patient does have some lack of extension and flexion of the neck of 5 to 10 degrees.  Negative Spurling's.  Tightness in the parascapular region right greater than left.  Procedure: Real-time Ultrasound Guided Injection of left acromioclavicular joint Device: GE Logiq Q7 Ultrasound guided injection is preferred based studies that show increased duration, increased effect, greater accuracy, decreased procedural pain, increased response rate, and decreased cost with ultrasound guided versus blind injection.  Verbal informed consent obtained.  Time-out conducted.  Noted no overlying erythema, induration, or other signs of local infection.  Skin prepped in a sterile fashion.  Local anesthesia: Topical Ethyl chloride.  With sterile technique and under real time ultrasound guidance: With a 25-gauge half inch needle injected with 0.5 cc of 0.5% Marcaine and 0.5 cc of Kenalog 40 mg/mL Completed without difficulty  Pain immediately resolved suggesting accurate placement of the medication.  Advised to call if fevers/chills, erythema, induration, drainage, or persistent bleeding.  Images permanently stored and available for review in the ultrasound unit.  Impression: Technically successful ultrasound guided injection.  Osteopathic findings  C6 flexed rotated and side bent left T3 extended rotated and side bent right inhaled third rib T6 extended rotated and side bent left     Impression and Recommendations:     This case required medical decision making of moderate complexity. The above documentation has been reviewed and is accurate and complete Lyndal Pulley, DO       Note: This dictation was prepared with  Dragon dictation along with smaller phrase technology. Any transcriptional errors that result from this process are unintentional.

## 2020-03-21 NOTE — Assessment & Plan Note (Signed)
   Decision today to treat with OMT was based on Physical Exam  After verbal consent patient was treated with HVLA, ME, FPR techniques in cervical, thoracic, rib,, all areas are chronic   Patient tolerated the procedure well with improvement in symptoms  Patient given exercises, stretches and lifestyle modifications  See medications in patient instructions if given  Patient will follow up in 4-8 weeks

## 2020-04-04 ENCOUNTER — Ambulatory Visit (INDEPENDENT_AMBULATORY_CARE_PROVIDER_SITE_OTHER): Payer: BC Managed Care – PPO | Admitting: Psychiatry

## 2020-04-04 ENCOUNTER — Encounter: Payer: Self-pay | Admitting: Psychiatry

## 2020-04-04 ENCOUNTER — Other Ambulatory Visit: Payer: Self-pay

## 2020-04-04 VITALS — Ht 77.0 in | Wt 250.0 lb

## 2020-04-04 DIAGNOSIS — F3342 Major depressive disorder, recurrent, in full remission: Secondary | ICD-10-CM

## 2020-04-04 DIAGNOSIS — F330 Major depressive disorder, recurrent, mild: Secondary | ICD-10-CM

## 2020-04-04 DIAGNOSIS — F411 Generalized anxiety disorder: Secondary | ICD-10-CM | POA: Diagnosis not present

## 2020-04-04 MED ORDER — CLONAZEPAM 1 MG PO TABS: 1.0000 mg | ORAL_TABLET | Freq: Two times a day (BID) | ORAL | 1 refills | Status: DC | PRN

## 2020-04-04 MED ORDER — FLUOXETINE HCL 40 MG PO CAPS: ORAL_CAPSULE | ORAL | 1 refills | Status: DC

## 2020-04-04 NOTE — Progress Notes (Signed)
[facility]  Patient ID: Elijah Ashley,  MRN: [id number]  PCP: Cassandria Anger, MD  Date of Evaluation: 04/04/2020 Time spent:35 minutes  From 1040 to 1115 Chief Complaint:  Chief Complaint    Anxiety; Depression; Stress      HISTORY/CURRENT STATUS: Elijah Ashley is seen onsite in office 35 minutes face-to-face conjointly with wife for the first time with consent with epic collateral for psychiatric interview and exam in 3-month evaluation and management being 2 months overdue as he continued to expect Klonopin escribing despite being over the 40-month mandated appointment for generalized anxiety and major depression previously considered for bipolar disorder no longer treated with Symbyax by Dr. Candis Schatz but switch to Prozac alone. Anxiety about general health is one of his most difficult seeming to have wife with him for disengaging from being expected to resolve himself the family problems as also occurred with his family for mother in [place] with dementia now deceased.  Patient particularly attemps to find containment for mother's son Elijah Ashley who is resisting care other than his psychotherapy despite hospitalization by family commitment, so that patient and wife need to discuss Elijah Ashley's family impact and treatment options as they seem to likely overestimate his pathology and underestimate his behavioral problems, as may have occurred with patient's mood disorder in the past.  However Elijah Ashley is generally appropriate in his behavior as he attempts to take responsibility for keeping family satisfied and somewhat collaborative.  He therefore requires this appointment to process the potential treatment approach to the stepson in order to reduce the anxiety and stress for himself.  In the interim, the patient has made adjustments in his psychiatric medications while seeking primary care and specialty care for arthralgias and bursitis with history of cervical disc disease recently treated  with prednisone, open-angle glaucoma, allergic rhinitis and asthma, GERD, vitamin D deficiency, sexual dysfunction, and hypothyroidism.  For sexual dysfunction, PCP changed Prozac reduced 50% and Wellbutrin added at 75 mg 3 times daily.  The patient has in the interim changed back to the 40 mg Prozac without the Wellbutrin.  He finds Klonopin his most helpful medication as always in the past.  He is using exercise and meditation to cope.  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  Patient is careful in his management of all these possibilities and affairs.  He has no mania, suicidality, substance use other than modest social alcohol, psychosis or delirium.   Depression        The patient presents withrecurrent depression most recent episode onset 4 months ago. The onset quality was sudden. The symptoms are intermittent. The problem has been waxing and waningsince onset.Associated symptoms include decreased concentration,apathetic sadness, boredom, excessive worry, nervous anxious behavior, restlessness,body achesand indigestion. Associated symptoms include does not have insomnia, helplessness,hopelessness, appetite change, or suicidal ideas.The symptoms are aggravated by work stress, medication, social issues and family issues.Past treatments include SSRIs - Selective serotonin reuptake inhibitors and other medications.Compliance with treatment is variable.Past compliance problems include difficulty understanding directions, difficulty with treatment plan, medication issues and medical issues.Previous treatment provided moderaterelief.Risk factors include family history, a change in medication usage/dosage, history of mental illness, major life event and stress. Past medical history includes anxiety,depressionand mental health disorder. Pertinent negatives include no chronic fatigue syndrome,no hypothyroidism,no chronic illness,no  life-threatening condition,no physical disability,no recent psychiatric admission,no brain trauma,no obsessive-compulsive disorder,no post-traumatic stress disorder,no schizophrenia,no suicide attemptsand no head trauma.  Individual Medical History/ Review of Systems: Changes? :Yes Weight  is back down 7 pounds from increase of that amount last appointment.  Primary care and specialty care note arthralgias and bursitis with history of cervical disc disease recently treated with prednisone, open-angle glaucoma, allergic rhinitis and asthma, GERD, vitamin D deficiency, sexual dysfunction, and hypothyroidism.  Allergies: Patient has no known allergies.  Current Medications:  Current Outpatient Medications:  .  albuterol (VENTOLIN HFA) 108 (90 Base) MCG/ACT inhaler, Inhale 2 puffs into the lungs every 6 (six) hours as needed for wheezing or shortness of breath., Disp: 18 g, Rfl: 1 .  Cholecalciferol (VITAMIN D3) 2000 units capsule, Take 1 capsule (2,000 Units total) by mouth daily., Disp: 100 capsule, Rfl: 3 .  clonazePAM (KLONOPIN) 1 MG tablet, Take 1 tablet (1 mg total) by mouth 2 (two) times daily as needed for anxiety., Disp: 180 tablet, Rfl: 1 .  cyclobenzaprine (FLEXERIL) 10 MG tablet, TAKE 1 TABLET BY MOUTH EVERYDAY AT BEDTIME, Disp: 30 tablet, Rfl: 0 .  dorzolamide-timolol (COSOPT) 22.3-6.8 MG/ML ophthalmic solution, Place 1 drop into both eyes 2 (two) times daily.  , Disp: , Rfl:  .  fexofenadine (ALLEGRA) 180 MG tablet, Take 1 tablet (180 mg total) by mouth daily., Disp: 90 tablet, Rfl: 3 .  FLUoxetine (PROZAC) 40 MG capsule, Take 1 capsule by mouth total 40 mg daily after work, Disp: 90 capsule, Rfl: 1 .  Glucosamine-Chondroit-Vit C-Mn (GLUCOSAMINE 1500 COMPLEX PO), Take by mouth as needed., Disp: , Rfl:  .  latanoprost (XALATAN) 0.005 % ophthalmic solution, Place 1 drop into both eyes at bedtime., Disp: 2.5 mL, Rfl: 1 .  levothyroxine (SYNTHROID, LEVOTHROID) 175 MCG tablet, 1 TABLET  EVERY MORNING ON AN EMPTY STOMACH ONCE A DAY EVERY DAY ORALLY 90 DAYS, Disp: , Rfl: 3 .  levothyroxine (SYNTHROID, LEVOTHROID) 200 MCG tablet, Take 175 mcg by mouth daily. , Disp: , Rfl:  .  meloxicam (MOBIC) 7.5 MG tablet, Take 1 tablet (7.5 mg total) by mouth daily., Disp: 30 tablet, Rfl: 0 .  mupirocin ointment (BACTROBAN) 2 %, Use qid R nostril, Disp: 15 g, Rfl: 0 .  Omega-3 Fatty Acids (FISH OIL) 1000 MG CAPS, Take by mouth daily., Disp: , Rfl:  .  ondansetron (ZOFRAN) 4 MG tablet, Take 1 tablet (4 mg total) by mouth every 8 (eight) hours as needed for nausea or vomiting., Disp: 20 tablet, Rfl: 1 .  OVER THE COUNTER MEDICATION, Take 1 capsule by mouth daily. Ginger and tumeric daily, Disp: , Rfl:  .  pantoprazole (PROTONIX) 40 MG tablet, TAKE 1 TABLET BY MOUTH EVERY DAY, Disp: 90 tablet, Rfl: 1 .  predniSONE (DELTASONE) 10 MG tablet, Take 2 po daily times 2 days then one po daily for one week, Disp: 20 tablet, Rfl: 0 .  predniSONE (STERAPRED UNI-PAK 21 TAB) 10 MG (21) TBPK tablet, Take as directed., Disp: 21 tablet, Rfl: 0 .  triamcinolone (NASACORT) 55 MCG/ACT AERO nasal inhaler, Place 2 sprays into the nose daily., Disp: 1 Inhaler, Rfl: 12  Current Facility-Administered Medications:  .  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  Yes for current marriage including [place]:  Height 6\' 5"  (1.956 m), weight 250 lb (113.4 kg).Body mass index is 29.65 kg/m. Muscle strengths and tone 5/5, postural reflexes and  gait 0/0, and AIMS = 0 otherwise deferred for coronavirus shutdown  General Appearance: Casual, Fairly Groomed and Guarded  Eye Contact:  Good  Speech:  Clear and Coherent, Slow and Talkative  Volume:  Normal   Mood:  Anxious, Depressed, Dysphoric, Euthymic and Worthless  Affect:  Congruent, Depressed, Inappropriate, Restricted and Anxious  Thought Process:  Coherent, Goal Directed, Irrelevant and Descriptions of Associations: Circumstantial  Orientation:  Full (Time, Place, and Person)  Thought Content: Ilusions, Obsessions and Rumination   Suicidal Thoughts:  No  Homicidal Thoughts:  No  Memory:  Immediate;   Good Remote;   Good  Judgement:  Fair  Insight:  Fair  Psychomotor Activity:  Normal, Mannerisms and Restlessness  Concentration:  Concentration: Fair and Attention Span: Good  Recall:  Good  Fund of Knowledge: Good  Language: Fair  Assets:  Desire for Improvement Leisure Time Resilience Vocational/Educational  ADL's:  Intact  Cognition: WNL  Prognosis:  Fair to good    DIAGNOSES:    ICD-10-CM   1. Generalized anxiety disorder  F41.1 FLUoxetine (PROZAC) 40 MG capsule    clonazePAM (KLONOPIN) 1 MG tablet  2. 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  These are addressed extensively including [place], [facility] family to family, and [place].  The patient tends to become depressed and more anxious as he assumes he is the only family member taking responsibility for family problem solving.  Collaboration is addressed extended to other family members today.  The patient functions best with his current medications with [place] registry documenting last fill of Klonopin from  02/29/2020 occurring on March 29 as number 60 tablets with no refill.  We process patient's responsibility in use of the Klonopin and benefit over time with the Prozac, doubting sexual dysfunction is from Prozac though patient takes a great deal of medication.  He is E scribed to continue Prozac 40 mg every morning sent as #90 with 1 refill to [facility] [address] for generalized anxiety and major depression.  He is E scribed Klonopin 1 mg twice daily as needed for anxiety sent as #180 with 1 refill to [facility] [address] for generalized anxiety.  Therapy resources are addressed and he returns for follow-up and 6 months or sooner if needed.   Delight Hoh, MD

## 2020-04-16 ENCOUNTER — Other Ambulatory Visit: Payer: Self-pay | Admitting: Family Medicine

## 2020-05-02 ENCOUNTER — Encounter: Payer: Self-pay | Admitting: Family Medicine

## 2020-05-02 ENCOUNTER — Other Ambulatory Visit: Payer: Self-pay

## 2020-05-02 ENCOUNTER — Ambulatory Visit (INDEPENDENT_AMBULATORY_CARE_PROVIDER_SITE_OTHER)
Admission: RE | Admit: 2020-05-02 | Discharge: 2020-05-02 | Disposition: A | Discharge status: Home / Self Care | Payer: BC Managed Care – PPO | Source: Ambulatory Visit | Attending: Family Medicine | Admitting: Family Medicine

## 2020-05-02 ENCOUNTER — Ambulatory Visit (INDEPENDENT_AMBULATORY_CARE_PROVIDER_SITE_OTHER): Payer: BC Managed Care – PPO | Admitting: Family Medicine

## 2020-05-02 VITALS — BP 100/80 | HR 83 | Ht 77.0 in | Wt 253.0 lb

## 2020-05-02 DIAGNOSIS — M25512 Pain in left shoulder: Secondary | ICD-10-CM | POA: Diagnosis not present

## 2020-05-02 DIAGNOSIS — M7552 Bursitis of left shoulder: Secondary | ICD-10-CM | POA: Diagnosis not present

## 2020-05-02 DIAGNOSIS — M542 Cervicalgia: Secondary | ICD-10-CM

## 2020-05-02 DIAGNOSIS — G8929 Other chronic pain: Secondary | ICD-10-CM | POA: Diagnosis not present

## 2020-05-02 NOTE — Progress Notes (Signed)
[facility] [address] [place] Phone: [phone] Subjective:   I [name] am serving as a Education administrator for Dr. Hulan Saas.  This visit occurred during the SARS-CoV-2 public health emergency.  Safety protocols were in place, including screening questions prior to the visit, additional usage of staff PPE, and extensive cleaning of exam room while observing appropriate contact time as indicated for disinfecting solutions.   I'm seeing this patient by the request  of:  Plotnikov, Evie Lacks, MD  CC: Shoulder pain, neck pain follow-up  [id number]   Elijah Ashley is a 54 y.o. male coming in with complaint of left shoulder pain. Last seen 03/21/2020 for OMT. States he is doing worse. Every movement he has pain. Anterior pain. Posterior injection helped. AC joint injection did not help. States he is trying to exercise and is determined to get better. Pain wakes him up at night. Neck is worse and TTP.  Patient states that it is affecting daily activities such as his work.  Past Medical History:  Diagnosis Date  . Allergy   . Anxiety   . Depression   . GERD (gastroesophageal reflux disease)    past hx- some now that comes and goes  . Glaucoma (increased eye pressure)   . Hemorrhoids    pt states he gets regular flares with these   . Hx of adenomatous polyp of colon 10/02/2017  . Hyperlipidemia    slightly elevated- working on diet and exercise, no meds   . HYPOTHYROIDISM    postsurgical  . LOW BACK PAIN    L4-5 degen disc on MRI - s/p ESI 09/2010  . 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 Psoriasis   . URINARY RETENTION    Past Surgical History:  Procedure Laterality Date  . CERVICAL LAMINECTOMY  07/2007   C5-6 ACDF due to myelopathy , C3-6 per report   . TOTAL THYROIDECTOMY  03/2009   graves and cold nodule (papillary ca)   Social History   Socioeconomic  History  . Marital status: Married    Spouse name: Not on file  . Number of children: Not on file  . Years of education: Not on file  . Highest education level: Not on file  Occupational History  . Occupation: SORTER    Employer: [employer]  Tobacco Use  . Smoking status: Never Smoker  . Smokeless tobacco: Never Used  Substance and Sexual Activity  . Alcohol use: Yes    Comment: socially only   . Drug use: No  . Sexual activity: Yes  Other Topics Concern  . Not on file  Social History Narrative   Married, lives with spouse and dtr   Works at [employer] -    Former smoker   Social Determinants of Radio broadcast assistant Strain:   . Difficulty of Paying Living Expenses:   Food Insecurity:   . Worried About Charity fundraiser in the Last Year:   . Arboriculturist in the Last Year:   Transportation Needs:   . Film/video editor (Medical):   Marland Kitchen Lack of Transportation (Non-Medical):   Physical Activity:   . Days of Exercise per Week:   . Minutes of Exercise per Session:   Stress:   . Feeling of Stress :   Social Connections:   . Frequency of Communication with Friends and Family:   . Frequency of  Social Gatherings with Friends and Family:   . Attends Religious Services:   . Active Member of Clubs or Organizations:   . Attends Archivist Meetings:   Marland Kitchen Marital Status:    No Known Allergies Family History  Problem Relation Age of Onset  . Breast cancer Other   . Cervical cancer Other   . Colon cancer Other   . Hypertension Other   . Ulcerative colitis Sister   . Diabetes Father   . Pancreatic cancer Sister 38       ? colon involvement  . Colon cancer Brother 20       50's  . Colon cancer Mother 67       70's  . Rectal cancer Neg Hx   . Stomach cancer Neg Hx     Current Outpatient Medications (Endocrine & Metabolic):  .  levothyroxine (SYNTHROID, LEVOTHROID) 175 MCG tablet, 1 TABLET EVERY MORNING ON AN EMPTY STOMACH ONCE A DAY EVERY DAY ORALLY 90 DAYS .   levothyroxine (SYNTHROID, LEVOTHROID) 200 MCG tablet, Take 175 mcg by mouth daily.  .  predniSONE (DELTASONE) 10 MG tablet, Take 2 po daily times 2 days then one po daily for one week .  predniSONE (STERAPRED UNI-PAK 21 TAB) 10 MG (21) TBPK tablet, Take as directed.     Current Outpatient Medications (Respiratory):  .  albuterol (VENTOLIN HFA) 108 (90 Base) MCG/ACT inhaler, Inhale 2 puffs into the lungs every 6 (six) hours as needed for wheezing or shortness of breath. .  fexofenadine (ALLEGRA) 180 MG tablet, Take 1 tablet (180 mg total) by mouth daily. Marland Kitchen  triamcinolone (NASACORT) 55 MCG/ACT AERO nasal inhaler, Place 2 sprays into the nose daily.   Current Outpatient Medications (Analgesics):  .  meloxicam (MOBIC) 7.5 MG tablet, TAKE 1 TABLET BY MOUTH EVERY DAY     Current Outpatient Medications (Other):  Marland Kitchen  Cholecalciferol (VITAMIN D3) 2000 units capsule, Take 1 capsule (2,000 Units total) by mouth daily. .  clonazePAM (KLONOPIN) 1 MG tablet, Take 1 tablet (1 mg total) by mouth 2 (two) times daily as needed for anxiety. .  cyclobenzaprine (FLEXERIL) 10 MG tablet, TAKE 1 TABLET BY MOUTH EVERYDAY AT BEDTIME .  dorzolamide-timolol (COSOPT) 22.3-6.8 MG/ML ophthalmic solution, Place 1 drop into both eyes 2 (two) times daily.   Marland Kitchen  FLUoxetine (PROZAC) 40 MG capsule, Take 1 capsule by mouth total 40 mg daily after work .  Glucosamine-Chondroit-Vit C-Mn (GLUCOSAMINE 1500 COMPLEX PO), Take by mouth as needed. .  latanoprost (XALATAN) 0.005 % ophthalmic solution, Place 1 drop into both eyes at bedtime. .  mupirocin ointment (BACTROBAN) 2 %, Use qid R nostril .  Omega-3 Fatty Acids (FISH OIL) 1000 MG CAPS, Take by mouth daily. .  ondansetron (ZOFRAN) 4 MG tablet, Take 1 tablet (4 mg total) by mouth every 8 (eight) hours as needed for nausea or vomiting. Marland Kitchen  OVER THE COUNTER MEDICATION, Take 1 capsule by mouth daily. Ginger and tumeric daily .  pantoprazole (PROTONIX) 40 MG tablet, TAKE 1 TABLET BY  MOUTH EVERY DAY  Current Facility-Administered Medications (Other):  .  0.9 %  sodium chloride infusion   Reviewed prior external information including notes and imaging from  primary care provider As well as notes that were available from care everywhere and other healthcare systems.  Past medical history, social, surgical and family history all reviewed in electronic medical record.  No pertanent information unless stated regarding to the chief complaint.   Review of Systems:  No headache, visual changes, nausea, vomiting, diarrhea, constipation, dizziness, abdominal pain, skin rash, fevers, chills, night sweats, weight loss, swollen lymph nodes,  joint swelling, chest pain, shortness of breath, mood changes. POSITIVE muscle aches, body aches  Objective  Blood pressure 100/80, pulse 83, height 6\' 5"  (1.956 m), weight 253 lb (114.8 kg), SpO2 98 %.   General: No apparent distress alert and oriented x3 mood and affect normal, dressed appropriately.  HEENT: Pupils equal, extraocular movements intact  Respiratory: Patient's speak in full sentences and does not appear short of breath  Cardiovascular: No lower extremity edema, non tender, no erythema  Neuro: Cranial nerves II through XII are intact, neurovascularly intact in all extremities with 2+ DTRs and 2+ pulses.  Gait normal with good balance and coordination.  MSK: Left shoulder exam shows the patient does have more increase in impingement with Hawkins and Neer's.  Positive O'Brien's which is new.  Patient does have positive crossover as well.  Tender to palpation in the parascapular region.  Patient's neck does have some limited range of motion especially with extension but negative Spurling's today.  5-5 strength though of the upper extremity noted.  Patient does appear to be significantly more uncomfortable at the moment.     Impression and Recommendations:     This case required medical decision making of moderate  complexity. The above documentation has been reviewed and is accurate and complete Lyndal Pulley, DO       Note: This dictation was prepared with Dragon dictation along with smaller phrase technology. Any transcriptional errors that result from this process are unintentional.

## 2020-05-02 NOTE — Assessment & Plan Note (Addendum)
Patient pain seems to be out of proportion to the amount of palpation at this morning.  Did not want to continue with the osteopathic manipulation until we know the patient does not have a potential labral pathology.  Patient does have a positive O'Brien's which is significantly worse.  Still has pain over the acromioclavicular joint on the left side as well and known arthritic changes with a mild effusion.  After the MRI of arthrogram depending on findings we will discuss the possibility of formal physical therapy, returning to osteopathic manipulation, or possible surgical intervention.  Differential does include cervical radiculopathy secondary to patient's history of fusion but I think this is lower likelihood based on physical exam today.  Total time with patient discussing patient's pathology, prognosis, and reason why we need advanced imaging 34 minutes

## 2020-05-02 NOTE — Patient Instructions (Addendum)
Call [place] [phone] to schedule MRI Xrays today at [facility] location We will see what that shows and decide next steps

## 2020-05-04 ENCOUNTER — Encounter: Payer: Self-pay | Admitting: Internal Medicine

## 2020-05-04 ENCOUNTER — Ambulatory Visit: Payer: BC Managed Care – PPO | Admitting: Internal Medicine

## 2020-05-04 ENCOUNTER — Other Ambulatory Visit: Payer: Self-pay

## 2020-05-04 ENCOUNTER — Other Ambulatory Visit (INDEPENDENT_AMBULATORY_CARE_PROVIDER_SITE_OTHER): Payer: BC Managed Care – PPO

## 2020-05-04 VITALS — BP 104/70 | HR 79 | Temp 98.1°F | Ht 77.0 in | Wt 251.8 lb

## 2020-05-04 DIAGNOSIS — G8929 Other chronic pain: Secondary | ICD-10-CM

## 2020-05-04 DIAGNOSIS — Z125 Encounter for screening for malignant neoplasm of prostate: Secondary | ICD-10-CM | POA: Diagnosis not present

## 2020-05-04 DIAGNOSIS — Z0000 Encounter for general adult medical examination without abnormal findings: Secondary | ICD-10-CM | POA: Diagnosis not present

## 2020-05-04 DIAGNOSIS — R739 Hyperglycemia, unspecified: Secondary | ICD-10-CM

## 2020-05-04 DIAGNOSIS — M25512 Pain in left shoulder: Secondary | ICD-10-CM

## 2020-05-04 DIAGNOSIS — E890 Postprocedural hypothyroidism: Secondary | ICD-10-CM

## 2020-05-04 DIAGNOSIS — E785 Hyperlipidemia, unspecified: Secondary | ICD-10-CM | POA: Diagnosis not present

## 2020-05-04 LAB — CBC WITH DIFFERENTIAL/PLATELET
Basophils Absolute: 0.1 10*3/uL (ref 0.0–0.1)
Basophils Relative: 1.1 % (ref 0.0–3.0)
Eosinophils Absolute: 0.2 10*3/uL (ref 0.0–0.7)
Eosinophils Relative: 2.2 % (ref 0.0–5.0)
HCT: 41.3 % (ref 39.0–52.0)
Hemoglobin: 13.7 g/dL (ref 13.0–17.0)
Lymphocytes Relative: 39.8 % (ref 12.0–46.0)
Lymphs Abs: 3 10*3/uL (ref 0.7–4.0)
MCHC: 33.1 g/dL (ref 30.0–36.0)
MCV: 88.8 fl (ref 78.0–100.0)
Monocytes Absolute: 0.8 10*3/uL (ref 0.1–1.0)
Monocytes Relative: 11.2 % (ref 3.0–12.0)
Neutro Abs: 3.4 10*3/uL (ref 1.4–7.7)
Neutrophils Relative %: 45.7 % (ref 43.0–77.0)
Platelets: 141 10*3/uL — ABNORMAL LOW (ref 150.0–400.0)
RBC: 4.65 Mil/uL (ref 4.22–5.81)
RDW: 14.1 % (ref 11.5–15.5)
WBC: 7.5 10*3/uL (ref 4.0–10.5)

## 2020-05-04 MED ORDER — TRIAMCINOLONE ACETONIDE 0.1 % EX OINT
1.0000 | TOPICAL_OINTMENT | Freq: Two times a day (BID) | CUTANEOUS | 2 refills | Status: DC
Start: 2020-05-04 — End: 2021-12-05

## 2020-05-04 NOTE — Progress Notes (Signed)
Subjective:  Patient ID: Elijah Ashley, male    DOB: 1966/11/21  Age: 54 y.o. MRN: [id number]  CC: Follow-up   HPI SHAFI MORGANTE presents for shoulder pain - seeing Dr Tamala Julian, had L shoulder injection - they helped, now the pain is back. He had X rays, saw Dr Tamala Julian yesterday. C spine w/DJD, post-op changes. L shoulder MRI is pending. Meloxocam did not helped F/u dyslipidemia, hypothyroidism  Outpatient Medications Prior to Visit  Medication Sig Dispense Refill  . albuterol (VENTOLIN HFA) 108 (90 Base) MCG/ACT inhaler Inhale 2 puffs into the lungs every 6 (six) hours as needed for wheezing or shortness of breath. 18 g 1  . Cholecalciferol (VITAMIN D3) 2000 units capsule Take 1 capsule (2,000 Units total) by mouth daily. 100 capsule 3  . clonazePAM (KLONOPIN) 1 MG tablet Take 1 tablet (1 mg total) by mouth 2 (two) times daily as needed for anxiety. 180 tablet 1  . dorzolamide-timolol (COSOPT) 22.3-6.8 MG/ML ophthalmic solution Place 1 drop into both eyes 2 (two) times daily.      . fexofenadine (ALLEGRA) 180 MG tablet Take 1 tablet (180 mg total) by mouth daily. 90 tablet 3  . FLUoxetine (PROZAC) 40 MG capsule Take 1 capsule by mouth total 40 mg daily after work 90 capsule 1  . latanoprost (XALATAN) 0.005 % ophthalmic solution Place 1 drop into both eyes at bedtime. 2.5 mL 1  . levothyroxine (SYNTHROID, LEVOTHROID) 200 MCG tablet Take 175 mcg by mouth daily.     . Omega-3 Fatty Acids (FISH OIL) 1000 MG CAPS Take by mouth daily.    Marland Kitchen OVER THE COUNTER MEDICATION Take 1 capsule by mouth daily. Ginger and tumeric daily    . pantoprazole (PROTONIX) 40 MG tablet TAKE 1 TABLET BY MOUTH EVERY DAY 90 tablet 1  . predniSONE (DELTASONE) 10 MG tablet Take 2 po daily times 2 days then one po daily for one week 20 tablet 0  . atorvastatin (LIPITOR) 10 MG tablet Take 10 mg by mouth daily.    . cyclobenzaprine (FLEXERIL) 10 MG tablet TAKE 1 TABLET BY MOUTH EVERYDAY AT BEDTIME (Patient not taking:  Reported on 05/04/2020) 30 tablet 0  . Glucosamine-Chondroit-Vit C-Mn (GLUCOSAMINE 1500 COMPLEX PO) Take by mouth as needed.    Marland Kitchen levothyroxine (SYNTHROID, LEVOTHROID) 175 MCG tablet 1 TABLET EVERY MORNING ON AN EMPTY STOMACH ONCE A DAY EVERY DAY ORALLY 90 DAYS  3  . meloxicam (MOBIC) 7.5 MG tablet TAKE 1 TABLET BY MOUTH EVERY DAY (Patient not taking: Reported on 05/04/2020) 30 tablet 0  . mupirocin ointment (BACTROBAN) 2 % Use qid R nostril (Patient not taking: Reported on 05/04/2020) 15 g 0  . ondansetron (ZOFRAN) 4 MG tablet Take 1 tablet (4 mg total) by mouth every 8 (eight) hours as needed for nausea or vomiting. (Patient not taking: Reported on 05/04/2020) 20 tablet 1  . triamcinolone (NASACORT) 55 MCG/ACT AERO nasal inhaler Place 2 sprays into the nose daily. (Patient not taking: Reported on 05/04/2020) 1 Inhaler 12  . predniSONE (STERAPRED UNI-PAK 21 TAB) 10 MG (21) TBPK tablet Take as directed. 21 tablet 0   Facility-Administered Medications Prior to Visit  Medication Dose Route Frequency Provider Last Rate Last Admin  . 0.9 %  sodium chloride infusion  500 mL Intravenous Continuous Gatha Mayer, MD        ROS: Review of Systems  Constitutional: Negative for appetite change, fatigue and unexpected weight change.  HENT: Negative for congestion, nosebleeds, sneezing, sore  throat and trouble swallowing.   Eyes: Negative for itching and visual disturbance.  Respiratory: Negative for cough.   Cardiovascular: Negative for chest pain, palpitations and leg swelling.  Gastrointestinal: Negative for abdominal distention, blood in stool, diarrhea and nausea.  Genitourinary: Negative for frequency and hematuria.  Musculoskeletal: Negative for back pain, gait problem, joint swelling and neck pain.  Skin: Negative for rash.  Neurological: Negative for dizziness, tremors, speech difficulty and weakness.  Psychiatric/Behavioral: Negative for agitation, dysphoric mood, sleep disturbance and suicidal  ideas. The patient is not nervous/anxious.     Objective:  BP 104/70 (BP Location: Left Arm, Patient Position: Sitting, Cuff Size: Large)   Pulse 79   Temp 98.1 F (36.7 C) (Oral)   Ht 6\' 5"  (1.956 m)   Wt 251 lb 12.8 oz (114.2 kg)   SpO2 96%   BMI 29.86 kg/m   BP Readings from Last 3 Encounters:  05/04/20 104/70  05/02/20 100/80  03/21/20 100/78    Wt Readings from Last 3 Encounters:  05/04/20 251 lb 12.8 oz (114.2 kg)  05/02/20 253 lb (114.8 kg)  03/21/20 252 lb (114.3 kg)    Physical Exam Constitutional:      General: He is not in acute distress.    Appearance: He is well-developed.     Comments: NAD  Eyes:     Conjunctiva/sclera: Conjunctivae normal.     Pupils: Pupils are equal, round, and reactive to light.  Neck:     Thyroid: No thyromegaly.     Vascular: No JVD.  Cardiovascular:     Rate and Rhythm: Normal rate and regular rhythm.     Heart sounds: Normal heart sounds. No murmur. No friction rub. No gallop.   Pulmonary:     Effort: Pulmonary effort is normal. No respiratory distress.     Breath sounds: Normal breath sounds. No wheezing or rales.  Chest:     Chest wall: No tenderness.  Abdominal:     General: Bowel sounds are normal. There is no distension.     Palpations: Abdomen is soft. There is no mass.     Tenderness: There is no abdominal tenderness. There is no guarding or rebound.  Musculoskeletal:        General: No tenderness. Normal range of motion.     Cervical back: Normal range of motion.  Lymphadenopathy:     Cervical: No cervical adenopathy.  Skin:    General: Skin is warm and dry.     Findings: No rash.  Neurological:     Mental Status: He is alert and oriented to person, place, and time.     Cranial Nerves: No cranial nerve deficit.     Motor: No abnormal muscle tone.     Coordination: Coordination normal.     Gait: Gait normal.     Deep Tendon Reflexes: Reflexes are normal and symmetric.  Psychiatric:        Behavior: Behavior  normal.        Thought Content: Thought content normal.        Judgment: Judgment normal.     Lab Results  Component Value Date   WBC 6.6 07/22/2019   HGB 14.7 07/22/2019   HCT 44.0 07/22/2019   PLT 147.0 (L) 07/22/2019   GLUCOSE 100 (H) 07/22/2019   CHOL 249 (H) 11/16/2018   TRIG 108.0 11/16/2018   HDL 44.40 11/16/2018   LDLDIRECT 176.7 11/12/2013   LDLCALC 183 (H) 11/16/2018   ALT 21 07/22/2019   AST 17  07/22/2019   NA 135 07/22/2019   K 4.4 07/22/2019   CL 105 07/22/2019   CREATININE 0.98 07/22/2019   BUN 14 07/22/2019   CO2 25 07/22/2019   TSH 0.23 (L) 11/16/2018   PSA 0.82 11/16/2018   INR 1.0 06/14/2009    DG Cervical Spine Complete  Result Date: 05/02/2020 CLINICAL DATA:  Neck pain, shoulder pain, no injury EXAM: CERVICAL SPINE - COMPLETE 4+ VIEW COMPARISON:  04/02/2018 FINDINGS: No fracture or static subluxation of the cervical spine. Status post ACDF of C5-C6 with bony incorporation of the disc space. There is mild disc space height loss and osteophytosis at C6-C7. Disc spaces are otherwise intact. Surgical clips in the vicinity of the thyroid. The included skull base and upper chest are unremarkable. IMPRESSION: No fracture or static subluxation of the cervical spine. Status post ACDF of C5-C6 with bony incorporation of the disc space. Mild disc space height loss and osteophytosis at C6-C7. Disc spaces are otherwise intact. Electronically Signed   By: Eddie Candle M.D.   On: 05/02/2020 16:14   DG Shoulder Left  Result Date: 05/02/2020 CLINICAL DATA:  Shoulder pain.  No injury. EXAM: LEFT SHOULDER - 2+ VIEW COMPARISON:  CT chest 10/30/2011. FINDINGS: Surgical clips in the neck. Mild acromioclavicular and glenohumeral degenerative change. No acute bony or joint abnormality. No evidence of fracture, dislocation, or separation. IMPRESSION: Mild acromioclavicular and glenohumeral degenerative change. No acute abnormality. Electronically Signed   By: Marcello Moores  Register   On:  05/02/2020 17:01    Assessment & Plan:    Follow-up: No follow-ups on file.  Walker Kehr, MD

## 2020-05-04 NOTE — Patient Instructions (Addendum)
Rice sock heating pad  Voltaren gel as needed  Use Medrol dosepac if worse   These suggestions will probably help you to improve your metabolism if you are not overweight and to lose weight if you are overweight: 1.  Reduce your consumption of sugars and starches.  Eliminate high fructose corn syrup from your diet.  Reduce your consumption of processed foods.  For desserts try to have seasonal fruits, berries, nuts, cheeses or dark chocolate with more than 70% cacao. 2.  Do not snack 3.  You do not have to eat breakfast.  If you choose to have breakfast-eat plain greek yogurt, eggs, oatmeal (without sugar) 4.  Drink water, freshly brewed unsweetened tea (green, black or herbal) or coffee.  Do not drink sodas including diet sodas , juices, beverages sweetened with artificial sweeteners. 5.  Reduce your consumption of refined grains. 6.  Avoid protein drinks such as Optifast, Slim fast etc. Eat chicken, fish, meat, dairy and beans for your sources of protein 7.  Natural unprocessed fats like cold pressed virgin olive oil, butter, coconut oil are good for you.  Eat avocados 8.  Increase your consumption of fiber.  Fruits, berries, vegetables, whole grains, flaxseeds, Chia seeds, beans, popcorn, nuts, oatmeal are good sources of fiber 9.  Use vinegar in your diet, i.e. apple cider vinegar, red wine or balsamic vinegar 10.  You can try fasting.  For example you can skip breakfast and lunch every other day (24-hour fast) 11.  Stress reduction, good night sleep, relaxation, meditation, yoga and other physical activity is likely to help you to maintain low weight too. 12.  If you drink alcohol, limit your alcohol intake to no more than 2 drinks a day.

## 2020-05-05 ENCOUNTER — Encounter: Payer: Self-pay | Admitting: Family Medicine

## 2020-05-05 LAB — URINALYSIS
Bilirubin Urine: NEGATIVE
Hgb urine dipstick: NEGATIVE
Ketones, ur: NEGATIVE
Leukocytes,Ua: NEGATIVE
Nitrite: NEGATIVE
Specific Gravity, Urine: >=1.03 — AB (ref 1.000–1.030)
Total Protein, Urine: NEGATIVE
Urine Glucose: NEGATIVE
Urobilinogen, UA: 0.2 (ref 0.0–1.0)
pH: 6 (ref 5.0–8.0)

## 2020-05-05 LAB — LIPID PANEL
Cholesterol: 196 mg/dL (ref 0–200)
HDL: 48.1 mg/dL (ref >39.00)
LDL Cholesterol: 131 mg/dL — ABNORMAL HIGH (ref 0–99)
NonHDL: 147.43
Total CHOL/HDL Ratio: 4
Triglycerides: 84 mg/dL (ref 0.0–149.0)
VLDL: 16.8 mg/dL (ref 0.0–40.0)

## 2020-05-05 LAB — HEPATIC FUNCTION PANEL
ALT: 26 U/L (ref 0–53)
AST: 25 U/L (ref 0–37)
Albumin: 4.6 g/dL (ref 3.5–5.2)
Alkaline Phosphatase: 99 U/L (ref 39–117)
Bilirubin, Direct: 0.1 mg/dL (ref 0.0–0.3)
Total Bilirubin: 0.6 mg/dL (ref 0.2–1.2)
Total Protein: 7.1 g/dL (ref 6.0–8.3)

## 2020-05-05 LAB — BASIC METABOLIC PANEL
BUN: 14 mg/dL (ref 6–23)
CO2: 27 mEq/L (ref 19–32)
Calcium: 9.4 mg/dL (ref 8.4–10.5)
Chloride: 104 mEq/L (ref 96–112)
Creatinine, Ser: 1.1 mg/dL (ref 0.40–1.50)
GFR: 84.84 mL/min (ref >60.00)
Glucose, Bld: 93 mg/dL (ref 70–99)
Potassium: 4.5 mEq/L (ref 3.5–5.1)
Sodium: 136 mEq/L (ref 135–145)

## 2020-05-05 LAB — HEMOGLOBIN A1C: Hgb A1c MFr Bld: 5.2 % (ref 4.6–6.5)

## 2020-05-05 LAB — PSA: PSA: 0.84 ng/mL (ref 0.10–4.00)

## 2020-05-05 LAB — TSH: TSH: 1.4 u[IU]/mL (ref 0.35–4.50)

## 2020-05-08 ENCOUNTER — Other Ambulatory Visit: Payer: Self-pay

## 2020-05-08 ENCOUNTER — Other Ambulatory Visit: Payer: Self-pay | Admitting: Internal Medicine

## 2020-05-08 DIAGNOSIS — M5020 Other cervical disc displacement, unspecified cervical region: Secondary | ICD-10-CM

## 2020-05-08 DIAGNOSIS — G8929 Other chronic pain: Secondary | ICD-10-CM

## 2020-05-08 MED ORDER — METHYLPREDNISOLONE 4 MG PO TBPK
ORAL_TABLET | ORAL | 0 refills | Status: DC
Start: 2020-05-08 — End: 2021-04-19

## 2020-05-08 NOTE — Assessment & Plan Note (Signed)
On Levothroid 

## 2020-05-08 NOTE — Assessment & Plan Note (Addendum)
Worse Medrol pack po F/u w/Dr Tamala Julian

## 2020-05-08 NOTE — Assessment & Plan Note (Signed)
Labs

## 2020-05-19 ENCOUNTER — Encounter: Payer: Self-pay | Admitting: Family Medicine

## 2020-05-22 ENCOUNTER — Encounter: Payer: Self-pay | Admitting: Family Medicine

## 2020-05-23 ENCOUNTER — Telehealth: Payer: Self-pay | Admitting: Orthopaedic Surgery

## 2020-05-23 ENCOUNTER — Other Ambulatory Visit: Payer: Self-pay

## 2020-05-23 DIAGNOSIS — M25512 Pain in left shoulder: Secondary | ICD-10-CM

## 2020-05-23 NOTE — Telephone Encounter (Signed)
Patient called.   He is wanting to see if his MRI can be reviewed by Lorin Mercy even though he didn't order it or see him for this area yet.   Call back: [phone]

## 2020-05-24 NOTE — Telephone Encounter (Signed)
I called patient and advised Dr. Lorin Mercy does not have access to images as they were done at a [facility] facility. I did explain that he would need to get disc and that Dr. Lorin Mercy would probably prefer him come in for an office visit to review scan with him. He is going to work on getting CD and call back to schedule appointment.

## 2020-05-31 ENCOUNTER — Ambulatory Visit (INDEPENDENT_AMBULATORY_CARE_PROVIDER_SITE_OTHER): Payer: BC Managed Care – PPO | Admitting: Orthopaedic Surgery

## 2020-05-31 ENCOUNTER — Other Ambulatory Visit: Payer: BC Managed Care – PPO

## 2020-05-31 ENCOUNTER — Encounter: Payer: Self-pay | Admitting: Orthopaedic Surgery

## 2020-05-31 VITALS — BP 109/68 | HR 68 | Ht 77.0 in | Wt 251.0 lb

## 2020-05-31 DIAGNOSIS — Z981 Arthrodesis status: Secondary | ICD-10-CM

## 2020-05-31 MED ORDER — CYCLOBENZAPRINE HCL 10 MG PO TABS: 10.0000 mg | ORAL_TABLET | Freq: Every day | ORAL | 0 refills | Status: AC

## 2020-06-01 NOTE — Progress Notes (Signed)
Office Visit Note   Patient: Elijah Ashley           Date of Birth: [birth year]           MRN: [id number] Visit Date: 05/31/2020              Requested by: Cassandria Anger, MD [place],  [place] [address] PCP: Plotnikov, Evie Lacks, MD   Assessment & Plan: Visit Diagnoses:  1. S/P cervical spinal fusion     Plan: Very concerned I see the disc and reviewed the MRI of the cervical spine done at [facility] recently.Marland Kitchen  He can obtain the disc drop it off I will be happy to review it and call him.  I discussed with him that if all other levels are normal and there is nothing needs to be done to cervical spine.  The lobe that was fused to solid and he can proceed with the shoulder treatment as recommended by Dr. Tamera Punt.  Follow-Up Instructions: Return if symptoms worsen or fail to improve.   Orders:  No orders of the defined types were placed in this encounter.  Meds ordered this encounter  Medications   cyclobenzaprine (FLEXERIL) 10 MG tablet    Sig: Take 1 tablet (10 mg total) by mouth at bedtime.    Dispense:  20 tablet    Refill:  0      Procedures: No procedures performed   Clinical Data: No additional findings.   Subjective: Chief Complaint  Patient presents with   Neck - Pain    HPI 54 year old male returns have not seen him in 2 years.  He had an MRI scan ordered of his neck.  I did previous surgery at C5-6 and 2008 and has been having some pain on the left shoulder with activities.  He sees Dr. 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  He is concerned that he signed a release but they did not send the disc here for review.  Report shows satisfactory decompression at the C5-6 level and all other levels in the cervical spine were interpreted as normal.  Patient is anxious and is very concerned that we cannot compare those.  I was able to pull his MRI  images before the surgery and a year after the surgery.  He did have trace disc bulge at C4-5 previously but this was interpreted as normal and his most recent scan done at [facility].  Review of Systems updated.  Positive for history of bipolar depression left shoulder pain as listed above.  Previous C5-6 fusion, solid.   Objective: Vital Signs: BP 109/68    Pulse 68    Ht 6\' 5"  (1.956 m)    Wt 251 lb (113.9 kg)    BMI 29.76 kg/m   Physical Exam Constitutional:      Appearance: He is well-developed.  HENT:     Head: Normocephalic and atraumatic.  Eyes:     Pupils: Pupils are equal, round, and reactive to light.  Neck:     Thyroid: No thyromegaly.     Trachea: No tracheal deviation.  Cardiovascular:     Rate and Rhythm: Normal rate.  Pulmonary:     Effort: Pulmonary effort is normal.     Breath sounds: No wheezing.  Abdominal:     General: Bowel sounds are normal.     Palpations: Abdomen is soft.  Skin:    General:  Skin is warm and dry.     Capillary Refill: Capillary refill takes less than 2 seconds.  Neurological:     Mental Status: He is alert and oriented to person, place, and time.  Psychiatric:        Behavior: Behavior normal.        Thought Content: Thought content normal.        Judgment: Judgment normal.     Ortho Exam patient has some pain with shoulder range of motion.  No brachial plexus tenderness upper extremity reflexes are 2+ and symmetrical.  Specialty Comments:  No specialty comments available.  Imaging: No results found.   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/18/2018   Protrusion of cervical intervertebral disc 06/30/2018   S/P cervical spinal fusion 05/11/2018   Ingrowing hair 01/02/2018   Elevated LFTs 11/20/2017   Urinary tract infection 10/14/2017   Fever and chills 10/14/2017   Paresthesia 10/14/2017   Hx of adenomatous polyp of colon 10/02/2017   Family history of colon cancer - brother 65's and mother 45's 09/24/2017   Bipolar depression ([place]) 03/20/2017   Overweight (BMI 25.0-29.9) 03/20/2017   Family history of colitis 03/20/2017   Patellar tendinitis 08/04/2015   Anxiety    Glaucoma    URINARY RETENTION 09/17/2010   Hypothyroidism 09/12/2010   LOW BACK PAIN 09/12/2010   Past Medical History:  Diagnosis Date   Allergy    Anxiety    Depression    GERD (gastroesophageal reflux disease)    past hx- some now that comes and goes   Glaucoma (increased eye pressure)    Hemorrhoids    pt states he gets regular flares with these    Hx of adenomatous polyp of colon 10/02/2017   Hyperlipidemia    slightly elevated- working on diet and exercise, no meds    HYPOTHYROIDISM    postsurgical   LOW BACK PAIN    L4-5 degen disc on MRI - s/p ESI 09/2010   Papillary thyroid carcinoma ([place]) 06/16/09 dx   total thyroidectomy for cold nodule & Graves disease   Psoriasis    URINARY RETENTION     Family History  Problem Relation Age of Onset   Breast cancer Other    Cervical cancer Other    Colon cancer Other    Hypertension Other    Ulcerative colitis Sister    Diabetes Father    Pancreatic cancer Sister 66       ? colon involvement   Colon cancer Brother 18       50's   Colon cancer Mother 62       70's   Rectal cancer Neg Hx    Stomach cancer Neg Hx     Past Surgical History:  Procedure Laterality Date   CERVICAL  LAMINECTOMY  07/2007   C5-6 ACDF due to myelopathy , C3-6 per report    TOTAL THYROIDECTOMY  03/2009   graves and cold nodule (papillary ca)  Social History   Occupational History   Occupation: Advice worker: [employer]  Tobacco Use   Smoking status: Never Smoker   Smokeless tobacco: Never Used  Scientific laboratory technician Use: Former  Substance and Sexual Activity   Alcohol use: Yes    Comment: socially only    Drug use: No   Sexual activity: Yes

## 2020-06-12 ENCOUNTER — Other Ambulatory Visit: Payer: BC Managed Care – PPO

## 2020-06-29 ENCOUNTER — Other Ambulatory Visit: Payer: Self-pay

## 2020-06-29 MED ORDER — FEXOFENADINE HCL 180 MG PO TABS: 180.0000 mg | ORAL_TABLET | Freq: Every day | ORAL | 3 refills | Status: DC

## 2020-07-04 ENCOUNTER — Ambulatory Visit: Payer: BC Managed Care – PPO | Admitting: Internal Medicine

## 2020-07-10 ENCOUNTER — Other Ambulatory Visit: Payer: Self-pay | Admitting: Internal Medicine

## 2020-10-02 ENCOUNTER — Other Ambulatory Visit: Payer: Self-pay | Admitting: Family Medicine

## 2020-10-05 ENCOUNTER — Encounter: Payer: Self-pay | Admitting: Psychiatry

## 2020-10-27 ENCOUNTER — Ambulatory Visit: Payer: BC Managed Care – PPO

## 2020-11-02 ENCOUNTER — Telehealth: Payer: Self-pay | Admitting: Psychiatry

## 2020-11-02 ENCOUNTER — Other Ambulatory Visit: Payer: Self-pay

## 2020-11-02 DIAGNOSIS — F330 Major depressive disorder, recurrent, mild: Secondary | ICD-10-CM

## 2020-11-02 DIAGNOSIS — F411 Generalized anxiety disorder: Secondary | ICD-10-CM

## 2020-11-02 MED ORDER — FLUOXETINE HCL 40 MG PO CAPS
ORAL_CAPSULE | ORAL | 0 refills | Status: DC
Start: 2020-11-02 — End: 2020-11-06

## 2020-11-02 NOTE — Telephone Encounter (Signed)
[facility]ac [facility], patient does have apt 11/22 with Dr. Creig Hines

## 2020-11-02 NOTE — Telephone Encounter (Signed)
Pt called requesting refill for Fluoxetine @ [facility] [place]. Scheduled follow up for 11/22.

## 2020-11-05 ENCOUNTER — Other Ambulatory Visit: Payer: Self-pay | Admitting: Family Medicine

## 2020-11-06 ENCOUNTER — Ambulatory Visit (INDEPENDENT_AMBULATORY_CARE_PROVIDER_SITE_OTHER): Payer: BC Managed Care – PPO | Admitting: Psychiatry

## 2020-11-06 ENCOUNTER — Other Ambulatory Visit: Payer: Self-pay

## 2020-11-06 ENCOUNTER — Encounter: Payer: Self-pay | Admitting: Psychiatry

## 2020-11-06 VITALS — Ht 77.0 in | Wt 254.0 lb

## 2020-11-06 DIAGNOSIS — F330 Major depressive disorder, recurrent, mild: Secondary | ICD-10-CM

## 2020-11-06 DIAGNOSIS — F411 Generalized anxiety disorder: Secondary | ICD-10-CM | POA: Diagnosis not present

## 2020-11-06 MED ORDER — CLONAZEPAM 1 MG PO TABS: 1.0000 mg | ORAL_TABLET | Freq: Two times a day (BID) | ORAL | 1 refills | Status: DC | PRN

## 2020-11-06 MED ORDER — FLUOXETINE HCL 40 MG PO CAPS: ORAL_CAPSULE | ORAL | 1 refills | Status: DC

## 2020-11-06 NOTE — Progress Notes (Signed)
[facility]  Patient ID: DELIA SITAR,  MRN: [id number]  PCP: Cassandria Anger, MD  Date of Evaluation: 11/06/2020 Time spent:25 minutes from 1125 to 1150  Chief Complaint:  Chief Complaint    Anxiety; Depression; Family Problem      HISTORY/CURRENT STATUS: Telly is seen onsite in office 25 minutes face-to-face individually with consent with epic collateral for psychiatric interview and exam in 17-month evaluation and management of generalized anxiety and recurrent major depression.  At last appointment, the patient brought wife for the first time to review patient's and her son's medical and mental issues along with multiple generations needing their time and resources.  Patient continues Klonopin using 2 daily most days.  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 Wife's son [name] has reestablished contact and possibly residence with his birth father though grandmother keeps up with him particularly as he has no job and still remains on the [employer] plan and payment..  Wife in the interim has cerebral vasculitis but anaphylaxis from her Cytoxin treatment so that her treatment is in jeopardy.  [place] [facility] notes last Klonopin dispensing 08/04/2020 as a 90-day supply with no inappropriate use in the last year to contraindicate medically necessary treatment.  Case closure for my imminent retirement is completed patient to transfer to provider in this office at his request.  He has no mania, suicidality, psychosis or delirium.  Depression The patient presents withrecurrentdepression most recent episode onset 11 months ago. The onset qualitywassudden. Thesymptoms are intermittent. The problem has been waxing and waningsince onset.Associated symptoms include decreased concentration,apathetic sadness, boredom, excessive worry, nervous anxious behavior, restlessness,body achesand  indigestion. Associated symptoms include no insomnia,helplessness,hopelessness, appetite change,mania, psychosis, or suicidal ideas.The symptoms are aggravated by work stress, medication, social issues and family issues.Past treatments include SSRIs - Selective serotonin reuptake inhibitors and other medications.Compliance with treatment is variable.Past compliance problems include difficulty understanding directions, difficulty with treatment plan, medication issues and medical issues.Previous treatment provided moderaterelief.Risk factors include family history, a change in medication usage/dosage, history of mental illness, major life event and stress. Past medical history includes anxiety,depressionand mental health disorder. Pertinent negatives include no chronic fatigue syndrome,no hypothyroidism,no chronic illness,no life-threatening condition,no physical disability,no recent psychiatric admission,no brain trauma,no obsessive-compulsive disorder,no post-traumatic stress disorder,no schizophrenia,no suicide attemptsand no head trauma.  Individual Medical History/ Review of Systems: Changes? :Yes weight is up 4 pounds in 7 months.  Allergies: Patient has no known allergies.  Current Medications:  Current Outpatient Medications:  .  albuterol (VENTOLIN HFA) 108 (90 Base) MCG/ACT inhaler, Inhale 2 puffs into the lungs every 6 (six) hours as needed for wheezing or shortness of breath., Disp: 18 g, Rfl: 1 .  atorvastatin (LIPITOR) 10 MG tablet, Take 10 mg by mouth daily., Disp: , Rfl:  .  Cholecalciferol (VITAMIN D3) 2000 units capsule, Take 1 capsule (2,000 Units total) by mouth daily., Disp: 100 capsule, Rfl: 3 .  clonazePAM (KLONOPIN) 1 MG tablet, Take 1 tablet (1 mg total) by mouth 2 (two) times daily as needed for anxiety., Disp: 180 tablet, Rfl: 1 .  cyclobenzaprine (FLEXERIL) 10 MG tablet, TAKE 1 TABLET BY MOUTH EVERYDAY AT BEDTIME (Patient not taking:  Reported on 05/04/2020), Disp: 30 tablet, Rfl: 0 .  cyclobenzaprine (FLEXERIL) 10 MG tablet, Take 1 tablet (10 mg total) by mouth at bedtime., Disp: 20 tablet, Rfl: 0 .  dorzolamide-timolol (COSOPT) 22.3-6.8 MG/ML ophthalmic solution, Place 1 drop into both eyes  2 (two) times daily.  , Disp: , Rfl:  .  fexofenadine (ALLEGRA) 180 MG tablet, Take 1 tablet (180 mg total) by mouth daily., Disp: 90 tablet, Rfl: 3 .  FLUoxetine (PROZAC) 40 MG capsule, Take 1 capsule by mouth total 40 mg daily after work, Disp: 90 capsule, Rfl: 1 .  Glucosamine-Chondroit-Vit C-Mn (GLUCOSAMINE 1500 COMPLEX PO), Take by mouth as needed., Disp: , Rfl:  .  latanoprost (XALATAN) 0.005 % ophthalmic solution, Place 1 drop into both eyes at bedtime., Disp: 2.5 mL, Rfl: 1 .  levothyroxine (SYNTHROID, LEVOTHROID) 175 MCG tablet, 1 TABLET EVERY MORNING ON AN EMPTY STOMACH ONCE A DAY EVERY DAY ORALLY 90 DAYS, Disp: , Rfl: 3 .  levothyroxine (SYNTHROID, LEVOTHROID) 200 MCG tablet, Take 175 mcg by mouth daily. , Disp: , Rfl:  .  meloxicam (MOBIC) 7.5 MG tablet, TAKE 1 TABLET BY MOUTH EVERY DAY, Disp: 30 tablet, Rfl: 0 .  methylPREDNISolone (MEDROL DOSEPAK) 4 MG TBPK tablet, As directed, Disp: 21 tablet, Rfl: 0 .  mupirocin ointment (BACTROBAN) 2 %, Use qid R nostril (Patient not taking: Reported on 05/04/2020), Disp: 15 g, Rfl: 0 .  Omega-3 Fatty Acids (FISH OIL) 1000 MG CAPS, Take by mouth daily., Disp: , Rfl:  .  ondansetron (ZOFRAN) 4 MG tablet, Take 1 tablet (4 mg total) by mouth every 8 (eight) hours as needed for nausea or vomiting. (Patient not taking: Reported on 05/04/2020), Disp: 20 tablet, Rfl: 1 .  OVER THE COUNTER MEDICATION, Take 1 capsule by mouth daily. Ginger and tumeric daily, Disp: , Rfl:  .  pantoprazole (PROTONIX) 40 MG tablet, TAKE 1 TABLET BY MOUTH EVERY DAY, Disp: 90 tablet, Rfl: 1 .  triamcinolone (NASACORT) 55 MCG/ACT AERO nasal inhaler, Place 2 sprays into the nose daily. (Patient not taking: Reported on  05/04/2020), Disp: 1 Inhaler, Rfl: 12 .  triamcinolone ointment (KENALOG) 0.1 %, Apply 1 application topically 2 (two) times daily., Disp: 80 g, Rfl: 2  Current Facility-Administered Medications:  .  0.9 %  sodium chloride infusion, 500 mL, Intravenous, Continuous, Carlean Purl Ofilia Neas, MD  Medication Side Effects: none  Family Medical/ Social History: Changes? No recently noting me has bipolar disorder, mother died of dementia, and there are relatives with character and developmental delay disorders  MENTAL HEALTH EXAM:  Height 6\' 5"  (1.956 m), weight 254 lb (115.2 kg).Body mass index is 30.12 kg/m. Muscle strengths and tone 5/5, postural reflexes and gait 0/0, and AIMS = 0.  General Appearance: Casual, Fairly Groomed and Meticulous  Eye Contact:  Good  Speech:  Clear and Coherent, Slow and Talkative  Volume:  Normal  Mood:  Anxious, Dysphoric and Euthymic  Affect:  Congruent, Inappropriate, Full Range and Anxious  Thought Process:  Coherent, Goal Directed, Irrelevant and Descriptions of Associations: Circumstantial  Orientation:  Full (Time, Place, and Person)  Thought Content: Obsessions and Rumination   Suicidal Thoughts:  No  Homicidal Thoughts:  No  Memory:  Immediate;   Good Remote;   Good  Judgement:  Fair  Insight:  Fair  Psychomotor Activity:  Normal and Mannerisms  Concentration:  Concentration: Fair and Attention Span: Good  Recall:  Good  Fund of Knowledge: Good  Language: Fair  Assets:  Desire for Improvement Intimacy Leisure Time Resilience Vocational/Educational  ADL's:  Intact  Cognition: WNL  Prognosis:  Fair    DIAGNOSES:    ICD-10-CM   1. Generalized anxiety disorder  F41.1 clonazePAM (KLONOPIN) 1 MG tablet    FLUoxetine (PROZAC) 40  MG capsule  2. Mild recurrent major depression (HCC)  F33.0 FLUoxetine (PROZAC) 40 MG capsule    Receiving Psychotherapy: No    RECOMMENDATIONS: Psychosupportive psychoeducation reviews the last 3 years of treatment for  adjustments according to patient's clarification of cause-and-effect problems so that he is now taking the Prozac at 40 mg and  Klonopin 1 mg twice daily as needed. Despite his complexity of problems himself and his attempt to support other relatives through their problems, the patient may be doing better than most.  Still he does not want to hear that.  Is brief therapy with Lina Sayre has ended as wife's son quickly disengaged.  Prozac 40 mg every morning is E scribed #90 with [facility] [address] for generalized anxiety and major depression.  He is E scribed Klonopin 1 mg twice daily as needed sent as #180 with 1 refill to [place] [facility] for generalized anxiety.  Prevention and monitoring safety hygiene particularly for his medications on the job and crisis plans if needed are outlined.  The patient transfer transitions to Thayer Headings, PMHNP to be seen in follow up again in 6 months or sooner if needed.  Delight Hoh, MD

## 2020-12-04 ENCOUNTER — Other Ambulatory Visit: Payer: Self-pay | Admitting: Family Medicine

## 2020-12-26 ENCOUNTER — Other Ambulatory Visit: Payer: Self-pay | Admitting: Family Medicine

## 2021-01-04 ENCOUNTER — Telehealth (INDEPENDENT_AMBULATORY_CARE_PROVIDER_SITE_OTHER): Payer: BC Managed Care – PPO | Admitting: Internal Medicine

## 2021-01-04 ENCOUNTER — Encounter: Payer: Self-pay | Admitting: *Deleted

## 2021-01-04 DIAGNOSIS — U071 COVID-19: Secondary | ICD-10-CM | POA: Diagnosis not present

## 2021-01-04 DIAGNOSIS — K121 Other forms of stomatitis: Secondary | ICD-10-CM | POA: Diagnosis not present

## 2021-01-04 DIAGNOSIS — K1230 Oral mucositis (ulcerative), unspecified: Secondary | ICD-10-CM | POA: Diagnosis not present

## 2021-01-04 MED ORDER — MAGIC MOUTHWASH: 5.0000 mL | Freq: Four times a day (QID) | ORAL | 1 refills | Status: DC

## 2021-01-04 MED ORDER — AZITHROMYCIN 250 MG PO TABS: ORAL_TABLET | ORAL | 0 refills | Status: DC

## 2021-01-04 NOTE — Progress Notes (Signed)
Virtual Visit via Telephone Note  I connected with Elijah Ashley on 01/04/21 at  8:30 AM EST by telephone and verified that I am speaking with the correct person using two identifiers.  Location: Patient: home Provider: [facility]   I discussed the limitations, risks, security and privacy concerns of performing an evaluation and management service by telephone and the availability of in person appointments. I also discussed with the patient that there may be a patient responsible charge related to this service. The patient expressed understanding and agreed to proceed.   History of Present Illness:   Elijah Ashley was diagnosed with Covid a few days ago.  She got better, however, he has productive cough, nasal congestion, sore throat.  His gums are inflamed as well as his mouth. Observations/Objective:  The patient sounds normal, but congested on the phone Assessment and Plan:  See plan Follow Up Instructions:    I discussed the assessment and treatment plan with the patient. The patient was provided an opportunity to ask questions and all were answered. The patient agreed with the plan and demonstrated an understanding of the instructions.   The patient was advised to call back or seek an in-person evaluation if the symptoms worsen or if the condition fails to improve as anticipated.  I provided 13 minutes of non-face-to-face time during this encounter.   Elijah Kehr, MD

## 2021-01-08 DIAGNOSIS — U071 COVID-19: Secondary | ICD-10-CM | POA: Insufficient documentation

## 2021-01-08 DIAGNOSIS — K121 Other forms of stomatitis: Secondary | ICD-10-CM | POA: Insufficient documentation

## 2021-01-08 NOTE — Assessment & Plan Note (Signed)
Post Covid bronchitis.  Prescribed Z-Pak.  Over-the-counter meds

## 2021-01-08 NOTE — Assessment & Plan Note (Signed)
post Covid.  Magic mouthwash

## 2021-01-30 ENCOUNTER — Other Ambulatory Visit: Payer: Self-pay | Admitting: Family Medicine

## 2021-02-22 ENCOUNTER — Other Ambulatory Visit: Payer: Self-pay | Admitting: Internal Medicine

## 2021-02-22 NOTE — Telephone Encounter (Signed)
Ok to pcp 

## 2021-02-23 ENCOUNTER — Other Ambulatory Visit: Payer: Self-pay | Admitting: Internal Medicine

## 2021-03-08 ENCOUNTER — Other Ambulatory Visit: Payer: Self-pay | Admitting: Internal Medicine

## 2021-03-19 ENCOUNTER — Ambulatory Visit
Admission: RE | Admit: 2021-03-19 | Discharge: 2021-03-19 | Disposition: A | Discharge status: Home / Self Care | Payer: BC Managed Care – PPO | Source: Ambulatory Visit | Attending: Internal Medicine | Admitting: Internal Medicine

## 2021-03-19 ENCOUNTER — Other Ambulatory Visit: Payer: Self-pay | Admitting: Internal Medicine

## 2021-03-19 DIAGNOSIS — R062 Wheezing: Secondary | ICD-10-CM

## 2021-03-19 DIAGNOSIS — R071 Chest pain on breathing: Secondary | ICD-10-CM

## 2021-04-18 ENCOUNTER — Other Ambulatory Visit: Payer: Self-pay

## 2021-04-19 ENCOUNTER — Encounter: Payer: Self-pay | Admitting: Internal Medicine

## 2021-04-19 ENCOUNTER — Ambulatory Visit: Payer: BC Managed Care – PPO | Admitting: Internal Medicine

## 2021-04-19 DIAGNOSIS — G4700 Insomnia, unspecified: Secondary | ICD-10-CM

## 2021-04-19 DIAGNOSIS — E663 Overweight: Secondary | ICD-10-CM

## 2021-04-19 DIAGNOSIS — M545 Low back pain, unspecified: Secondary | ICD-10-CM

## 2021-04-19 DIAGNOSIS — G8929 Other chronic pain: Secondary | ICD-10-CM

## 2021-04-19 DIAGNOSIS — M2559 Pain in other specified joint: Secondary | ICD-10-CM | POA: Diagnosis not present

## 2021-04-19 DIAGNOSIS — M2550 Pain in unspecified joint: Secondary | ICD-10-CM | POA: Insufficient documentation

## 2021-04-19 MED ORDER — MELOXICAM 15 MG PO TABS: 15.0000 mg | ORAL_TABLET | Freq: Every day | ORAL | 1 refills | Status: DC

## 2021-04-19 NOTE — Assessment & Plan Note (Signed)
Wt Readings from Last 3 Encounters:  04/19/21 252 lb 9.6 oz (114.6 kg)  05/31/20 251 lb (113.9 kg)  05/04/20 251 lb 12.8 oz (114.2 kg)

## 2021-04-19 NOTE — Patient Instructions (Addendum)

## 2021-04-19 NOTE — Assessment & Plan Note (Signed)
MSK pains Pt was out of Lipitor x 1 mo, no improvement in arthralgias Meloxicam prn Vit D

## 2021-04-19 NOTE — Assessment & Plan Note (Signed)
Pt denied OSA

## 2021-04-19 NOTE — Progress Notes (Signed)
Subjective:  Patient ID: Elijah Ashley, male    DOB: 05-Jul-1966  Age: 55 y.o. MRN: [id number]  CC: joint pain and Fatigue (Also c/o of weight)   HPI [name] presents for fatigue, arthralgias, dyslipidemia, hypothyroidism. Seeing Dr Buddy Duty Pt is out of Lipitor x 1 mo, no improvement in arthralgias C/o HAs  Outpatient Medications Prior to Visit  Medication Sig Dispense Refill  . albuterol (VENTOLIN HFA) 108 (90 Base) MCG/ACT inhaler TAKE 2 PUFFS BY MOUTH EVERY 6 HOURS AS NEEDED FOR WHEEZE OR SHORTNESS OF BREATH 18 each 5  . atorvastatin (LIPITOR) 10 MG tablet Take 10 mg by mouth every other day.    . Cholecalciferol (VITAMIN D3) 2000 units capsule Take 1 capsule (2,000 Units total) by mouth daily. 100 capsule 3  . clonazePAM (KLONOPIN) 1 MG tablet Take 1 tablet (1 mg total) by mouth 2 (two) times daily as needed for anxiety. 180 tablet 1  . cyclobenzaprine (FLEXERIL) 10 MG tablet Take 1 tablet (10 mg total) by mouth at bedtime. 20 tablet 0  . dorzolamide-timolol (COSOPT) 22.3-6.8 MG/ML ophthalmic solution Place 1 drop into both eyes 2 (two) times daily.    . fexofenadine (ALLEGRA) 180 MG tablet Take 1 tablet (180 mg total) by mouth daily. 90 tablet 3  . FLUoxetine (PROZAC) 40 MG capsule Take 1 capsule by mouth total 40 mg daily after work 90 capsule 1  . Glucosamine-Chondroit-Vit C-Mn (GLUCOSAMINE 1500 COMPLEX PO) Take by mouth as needed.    . latanoprost (XALATAN) 0.005 % ophthalmic solution Place 1 drop into both eyes at bedtime. 2.5 mL 1  . levothyroxine (SYNTHROID, LEVOTHROID) 175 MCG tablet 1 TABLET EVERY MORNING ON AN EMPTY STOMACH ONCE A DAY EVERY DAY ORALLY 90 DAYS  3  . meloxicam (MOBIC) 7.5 MG tablet TAKE 1 TABLET BY MOUTH EVERY DAY 30 tablet 0  . OVER THE COUNTER MEDICATION Take 1 capsule by mouth daily. Ginger and tumeric daily    . pantoprazole (PROTONIX) 40 MG tablet TAKE 1 TABLET BY MOUTH EVERY DAY 90 tablet 1  . triamcinolone ointment (KENALOG) 0.1 % Apply 1  application topically 2 (two) times daily. 80 g 2  . azithromycin (ZITHROMAX Z-PAK) 250 MG tablet As directed (Patient not taking: Reported on 04/19/2021) 6 tablet 0  . cyclobenzaprine (FLEXERIL) 10 MG tablet TAKE 1 TABLET BY MOUTH EVERYDAY AT BEDTIME (Patient not taking: Reported on 05/04/2020) 30 tablet 0  . cyclobenzaprine (FLEXERIL) 5 MG tablet TAKE 1 TABLET BY MOUTH EVERY DAY AS NEEDED (Patient not taking: Reported on 04/19/2021) 20 tablet 1  . levothyroxine (SYNTHROID, LEVOTHROID) 200 MCG tablet Take 175 mcg by mouth daily.     . magic mouthwash SOLN Take 5 mLs by mouth 4 (four) times daily. (Patient not taking: Reported on 04/19/2021) 200 mL 1  . methylPREDNISolone (MEDROL DOSEPAK) 4 MG TBPK tablet As directed (Patient not taking: Reported on 04/19/2021) 21 tablet 0  . mupirocin ointment (BACTROBAN) 2 % Use qid R nostril (Patient not taking: No sig reported) 15 g 0  . Omega-3 Fatty Acids (FISH OIL) 1000 MG CAPS Take by mouth daily. (Patient not taking: Reported on 04/19/2021)    . ondansetron (ZOFRAN) 4 MG tablet Take 1 tablet (4 mg total) by mouth every 8 (eight) hours as needed for nausea or vomiting. (Patient not taking: No sig reported) 20 tablet 1  . triamcinolone (NASACORT) 55 MCG/ACT AERO nasal inhaler Place 2 sprays into the nose daily. (Patient not taking: Reported on 05/04/2020) 1  Inhaler 12  . 0.9 %  sodium chloride infusion      No facility-administered medications prior to visit.    ROS: Review of Systems  Constitutional: Positive for fatigue. Negative for appetite change and unexpected weight change.  HENT: Negative for congestion, nosebleeds, sneezing, sore throat and trouble swallowing.   Eyes: Negative for itching and visual disturbance.  Respiratory: Negative for cough.   Cardiovascular: Negative for chest pain, palpitations and leg swelling.  Gastrointestinal: Negative for abdominal distention, blood in stool, diarrhea and nausea.  Genitourinary: Negative for frequency and  hematuria.  Musculoskeletal: Positive for arthralgias, back pain and neck stiffness. Negative for gait problem, joint swelling, myalgias and neck pain.  Skin: Negative for rash.  Neurological: Negative for dizziness, tremors, speech difficulty and weakness.  Psychiatric/Behavioral: Negative for agitation, dysphoric mood and sleep disturbance. The patient is not nervous/anxious.     Objective:  BP 110/72 (BP Location: Left Arm)   Pulse 82   Temp 98.1 F (36.7 C) (Oral)   Ht 6\' 5"  (1.956 m)   Wt 252 lb 9.6 oz (114.6 kg)   SpO2 97%   BMI 29.95 kg/m   BP Readings from Last 3 Encounters:  04/19/21 110/72  05/31/20 109/68  05/04/20 104/70    Wt Readings from Last 3 Encounters:  04/19/21 252 lb 9.6 oz (114.6 kg)  05/31/20 251 lb (113.9 kg)  05/04/20 251 lb 12.8 oz (114.2 kg)    Physical Exam Constitutional:      General: He is not in acute distress.    Appearance: He is well-developed.     Comments: NAD  Eyes:     Conjunctiva/sclera: Conjunctivae normal.     Pupils: Pupils are equal, round, and reactive to light.  Neck:     Thyroid: No thyromegaly.     Vascular: No JVD.  Cardiovascular:     Rate and Rhythm: Normal rate and regular rhythm.     Heart sounds: Normal heart sounds. No murmur heard. No friction rub. No gallop.   Pulmonary:     Effort: Pulmonary effort is normal. No respiratory distress.     Breath sounds: Normal breath sounds. No wheezing or rales.  Chest:     Chest wall: No tenderness.  Abdominal:     General: Bowel sounds are normal. There is no distension.     Palpations: Abdomen is soft. There is no mass.     Tenderness: There is no abdominal tenderness. There is no guarding or rebound.  Musculoskeletal:        General: Tenderness present. Normal range of motion.     Cervical back: Normal range of motion.  Lymphadenopathy:     Cervical: No cervical adenopathy.  Skin:    General: Skin is warm and dry.     Findings: No rash.  Neurological:      Mental Status: He is alert and oriented to person, place, and time.     Cranial Nerves: No cranial nerve deficit.     Motor: No abnormal muscle tone.     Coordination: Coordination normal.     Gait: Gait normal.     Deep Tendon Reflexes: Reflexes are normal and symmetric.  Psychiatric:        Behavior: Behavior normal.        Thought Content: Thought content normal.        Judgment: Judgment normal.     Lab Results  Component Value Date   WBC 7.5 05/04/2020   HGB 13.7 05/04/2020  HCT 41.3 05/04/2020   PLT 141.0 (L) 05/04/2020   GLUCOSE 93 05/04/2020   CHOL 196 05/04/2020   TRIG 84.0 05/04/2020   HDL 48.10 05/04/2020   LDLDIRECT 176.7 11/12/2013   LDLCALC 131 (H) 05/04/2020   ALT 26 05/04/2020   AST 25 05/04/2020   NA 136 05/04/2020   K 4.5 05/04/2020   CL 104 05/04/2020   CREATININE 1.10 05/04/2020   BUN 14 05/04/2020   CO2 27 05/04/2020   TSH 1.40 05/04/2020   PSA 0.84 05/04/2020   INR 1.0 06/14/2009   HGBA1C 5.2 05/04/2020    DG Chest 2 View  Result Date: 03/19/2021 CLINICAL DATA:  55 year old male with a history of chest pain and wheezing EXAM: CHEST - 2 VIEW COMPARISON:  CT 10/30/2011, plain film 06/14/2009 FINDINGS: Cardiomediastinal silhouette within normal limits in size and contour. No evidence of central vascular congestion. No interlobular septal thickening. No pneumothorax pleural effusion or confluent airspace disease. No acute displaced fracture. Surgical changes of the cervical region incompletely imaged IMPRESSION: Negative for acute cardiopulmonary disease Electronically Signed   By: Corrie Mckusick D.O.   On: 03/19/2021 11:58    Assessment & Plan:    Walker Kehr, MD

## 2021-04-19 NOTE — Assessment & Plan Note (Signed)
Pt is out of Lipitor x 1 mo, no improvement in arthralgias

## 2021-04-20 LAB — COMPREHENSIVE METABOLIC PANEL
ALT: 21 U/L (ref 0–53)
AST: 20 U/L (ref 0–37)
Albumin: 4.4 g/dL (ref 3.5–5.2)
Alkaline Phosphatase: 134 U/L — ABNORMAL HIGH (ref 39–117)
BUN: 12 mg/dL (ref 6–23)
CO2: 26 mEq/L (ref 19–32)
Calcium: 9.2 mg/dL (ref 8.4–10.5)
Chloride: 104 mEq/L (ref 96–112)
Creatinine, Ser: 0.99 mg/dL (ref 0.40–1.50)
GFR: 86.9 mL/min (ref >60.00)
Glucose, Bld: 80 mg/dL (ref 70–99)
Potassium: 3.8 mEq/L (ref 3.5–5.1)
Sodium: 138 mEq/L (ref 135–145)
Total Bilirubin: 0.4 mg/dL (ref 0.2–1.2)
Total Protein: 7.6 g/dL (ref 6.0–8.3)

## 2021-04-20 LAB — VITAMIN D 25 HYDROXY (VIT D DEFICIENCY, FRACTURES): VITD: 37.11 ng/mL (ref 30.00–100.00)

## 2021-04-20 LAB — CBC WITH DIFFERENTIAL/PLATELET
Basophils Absolute: 0 10*3/uL (ref 0.0–0.1)
Basophils Relative: 0.6 % (ref 0.0–3.0)
Eosinophils Absolute: 0.2 10*3/uL (ref 0.0–0.7)
Eosinophils Relative: 2.2 % (ref 0.0–5.0)
HCT: 41.1 % (ref 39.0–52.0)
Hemoglobin: 13.4 g/dL (ref 13.0–17.0)
Lymphocytes Relative: 43.4 % (ref 12.0–46.0)
Lymphs Abs: 3.2 10*3/uL (ref 0.7–4.0)
MCHC: 32.6 g/dL (ref 30.0–36.0)
MCV: 85.7 fl (ref 78.0–100.0)
Monocytes Absolute: 0.9 10*3/uL (ref 0.1–1.0)
Monocytes Relative: 11.7 % (ref 3.0–12.0)
Neutro Abs: 3.1 10*3/uL (ref 1.4–7.7)
Neutrophils Relative %: 42.1 % — ABNORMAL LOW (ref 43.0–77.0)
Platelets: 145 10*3/uL — ABNORMAL LOW (ref 150.0–400.0)
RBC: 4.8 Mil/uL (ref 4.22–5.81)
RDW: 13.9 % (ref 11.5–15.5)
WBC: 7.4 10*3/uL (ref 4.0–10.5)

## 2021-04-20 LAB — VITAMIN B12: Vitamin B-12: 522 pg/mL (ref 211–911)

## 2021-04-25 ENCOUNTER — Other Ambulatory Visit: Payer: Self-pay | Admitting: Internal Medicine

## 2021-04-25 DIAGNOSIS — E785 Hyperlipidemia, unspecified: Secondary | ICD-10-CM

## 2021-05-07 ENCOUNTER — Other Ambulatory Visit: Payer: Self-pay

## 2021-05-07 ENCOUNTER — Encounter: Payer: Self-pay | Admitting: Psychiatry

## 2021-05-07 ENCOUNTER — Ambulatory Visit (INDEPENDENT_AMBULATORY_CARE_PROVIDER_SITE_OTHER): Payer: BC Managed Care – PPO | Admitting: Psychiatry

## 2021-05-07 DIAGNOSIS — F330 Major depressive disorder, recurrent, mild: Secondary | ICD-10-CM | POA: Diagnosis not present

## 2021-05-07 DIAGNOSIS — F411 Generalized anxiety disorder: Secondary | ICD-10-CM

## 2021-05-07 MED ORDER — FLUOXETINE HCL 40 MG PO CAPS: ORAL_CAPSULE | ORAL | 1 refills | Status: DC

## 2021-05-07 MED ORDER — CLONAZEPAM 1 MG PO TABS
1.0000 mg | ORAL_TABLET | Freq: Two times a day (BID) | ORAL | 1 refills | Status: DC | PRN
Start: 2021-07-03 — End: 2021-10-29

## 2021-05-07 NOTE — Progress Notes (Signed)
[name] [id number] 25-Apr-1966 55 y.o.  Subjective:   Patient ID:  Elijah Ashley is a 55 y.o. (DOB 07/13/1966) male.  Chief Complaint:  Chief Complaint  Patient presents with  . Follow-up    H/o Anxiety and depression    HPI [name] presents to the office today for follow-up of anxiety and depression. Pt previously seen by Dr. Creig Hines and care is being transferred to this provider due to Dr. Creig Hines' retirement.   He reports, "I'm ok, for the most part." He reports that he has had some stressors and is feeling some better recently. He reports that he has not had severe depression- "just a little grey."He reports mood is "pretty even keel." He reports in the past he has more anger. He denies excessive anxiety and worry. He reports that he is "trying to let go... not focusing on what I cannot change." He reports difficulty falling asleep at times." He falls asleep faster after working nights and sleep has improved with black out shades. He reports some restless sleep when he does not take Klonopin and wife will notice this. Stretching and rubbing his legs also helps relieve restlessness. He reports that appetite has been good and he has been trying to lose weight. He reports that his energy has been good. Motivation has been good. Concentration is adequate. Denies SI.   Older brother had a heart attack and is now back in [place]. He reports that he wants to take care of family. Wife has rare autoimmune disorder and is on prednisone and methotrexate.   He reports that he is doing meditation and self-care. He is exercising several days a week.    He reports taking Klonopin prn before bed and an additional tab as needed.   Thornton Dales (55 yo), is in [place] with mother's aunt. He reports that they had tension in their relationship in the past. He reports that his relationship with [name] has improved. Recently took [place] his car. He has a 8 yo daughter. He reports  that her grades are good and she is in all AP classes. 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 He will listen to podcasts and music.   Past Psychiatric Medication Trials: Prozac Klonopin Nuvigil- Took when he initially started working nights Wellbutrin XL Depakote Latuda- "felt like a zombie" and shuffling gait Trazodone Ambien  PHQ2-9   [facility] Visit from 03/20/2017 in [place]  PHQ-2 Total Score 0       Review of Systems:  Review of Systems  Musculoskeletal: Negative for gait problem.       Occ arthralgias with inactivity  Neurological:       Occ mild HA. Some restless legs  Psychiatric/Behavioral:       Please refer to HPI    Medications: I have reviewed the patient's current medications.  Current Outpatient Medications  Medication Sig Dispense Refill  . albuterol (VENTOLIN HFA) 108 (90 Base) MCG/ACT inhaler TAKE 2 PUFFS BY MOUTH EVERY 6 HOURS AS NEEDED FOR WHEEZE OR SHORTNESS OF BREATH 18 each 5  . Cholecalciferol (VITAMIN D3) 2000 units capsule Take 1 capsule (2,000 Units total) by mouth daily. 100 capsule 3  . cyclobenzaprine (FLEXERIL) 10 MG tablet Take 1 tablet (10 mg total) by mouth at bedtime. 20 tablet 0  . dorzolamide-timolol (COSOPT) 22.3-6.8 MG/ML ophthalmic solution Place 1 drop into both eyes 2 (two) times daily.    . fexofenadine (ALLEGRA) 180  MG tablet Take 1 tablet (180 mg total) by mouth daily. 90 tablet 3  . latanoprost (XALATAN) 0.005 % ophthalmic solution Place 1 drop into both eyes at bedtime. 2.5 mL 1  . levothyroxine (SYNTHROID, LEVOTHROID) 175 MCG tablet 1 TABLET EVERY MORNING ON AN EMPTY STOMACH ONCE A DAY EVERY DAY ORALLY 90 DAYS  3  . meloxicam (MOBIC) 15 MG tablet Take 1 tablet (15 mg total) by mouth daily. 90 tablet 1  . Multiple Vitamins-Minerals (IMMUNE SUPPORT PO) Take by mouth.    . pantoprazole (PROTONIX) 40 MG tablet TAKE 1 TABLET BY MOUTH EVERY DAY 90 tablet  1  . atorvastatin (LIPITOR) 10 MG tablet Take 10 mg by mouth every other day. (Patient not taking: Reported on 05/07/2021)    . [START ON 07/03/2021] clonazePAM (KLONOPIN) 1 MG tablet Take 1 tablet (1 mg total) by mouth 2 (two) times daily as needed for anxiety. 180 tablet 1  . FLUoxetine (PROZAC) 40 MG capsule Take 1 capsule by mouth total 40 mg daily after work 90 capsule 1  . OVER THE COUNTER MEDICATION Take 1 capsule by mouth daily. Ginger and tumeric daily    . triamcinolone ointment (KENALOG) 0.1 % Apply 1 application topically 2 (two) times daily. 80 g 2   No current facility-administered medications for this visit.    Medication Side Effects: None  Allergies: No Known Allergies  Past Medical History:  Diagnosis Date  . Allergy   . Anxiety   . Depression   . GERD (gastroesophageal reflux disease)    past hx- some now that comes and goes  . Glaucoma (increased eye pressure)   . Hemorrhoids    pt states he gets regular flares with these   . Hx of adenomatous polyp of colon 10/02/2017  . Hyperlipidemia    slightly elevated- working on diet and exercise, no meds   . HYPOTHYROIDISM    postsurgical  . LOW BACK PAIN    L4-5 degen disc on MRI - s/p ESI 09/2010  . 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 Psoriasis   . URINARY RETENTION     Past Medical History, Surgical history, Social history, and Family history were reviewed and updated as appropriate.   Please see review of systems for further details on the patient's review from today.   Objective:   Physical Exam:  Wt 255 lb (115.7 kg)   BMI 30.24 kg/m   Physical Exam Constitutional:      General: He is not in acute distress. Musculoskeletal:        General: No deformity.  Neurological:     Mental Status: He is alert and oriented to person, place, and time.     Coordination: Coordination normal.  Psychiatric:        Attention and Perception:  Attention and perception normal. He does not perceive auditory or visual hallucinations.        Mood and Affect: Mood normal. Mood is not anxious or depressed. Affect is not labile, blunt, angry or inappropriate.        Speech: Speech normal.        Behavior: Behavior normal.        Thought Content: Thought content normal. Thought content is not paranoid or delusional. Thought content does not include homicidal or suicidal ideation. Thought content does not include homicidal or suicidal plan.        Cognition and Memory: Cognition and memory normal.  Judgment: Judgment normal.     Comments: Insight intact     Lab Review:     Component Value Date/Time   NA 138 04/20/2021 1315   K 3.8 04/20/2021 1315   CL 104 04/20/2021 1315   CO2 26 04/20/2021 1315   GLUCOSE 80 04/20/2021 1315   BUN 12 04/20/2021 1315   CREATININE 0.99 04/20/2021 1315   CALCIUM 9.2 04/20/2021 1315   PROT 7.6 04/20/2021 1315   ALBUMIN 4.4 04/20/2021 1315   AST 20 04/20/2021 1315   ALT 21 04/20/2021 1315   ALKPHOS 134 (H) 04/20/2021 1315   BILITOT 0.4 04/20/2021 1315   GFRNONAA >60 09/22/2015 2215   GFRAA >60 09/22/2015 2215  .     Component Value Date/Time   WBC 7.4 04/20/2021 1315   RBC 4.80 04/20/2021 1315   HGB 13.4 04/20/2021 1315   HCT 41.1 04/20/2021 1315   PLT 145.0 (L) 04/20/2021 1315   MCV 85.7 04/20/2021 1315   MCH 30.2 09/22/2015 2215   MCHC 32.6 04/20/2021 1315   RDW 13.9 04/20/2021 1315   LYMPHSABS 3.2 04/20/2021 1315   MONOABS 0.9 04/20/2021 1315   EOSABS 0.2 04/20/2021 1315   BASOSABS 0.0 04/20/2021 1315    No results found for: POCLITH, LITHIUM   No results found for: PHENYTOIN, PHENOBARB, VALPROATE, CBMZ   .res Assessment: Plan:   Patient seen for 30 minutes and time spent reviewing history and discussing long-term risks and side effects of medication. Discussed continuing Prozac 40 mg daily since he reports that mood has been stable and anxiety has been well  controlled. Continue Klonopin 1 mg twice daily as needed for anxiety or insomnia. Patient to follow-up in 6 months or sooner if clinically indicated. Patient advised to contact office with any questions, adverse effects, or acute worsening in signs and symptoms.   Amery was seen today for follow-up.  Diagnoses and all orders for this visit:  Generalized anxiety disorder -     clonazePAM (KLONOPIN) 1 MG tablet; Take 1 tablet (1 mg total) by mouth 2 (two) times daily as needed for anxiety. -     FLUoxetine (PROZAC) 40 MG capsule; Take 1 capsule by mouth total 40 mg daily after work  Mild recurrent major depression (HCC) -     FLUoxetine (PROZAC) 40 MG capsule; Take 1 capsule by mouth total 40 mg daily after work     Please see After Visit Summary for patient specific instructions.  Future Appointments  Date Time Provider [facility]  10/29/2021 10:30 AM Thayer Headings, PMHNP [facility] None    No orders of the defined types were placed in this encounter.   -------------------------------

## 2021-05-16 ENCOUNTER — Encounter: Payer: Self-pay | Admitting: Internal Medicine

## 2021-09-08 ENCOUNTER — Other Ambulatory Visit: Payer: Self-pay | Admitting: Internal Medicine

## 2021-09-10 ENCOUNTER — Other Ambulatory Visit: Payer: Self-pay | Admitting: Internal Medicine

## 2021-09-24 ENCOUNTER — Other Ambulatory Visit: Payer: Self-pay

## 2021-09-24 ENCOUNTER — Ambulatory Visit (INDEPENDENT_AMBULATORY_CARE_PROVIDER_SITE_OTHER)
Admission: RE | Admit: 2021-09-24 | Discharge: 2021-09-24 | Disposition: A | Discharge status: Home / Self Care | Payer: Self-pay | Source: Ambulatory Visit | Attending: Internal Medicine | Admitting: Internal Medicine

## 2021-09-24 DIAGNOSIS — E785 Hyperlipidemia, unspecified: Secondary | ICD-10-CM

## 2021-09-25 ENCOUNTER — Ambulatory Visit (INDEPENDENT_AMBULATORY_CARE_PROVIDER_SITE_OTHER): Payer: BC Managed Care – PPO | Admitting: Internal Medicine

## 2021-09-25 ENCOUNTER — Encounter: Payer: Self-pay | Admitting: Internal Medicine

## 2021-09-25 VITALS — BP 120/72 | HR 85 | Temp 98.3°F | Ht 77.0 in | Wt 251.6 lb

## 2021-09-25 DIAGNOSIS — E890 Postprocedural hypothyroidism: Secondary | ICD-10-CM | POA: Diagnosis not present

## 2021-09-25 DIAGNOSIS — Z23 Encounter for immunization: Secondary | ICD-10-CM | POA: Diagnosis not present

## 2021-09-25 DIAGNOSIS — F419 Anxiety disorder, unspecified: Secondary | ICD-10-CM | POA: Diagnosis not present

## 2021-09-25 DIAGNOSIS — R490 Dysphonia: Secondary | ICD-10-CM

## 2021-09-25 DIAGNOSIS — F330 Major depressive disorder, recurrent, mild: Secondary | ICD-10-CM | POA: Diagnosis not present

## 2021-09-25 DIAGNOSIS — E785 Hyperlipidemia, unspecified: Secondary | ICD-10-CM | POA: Diagnosis not present

## 2021-09-25 MED ORDER — LORATADINE 10 MG PO TABS: 10.0000 mg | ORAL_TABLET | Freq: Every day | ORAL | 3 refills | Status: AC

## 2021-09-25 NOTE — Assessment & Plan Note (Signed)
On Fluoxetine  

## 2021-09-25 NOTE — Assessment & Plan Note (Signed)
Cont on Fluoxetine, Klonopin  Trazodone d/c

## 2021-09-25 NOTE — Progress Notes (Signed)
Subjective:  Patient ID: Elijah Ashley, male    DOB: 1966-11-05  Age: 55 y.o. MRN: [id number]  CC: Follow-up (Flu shot)   HPI Elijah Ashley presents for hypothyroidism, anxiety, dyslipidemia f/u  Outpatient Medications Prior to Visit  Medication Sig Dispense Refill   albuterol (VENTOLIN HFA) 108 (90 Base) MCG/ACT inhaler TAKE 2 PUFFS BY MOUTH EVERY 6 HOURS AS NEEDED FOR WHEEZE OR SHORTNESS OF BREATH 18 each 5   Cholecalciferol (VITAMIN D3) 2000 units capsule Take 1 capsule (2,000 Units total) by mouth daily. 100 capsule 3   clonazePAM (KLONOPIN) 1 MG tablet Take 1 tablet (1 mg total) by mouth 2 (two) times daily as needed for anxiety. 180 tablet 1   cyclobenzaprine (FLEXERIL) 10 MG tablet Take 1 tablet (10 mg total) by mouth at bedtime. 20 tablet 0   dorzolamide-timolol (COSOPT) 22.3-6.8 MG/ML ophthalmic solution Place 1 drop into both eyes 2 (two) times daily.     FLUoxetine (PROZAC) 40 MG capsule Take 1 capsule by mouth total 40 mg daily after work 90 capsule 1   latanoprost (XALATAN) 0.005 % ophthalmic solution Place 1 drop into both eyes at bedtime. 2.5 mL 1   meloxicam (MOBIC) 15 MG tablet Take 1 tablet (15 mg total) by mouth daily. 90 tablet 1   Multiple Vitamins-Minerals (IMMUNE SUPPORT PO) Take by mouth.     OVER THE COUNTER MEDICATION Take 1 capsule by mouth daily. Ginger and tumeric daily     pantoprazole (PROTONIX) 40 MG tablet TAKE 1 TABLET BY MOUTH EVERY DAY 90 tablet 1   triamcinolone ointment (KENALOG) 0.1 % Apply 1 application topically 2 (two) times daily. 80 g 2   fexofenadine (ALLEGRA) 180 MG tablet TAKE 1 TABLET BY MOUTH EVERY DAY 90 tablet 3   levothyroxine (SYNTHROID) 150 MCG tablet Take 150 mcg by mouth every morning. Take 1 by mouth every morning     atorvastatin (LIPITOR) 10 MG tablet Take 10 mg by mouth every other day. (Patient not taking: Reported on 05/07/2021)     cyclobenzaprine (FLEXERIL) 5 MG tablet TAKE 1 TABLET BY MOUTH EVERY DAY AS NEEDED  (Patient not taking: Reported on 09/25/2021) 20 tablet 1   levothyroxine (SYNTHROID, LEVOTHROID) 175 MCG tablet 1 TABLET EVERY MORNING ON AN EMPTY STOMACH ONCE A DAY EVERY DAY ORALLY 90 DAYS (Patient not taking: Reported on 09/25/2021)  3   No facility-administered medications prior to visit.    ROS: Review of Systems  Constitutional:  Negative for appetite change, fatigue and unexpected weight change.  HENT:  Negative for congestion, nosebleeds, sneezing, sore throat and trouble swallowing.   Eyes:  Negative for itching and visual disturbance.  Respiratory:  Negative for cough.   Cardiovascular:  Negative for chest pain, palpitations and leg swelling.  Gastrointestinal:  Negative for abdominal distention, blood in stool, diarrhea and nausea.  Genitourinary:  Negative for frequency and hematuria.  Musculoskeletal:  Positive for arthralgias. Negative for back pain, gait problem, joint swelling and neck pain.  Skin:  Negative for rash.  Neurological:  Negative for dizziness, tremors, speech difficulty and weakness.  Psychiatric/Behavioral:  Negative for agitation, dysphoric mood, sleep disturbance and suicidal ideas. The patient is not nervous/anxious.    Objective:  BP 120/72 (BP Location: Left Arm)   Pulse 85   Temp 98.3 F (36.8 C) (Oral)   Ht 6\' 5"  (1.956 m)   Wt 251 lb 9.6 oz (114.1 kg)   SpO2 95%   BMI 29.84 kg/m   BP Readings  from Last 3 Encounters:  09/25/21 120/72  04/19/21 110/72  05/31/20 109/68    Wt Readings from Last 3 Encounters:  09/25/21 251 lb 9.6 oz (114.1 kg)  04/19/21 252 lb 9.6 oz (114.6 kg)  05/31/20 251 lb (113.9 kg)    Physical Exam Constitutional:      General: He is not in acute distress.    Appearance: Normal appearance. He is well-developed.     Comments: NAD  Eyes:     Conjunctiva/sclera: Conjunctivae normal.     Pupils: Pupils are equal, round, and reactive to light.  Neck:     Thyroid: No thyromegaly.     Vascular: No JVD.   Cardiovascular:     Rate and Rhythm: Normal rate and regular rhythm.     Heart sounds: Normal heart sounds. No murmur heard.   No friction rub. No gallop.  Pulmonary:     Effort: Pulmonary effort is normal. No respiratory distress.     Breath sounds: Normal breath sounds. No wheezing or rales.  Chest:     Chest wall: No tenderness.  Abdominal:     General: Bowel sounds are normal. There is no distension.     Palpations: Abdomen is soft. There is no mass.     Tenderness: There is no abdominal tenderness. There is no guarding or rebound.  Musculoskeletal:        General: No tenderness. Normal range of motion.     Cervical back: Normal range of motion.  Lymphadenopathy:     Cervical: No cervical adenopathy.  Skin:    General: Skin is warm and dry.     Findings: No rash.  Neurological:     Mental Status: He is alert and oriented to person, place, and time.     Cranial Nerves: No cranial nerve deficit.     Motor: No abnormal muscle tone.     Coordination: Coordination normal.     Gait: Gait normal.     Deep Tendon Reflexes: Reflexes are normal and symmetric.  Psychiatric:        Behavior: Behavior normal.        Thought Content: Thought content normal.        Judgment: Judgment normal.    Lab Results  Component Value Date   WBC 7.4 04/20/2021   HGB 13.4 04/20/2021   HCT 41.1 04/20/2021   PLT 145.0 (L) 04/20/2021   GLUCOSE 80 04/20/2021   CHOL 196 05/04/2020   TRIG 84.0 05/04/2020   HDL 48.10 05/04/2020   LDLDIRECT 176.7 11/12/2013   LDLCALC 131 (H) 05/04/2020   ALT 21 04/20/2021   AST 20 04/20/2021   NA 138 04/20/2021   K 3.8 04/20/2021   CL 104 04/20/2021   CREATININE 0.99 04/20/2021   BUN 12 04/20/2021   CO2 26 04/20/2021   TSH 1.40 05/04/2020   PSA 0.84 05/04/2020   INR 1.0 06/14/2009   HGBA1C 5.2 05/04/2020    CT CARDIAC SCORING (SELF PAY ONLY)  Addendum Date: 09/24/2021   ADDENDUM REPORT: 09/24/2021 12:30 EXAM: OVER-READ INTERPRETATION  CT CHEST The  following report is an over-read performed by radiologist Dr. Rebekah Chesterfield [facility] on 09/24/2021. This over-read does not include interpretation of cardiac or coronary anatomy or pathology. The coronary calcium score interpretation by the cardiologist is attached. COMPARISON:  Chest CTA 10/30/2011 FINDINGS: Within the visualized portions of the thorax there are no suspicious appearing pulmonary nodules or masses, there is no acute consolidative airspace disease, no pleural effusions, no  pneumothorax and no lymphadenopathy. Visualized portions of the upper abdomen are unremarkable. There are no aggressive appearing lytic or blastic lesions noted in the visualized portions of the skeleton. IMPRESSION: 1. No significant incidental noncardiac findings are noted. Electronically Signed   By: Vinnie Langton M.D.   On: 09/24/2021 12:30   Result Date: 09/24/2021 CLINICAL DATA:  Cardiovascular Disease Risk stratification EXAM: Coronary Calcium Score TECHNIQUE: A gated, non-contrast computed tomography scan of the heart was performed using 3mm slice thickness. Axial images were analyzed on a dedicated workstation. Calcium scoring of the coronary arteries was performed using the Agatston method. FINDINGS: Coronary arteries: Normal origins. Coronary Calcium Score: Left main: 0 Left anterior descending artery: 0 Left circumflex artery: 0 Right coronary artery: 0 Total: 0 Percentile: 0 Pericardium: Normal. Ascending Aorta: Normal caliber. Non-cardiac: See separate report from [facility]. IMPRESSION: Coronary calcium score of 0. This was 0 percentile for age-, race-, and sex-matched controls. RECOMMENDATIONS: Coronary artery calcium (CAC) score is a strong predictor of incident coronary heart disease (CHD) and provides predictive information beyond traditional risk factors. CAC scoring is reasonable to use in the decision to withhold, postpone, or initiate statin therapy in intermediate-risk or  selected borderline-risk asymptomatic adults (age 54-75 years and LDL-C >=70 to <190 mg/dL) who do not have diabetes or established atherosclerotic cardiovascular disease (ASCVD).* In intermediate-risk (10-year ASCVD risk >=7.5% to <20%) adults or selected borderline-risk (10-year ASCVD risk >=5% to <7.5%) adults in whom a CAC score is measured for the purpose of making a treatment decision the following recommendations have been made: If CAC=0, it is reasonable to withhold statin therapy and reassess in 5 to 10 years, as long as higher risk conditions are absent (diabetes mellitus, family history of premature CHD in first degree relatives (males <55 years; females <65 years), cigarette smoking, or LDL >=190 mg/dL). If CAC is 1 to 99, it is reasonable to initiate statin therapy for patients >=76 years of age. If CAC is >=100 or >=75th percentile, it is reasonable to initiate statin therapy at any age. Cardiology referral should be considered for patients with CAC scores >=400 or >=75th percentile. *2018 AHA/ACC/AACVPR/AAPA/ABC/ACPM/ADA/AGS/APhA/ASPC/NLA/PCNA Guideline on the Management of Blood Cholesterol: A Report of the American College of Cardiology/American Heart Association Task Force on Clinical Practice Guidelines. J Am Coll Cardiol. 2019;73(24):3168-3209. Fransico Him, MD Electronically Signed: By: Fransico Him M.D. On: 09/24/2021 09:44    Assessment & Plan:   Problem List Items Addressed This Visit     Anxiety    Cont on Fluoxetine, Klonopin  Trazodone d/c      Dyslipidemia    CT calcium score is 0      Hoarseness    Recurrent F/u w/ENT [place] po      Relevant Orders   Ambulatory referral to ENT   Hypothyroidism - Primary    Pt had labs. Levothyroxine dose was reduced      Relevant Medications   levothyroxine (SYNTHROID) 150 MCG tablet   Mild recurrent major depression ([place])    On Fluoxetine      Other Visit Diagnoses     Needs flu shot             Follow-up:  Return in about 6 months (around 03/26/2022) for Wellness Exam.  Walker Kehr, MD

## 2021-09-25 NOTE — Assessment & Plan Note (Signed)
Pt had labs. Levothyroxine dose was reduced

## 2021-09-25 NOTE — Assessment & Plan Note (Signed)
Recurrent F/u w/ENT Cl[place]in po

## 2021-09-25 NOTE — Assessment & Plan Note (Signed)
CT calcium score is 0

## 2021-10-04 ENCOUNTER — Telehealth: Payer: Self-pay | Admitting: Psychiatry

## 2021-10-04 NOTE — Telephone Encounter (Signed)
Please print the following on letterhead:  To Whom It May Concern:  Mr. Elijah Ashley (DOB:14-Sep-2066) is being prescribed Klonopin and Prozac. He has taken these medications long-term without any known adverse effects. 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   Please contact our office with any questions.    Sincerely,     Thayer Headings, PMH-NP.

## 2021-10-04 NOTE — Telephone Encounter (Signed)
Letter is printed and in Jessica's box to sign.

## 2021-10-04 NOTE — Telephone Encounter (Signed)
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 He will pick the letter up once completed. His phone number is [phone].

## 2021-10-05 NOTE — Telephone Encounter (Signed)
Called Pt to pick up letter.

## 2021-10-07 ENCOUNTER — Encounter: Payer: Self-pay | Admitting: Internal Medicine

## 2021-10-12 ENCOUNTER — Other Ambulatory Visit: Payer: Self-pay | Admitting: Internal Medicine

## 2021-10-29 ENCOUNTER — Encounter: Payer: Self-pay | Admitting: Psychiatry

## 2021-10-29 ENCOUNTER — Ambulatory Visit (INDEPENDENT_AMBULATORY_CARE_PROVIDER_SITE_OTHER): Payer: BC Managed Care – PPO | Admitting: Psychiatry

## 2021-10-29 ENCOUNTER — Other Ambulatory Visit: Payer: Self-pay

## 2021-10-29 DIAGNOSIS — F330 Major depressive disorder, recurrent, mild: Secondary | ICD-10-CM | POA: Diagnosis not present

## 2021-10-29 DIAGNOSIS — F411 Generalized anxiety disorder: Secondary | ICD-10-CM | POA: Diagnosis not present

## 2021-10-29 MED ORDER — FLUOXETINE HCL 40 MG PO CAPS: ORAL_CAPSULE | ORAL | 1 refills | Status: DC

## 2021-10-29 MED ORDER — CLONAZEPAM 1 MG PO TABS: 1.0000 mg | ORAL_TABLET | Freq: Two times a day (BID) | ORAL | 1 refills | Status: DC | PRN

## 2021-10-29 NOTE — Progress Notes (Signed)
Elijah Ashley [id number] 1966-09-21 55 y.o.  Subjective:   Patient ID:  Elijah Ashley is a 55 y.o. (DOB April 15, 1966) male.  Chief Complaint:  Chief Complaint  Patient presents with   Follow-up    H/o anxiety and depression    HPI Elijah Ashley presents to the office today for follow-up of anxiety and depression. He reports that his energy has been lower for the last few weeks. He reports that he ran out of some supplements. He reports that most of the time anxiety is manageable and he will "take a break from it." He notices anxiety is more challenging when he is tired. He reports Klonopin is helpful for sleep initiation. Taking Klonopin typically only for sleep initiation. Reports best rest is the first 3-4 hours of sleep. Unable to sleep 8 consecutive hours. Reports not using Klonopin BID unless it is the weekend. He reports that he will occasionally miss a few days of Klonopin and may get headaches when he misses several consecutive doses. He reports some recent weight gain. Denies sad mood- "I don't think I am as happy as I could be." "I spend more time being worried than being happy." Denies rumination. Denies SI.   He reports that he is trying to cope with some stressors. He reports that he tries to take care of others in his family and at times at the expense of his own needs. He is trying to be intentional about doing things he enjoys.   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   Son (5 yo) is now back home. He continues to work on their relationship. Daughter is 1 yo. Wife has autoimmune condition and chronic health issues and works from home.   He works nights. Work has been going ok.   Has seen therapists in the past.   Past Psychiatric Medication Trials: Prozac Klonopin Nuvigil- Took when he initially started working nights Wellbutrin XL Depakote Latuda- "felt like a zombie" and shuffling gait Trazodone Ambien  PHQ2-9    [place] Visit  from 03/20/2017 in [place]  PHQ-2 Total Score 0        Review of Systems:  Review of Systems  HENT:  Positive for voice change.   Eyes:        He denies any change in vision.  Respiratory:  Positive for wheezing.   Musculoskeletal:  Negative for gait problem.  Neurological:  Positive for headaches. Negative for tremors.  Psychiatric/Behavioral:         Please refer to HPI   Medications: I have reviewed the patient's current medications.  Current Outpatient Medications  Medication Sig Dispense Refill   albuterol (VENTOLIN HFA) 108 (90 Base) MCG/ACT inhaler TAKE 2 PUFFS BY MOUTH EVERY 6 HOURS AS NEEDED FOR WHEEZE OR SHORTNESS OF BREATH 18 each 5   Cholecalciferol (VITAMIN D3) 2000 units capsule Take 1 capsule (2,000 Units total) by mouth daily. 100 capsule 3   Cyanocobalamin (VITAMIN B 12 PO) Take by mouth.     cyclobenzaprine (FLEXERIL) 10 MG tablet Take 1 tablet (10 mg total) by mouth at bedtime. (Patient taking differently: Take 10 mg by mouth at bedtime. Takes as needed) 20 tablet 0   dorzolamide-timolol (COSOPT) 22.3-6.8 MG/ML ophthalmic solution Place 1 drop into both eyes 2 (two) times daily.     latanoprost (XALATAN) 0.005 % ophthalmic solution Place 1 drop into both eyes at bedtime. 2.5 mL 1   pantoprazole (PROTONIX) 40 MG tablet TAKE  1 TABLET BY MOUTH EVERY DAY 90 tablet 1   [START ON 01/22/2022] clonazePAM (KLONOPIN) 1 MG tablet Take 1 tablet (1 mg total) by mouth 2 (two) times daily as needed for anxiety. 180 tablet 1   FLUoxetine (PROZAC) 40 MG capsule Take 1 capsule by mouth total 40 mg daily after work 90 capsule 1   levothyroxine (SYNTHROID) 150 MCG tablet Take 150 mcg by mouth every morning. Take 1 by mouth every morning     loratadine (CLARITIN) 10 MG tablet Take 1 tablet (10 mg total) by mouth daily. 100 tablet 3   meloxicam (MOBIC) 15 MG tablet TAKE 1 TABLET (15 MG TOTAL) BY MOUTH DAILY. (Patient not taking: Reported on 10/29/2021) 90 tablet  1   Multiple Vitamins-Minerals (IMMUNE SUPPORT PO) Take by mouth.     OVER THE COUNTER MEDICATION Take 1 capsule by mouth daily. Ginger and tumeric daily (Patient not taking: Reported on 10/29/2021)     triamcinolone ointment (KENALOG) 0.1 % Apply 1 application topically 2 (two) times daily. 80 g 2   No current facility-administered medications for this visit.    Medication Side Effects: None  Allergies: No Known Allergies  Past Medical History:  Diagnosis Date   Allergy    Anxiety    Depression    GERD (gastroesophageal reflux disease)    past hx- some now that comes and goes   Glaucoma (increased eye pressure)    Hemorrhoids    pt states he gets regular flares with these    Hx of adenomatous polyp of colon 10/02/2017   Hyperlipidemia    slightly elevated- working on diet and exercise, no meds    HYPOTHYROIDISM    postsurgical   LOW BACK PAIN    L4-5 degen disc on MRI - s/p ESI 09/2010   Papillary thyroid carcinoma ([place]) 06/16/09 dx   total thyroidectomy for cold nodule & Graves disease   Psoriasis    URINARY RETENTION     Past Medical History, Surgical history, Social history, and Family history were reviewed and updated as appropriate.   Please see review of systems for further details on the patient's review from today.   Objective:   Physical Exam:  There were no vitals taken for this visit.  Physical Exam Constitutional:      General: He is not in acute distress. Musculoskeletal:        General: No deformity.  Neurological:     Mental Status: He is alert and oriented to person, place, and time.     Coordination: Coordination normal.  Psychiatric:        Attention and Perception: Attention and perception normal. He does not perceive auditory or visual hallucinations.        Mood and Affect: Mood is anxious. Mood is not depressed. Affect is not labile, blunt, angry or inappropriate.        Speech: Speech normal.        Behavior: Behavior normal.         Thought Content: Thought content normal. Thought content is not paranoid or delusional. Thought content does not include homicidal or suicidal ideation. Thought content does not include homicidal or suicidal plan.        Cognition and Memory: Cognition and memory normal.        Judgment: Judgment normal.     Comments: Insight intact    Lab Review:     Component Value Date/Time   NA 138 04/20/2021 1315   K 3.8 04/20/2021 1315  CL 104 04/20/2021 1315   CO2 26 04/20/2021 1315   GLUCOSE 80 04/20/2021 1315   BUN 12 04/20/2021 1315   CREATININE 0.99 04/20/2021 1315   CALCIUM 9.2 04/20/2021 1315   PROT 7.6 04/20/2021 1315   ALBUMIN 4.4 04/20/2021 1315   AST 20 04/20/2021 1315   ALT 21 04/20/2021 1315   ALKPHOS 134 (H) 04/20/2021 1315   BILITOT 0.4 04/20/2021 1315   GFRNONAA >60 09/22/2015 2215   GFRAA >60 09/22/2015 2215       Component Value Date/Time   WBC 7.4 04/20/2021 1315   RBC 4.80 04/20/2021 1315   HGB 13.4 04/20/2021 1315   HCT 41.1 04/20/2021 1315   PLT 145.0 (L) 04/20/2021 1315   MCV 85.7 04/20/2021 1315   MCH 30.2 09/22/2015 2215   MCHC 32.6 04/20/2021 1315   RDW 13.9 04/20/2021 1315   LYMPHSABS 3.2 04/20/2021 1315   MONOABS 0.9 04/20/2021 1315   EOSABS 0.2 04/20/2021 1315   BASOSABS 0.0 04/20/2021 1315    No results found for: POCLITH, LITHIUM   No results found for: PHENYTOIN, PHENOBARB, VALPROATE, CBMZ   .res Assessment: Plan:    Pt seen for 30 minutes and time spent discussing potential benefits of therapy and pt agrees that therapy may be helpful. Referral made to Lanetta Inch, [facility].  Discussed long-term goal of attempting to reduce Klonopin gradually. Discussed not decreasing Klonopin at this time due to some recent anxiety and difficulty resting. Continue Klonopin 1 mg po BID prn anxiety.  Continue Prozac 40 mg po qd for anxiety and depression.  Pt to follow-up in 6 months or sooner if clinically indicated.  Patient advised to contact office with  any questions, adverse effects, or acute worsening in signs and symptoms.   Elijah Ashley was seen today for follow-up.  Diagnoses and all orders for this visit:  Generalized anxiety disorder -     clonazePAM (KLONOPIN) 1 MG tablet; Take 1 tablet (1 mg total) by mouth 2 (two) times daily as needed for anxiety. -     FLUoxetine (PROZAC) 40 MG capsule; Take 1 capsule by mouth total 40 mg daily after work  Mild recurrent major depression ([facility]) -     FLUoxetine (PROZAC) 40 MG capsule; Take 1 capsule by mouth total 40 mg daily after work    Please see After Visit Summary for patient specific instructions.  Future Appointments  Date Time Provider [place]  12/20/2021 10:00 AM [place], [facility] None  03/27/2022 10:00 AM Plotnikov, Evie Lacks, MD [facility] None  04/29/2022 10:00 AM Thayer Headings, PMHNP CP[facility]CP None    No orders of the defined types were placed in this encounter.   -------------------------------

## 2021-12-05 ENCOUNTER — Other Ambulatory Visit: Payer: Self-pay

## 2021-12-05 ENCOUNTER — Encounter (HOSPITAL_BASED_OUTPATIENT_CLINIC_OR_DEPARTMENT_OTHER): Payer: Self-pay

## 2021-12-05 ENCOUNTER — Emergency Department (HOSPITAL_BASED_OUTPATIENT_CLINIC_OR_DEPARTMENT_OTHER)
Admission: EM | Admit: 2021-12-05 | Discharge: 2021-12-05 | Disposition: A | Discharge status: Home / Self Care | Payer: BC Managed Care – PPO | Attending: Emergency Medicine | Admitting: Emergency Medicine

## 2021-12-05 ENCOUNTER — Other Ambulatory Visit: Payer: Self-pay | Admitting: Internal Medicine

## 2021-12-05 DIAGNOSIS — Z86010 Personal history of colonic polyps: Secondary | ICD-10-CM | POA: Diagnosis not present

## 2021-12-05 DIAGNOSIS — M5442 Lumbago with sciatica, left side: Secondary | ICD-10-CM | POA: Diagnosis not present

## 2021-12-05 DIAGNOSIS — Z85850 Personal history of malignant neoplasm of thyroid: Secondary | ICD-10-CM | POA: Diagnosis not present

## 2021-12-05 DIAGNOSIS — Z8616 Personal history of COVID-19: Secondary | ICD-10-CM | POA: Diagnosis not present

## 2021-12-05 DIAGNOSIS — Z79899 Other long term (current) drug therapy: Secondary | ICD-10-CM | POA: Diagnosis not present

## 2021-12-05 DIAGNOSIS — M545 Low back pain, unspecified: Secondary | ICD-10-CM | POA: Diagnosis present

## 2021-12-05 DIAGNOSIS — E039 Hypothyroidism, unspecified: Secondary | ICD-10-CM | POA: Diagnosis not present

## 2021-12-05 LAB — URINALYSIS, ROUTINE W REFLEX MICROSCOPIC
Bilirubin Urine: NEGATIVE
Glucose, UA: NEGATIVE mg/dL
Hgb urine dipstick: NEGATIVE
Ketones, ur: NEGATIVE mg/dL
Leukocytes,Ua: NEGATIVE
Nitrite: NEGATIVE
Protein, ur: NEGATIVE mg/dL
Specific Gravity, Urine: 1.018 (ref 1.005–1.030)
pH: 6 (ref 5.0–8.0)

## 2021-12-05 MED ORDER — KETOROLAC TROMETHAMINE 30 MG/ML IJ SOLN
30.0000 mg | Freq: Once | INTRAMUSCULAR | Status: AC
  Administered 2021-12-05: 14:00:00 30 mg via INTRAMUSCULAR
  Filled 2021-12-05: qty 1

## 2021-12-05 MED ORDER — OXYCODONE-ACETAMINOPHEN 5-325 MG PO TABS: 1.0000 | ORAL_TABLET | Freq: Four times a day (QID) | ORAL | 0 refills | Status: AC | PRN

## 2021-12-05 MED ORDER — PREDNISONE 10 MG (21) PO TBPK: ORAL_TABLET | Freq: Every day | ORAL | 0 refills | Status: DC

## 2021-12-05 NOTE — ED Provider Notes (Signed)
[facility] EMERGENCY DEPT Provider Note   CSN: [id number] Arrival date & time: 12/05/21  1336     History Chief Complaint  Patient presents with   Back Pain    Elijah Ashley is a 55 y.o. male. Presents with 4 days of left lower back pain.  He says that this occurred when he was sitting down on the toilet and started suddenly.  The pain is been constant since then.  Pain is worse with movement and gets better at rest.  He says that it radiates around to his lower abdomen into his groin and down his left leg to his toes.  He has normal walking ability.  He denies any weakness or numbness to his lower extremities.  He has no saddle anesthesia, bowel or bladder dysfunction.  He was seen for the exact same symptoms in 2016 where he had MRI and he was found to have a disc herniation.  Patient states that he had the same genital and lower abdominal symptoms at that time as well.  He denies any current urinary symptoms such as dysuria or hematuria.   Back Pain Associated symptoms: abdominal pain   Associated symptoms: no numbness and no weakness       Past Medical History:  Diagnosis Date   Allergy    Anxiety    Depression    GERD (gastroesophageal reflux disease)    past hx- some now that comes and goes   Glaucoma (increased eye pressure)    Hemorrhoids    pt states he gets regular flares with these    Hx of adenomatous polyp of colon 10/02/2017   Hyperlipidemia    slightly elevated- working on diet and exercise, no meds    HYPOTHYROIDISM    postsurgical   LOW BACK PAIN    L4-5 degen disc on MRI - s/p ESI 09/2010   Papillary thyroid carcinoma ([place]) 06/16/09 dx   total thyroidectomy for cold nodule & Graves disease   Psoriasis    URINARY RETENTION     Patient Active Problem List   Diagnosis Date Noted   Arthralgia 04/19/2021   Insomnia 04/19/2021   Stomatitis and mucositis 01/08/2021   COVID-19 01/08/2021   Shoulder pain, left 03/21/2020    Nonallopathic lesion of cervical region 02/01/2020   Nonallopathic lesion of rib cage 02/01/2020   Nonallopathic lesion of thoracic region 02/01/2020   Acute bursitis of left shoulder 12/21/2019   Pes planus 12/21/2019   Epigastric pain 07/22/2019   Allergic rhinitis 07/22/2019   Wheezing 07/22/2019   Dyslipidemia 11/09/2018   Hoarseness 11/09/2018   Bladder neck obstruction 11/09/2018   Generalized anxiety disorder 10/18/2018   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   Protrusion of cervical intervertebral disc 06/30/2018   S/P cervical spinal fusion 05/11/2018   Ingrowing hair 01/02/2018   Elevated LFTs 11/20/2017   Urinary tract infection 10/14/2017   Fever and chills 10/14/2017   Paresthesia 10/14/2017   Hx of adenomatous polyp of colon 10/02/2017   Family history of colon cancer - brother 67's and mother 41's 09/24/2017   Bipolar depression ([place]) 03/20/2017   Overweight (BMI 25.0-29.9) 03/20/2017   Family history of colitis 03/20/2017   Patellar tendinitis 08/04/2015   Anxiety    Glaucoma    URINARY RETENTION 09/17/2010   Hypothyroidism 09/12/2010   LOW BACK PAIN 09/12/2010    Past Surgical History:  Procedure Laterality Date   CERVICAL LAMINECTOMY  07/2007   C5-6 ACDF due to myelopathy , C3-6 per  report    TOTAL THYROIDECTOMY  03/2009   graves and cold nodule (papillary ca)       Family History  Problem Relation Age of Onset   Breast cancer Other    Cervical cancer Other    Colon cancer Other    Hypertension Other    Ulcerative colitis Sister    Diabetes Father    Pancreatic cancer Sister 59       ? colon involvement   Colon cancer Brother 14       50's   Heart attack Brother    Colon cancer Mother 53       70's   Rectal cancer Neg Hx    Stomach cancer Neg Hx     Social History   Tobacco Use   Smoking status: Never   Smokeless tobacco: Never  Vaping Use   Vaping Use: Former  Substance Use Topics   Alcohol use: Yes    Comment:  socially only    Drug use: No    Home Medications Prior to Admission medications   Medication Sig Start Date End Date Taking? Authorizing Provider  oxyCODONE-acetaminophen (PERCOCET/ROXICET) 5-325 MG tablet Take 1 tablet by mouth every 6 (six) hours as needed for up to 5 days for severe pain. 12/05/21 12/10/21 Yes Yovanni Frenette, Adora Fridge, PA-C  predniSONE (STERAPRED UNI-PAK 21 TAB) 10 MG (21) TBPK tablet Take by mouth daily. Take 6 tabs by mouth daily  for 2 days, then 5 tabs for 2 days, then 4 tabs for 2 days, then 3 tabs for 2 days, 2 tabs for 2 days, then 1 tab by mouth daily for 2 days 12/05/21  Yes Tabatha Razzano, Adora Fridge, PA-C  albuterol (VENTOLIN HFA) 108 (90 Base) MCG/ACT inhaler TAKE 2 PUFFS BY MOUTH EVERY 6 HOURS AS NEEDED FOR WHEEZE OR SHORTNESS OF BREATH 02/23/21   Plotnikov, Evie Lacks, MD  Cholecalciferol (VITAMIN D3) 2000 units capsule Take 1 capsule (2,000 Units total) by mouth daily. 03/20/17   Plotnikov, Evie Lacks, MD  clonazePAM (KLONOPIN) 1 MG tablet Take 1 tablet (1 mg total) by mouth 2 (two) times daily as needed for anxiety. 01/22/22   Thayer Headings, PMHNP  Cyanocobalamin (VITAMIN B 12 PO) Take by mouth.    [provider]  cyclobenzaprine (FLEXERIL) 10 MG tablet Take 1 tablet (10 mg total) by mouth at bedtime. Patient taking differently: Take 10 mg by mouth at bedtime. Takes as needed 05/31/20   Marybelle Killings, MD  dorzolamide-timolol (COSOPT) 22.3-6.8 MG/ML ophthalmic solution Place 1 drop into both eyes 2 (two) times daily.    [provider]  FLUoxetine (PROZAC) 40 MG capsule Take 1 capsule by mouth total 40 mg daily after work 10/29/21   Thayer Headings, PMHNP  latanoprost (XALATAN) 0.005 % ophthalmic solution Place 1 drop into both eyes at bedtime. 02/24/12   Renato Shin, MD  levothyroxine (SYNTHROID) 150 MCG tablet Take 150 mcg by mouth every morning. Take 1 by mouth every morning 09/12/21   [provider]  loratadine (CLARITIN) 10 MG tablet Take 1 tablet  (10 mg total) by mouth daily. 09/25/21   Plotnikov, Evie Lacks, MD  meloxicam (MOBIC) 15 MG tablet TAKE 1 TABLET (15 MG TOTAL) BY MOUTH DAILY. Patient not taking: Reported on 10/29/2021 10/12/21   Plotnikov, Evie Lacks, MD  Multiple Vitamins-Minerals (IMMUNE SUPPORT PO) Take by mouth.    [provider]  OVER THE COUNTER MEDICATION Take 1 capsule by mouth daily. Ginger and tumeric daily Patient not taking:  Reported on 10/29/2021    [provider]  pantoprazole (PROTONIX) 40 MG tablet TAKE 1 TABLET BY MOUTH EVERY DAY 10/12/21   Plotnikov, Evie Lacks, MD  triamcinolone ointment (KENALOG) 0.1 % APPLY TO AFFECTED AREA TWICE A DAY 12/05/21   Plotnikov, Evie Lacks, MD    Allergies    Patient has no known allergies.  Review of Systems   Review of Systems  Gastrointestinal:  Positive for abdominal pain.  Genitourinary:  Positive for testicular pain.  Musculoskeletal:  Positive for back pain.  Neurological:  Negative for weakness and numbness.  All other systems reviewed and are negative.  Physical Exam Updated Vital Signs BP 119/84    Pulse 88    Temp 98.4 F (36.9 C) (Oral)    Resp 20    SpO2 100%   Physical Exam Vitals and nursing note reviewed.  Constitutional:      General: He is not in acute distress.    Appearance: Normal appearance. He is well-developed. He is not ill-appearing, toxic-appearing or diaphoretic.  HENT:     Head: Normocephalic and atraumatic.     Nose: No nasal deformity.     Mouth/Throat:     Lips: Pink. No lesions.  Eyes:     General: Gaze aligned appropriately. No scleral icterus.       Right eye: No discharge.        Left eye: No discharge.     Conjunctiva/sclera: Conjunctivae normal.     Right eye: Right conjunctiva is not injected. No exudate or hemorrhage.    Left eye: Left conjunctiva is not injected. No exudate or hemorrhage. Pulmonary:     Effort: Pulmonary effort is normal. No respiratory distress.  Abdominal:     General: Abdomen  is flat.     Palpations: Abdomen is soft.     Tenderness: There is no abdominal tenderness. There is no right CVA tenderness, left CVA tenderness, guarding or rebound.  Musculoskeletal:     Right lower leg: No edema.     Left lower leg: No edema.     Comments: No midline tenderness of spine, no stepoff or deformity; reproducible muscular tenderness in paraspinal muscles DP/PT pulses 2+ and equal bilaterally No leg edema Sensation grossly intact on anterior thighs, dorsum of foot and lateral foot Strength of knee flexion and extension is 5/5 Plantar and dorsiflexion of ankle 5/5 Gait normal.   Skin:    General: Skin is warm and dry.  Neurological:     Mental Status: He is alert and oriented to person, place, and time.  Psychiatric:        Mood and Affect: Mood normal.        Speech: Speech normal.        Behavior: Behavior normal. Behavior is cooperative.    ED Results / Procedures / Treatments   Labs (all labs ordered are listed, but only abnormal results are displayed) Labs Reviewed  URINALYSIS, ROUTINE W REFLEX MICROSCOPIC    EKG None  Radiology No results found.  Procedures Procedures   Medications Ordered in ED Medications  ketorolac (TORADOL) 30 MG/ML injection 30 mg (30 mg Intramuscular Given 12/05/21 1424)    ED Course  I have reviewed the triage vital signs and the nursing notes.  Pertinent labs & imaging results that were available during my care of the patient were reviewed by me and considered in my medical decision making (see chart for details).    MDM Rules/Calculators/A&P  Is a 55 year old male with a history of a herniated lumbar disc who presents the emergency department with 4 days of left lower back pain that radiates down to his left leg.  He has associated radiation to his groin and lower abdomen. Vital stable Exam with no TTP of lumbar spine or step-offs noted.  He has reproducible paraspinal tenderness to palpation  of the left lower back.  Lower Extremity with normal sensation, strength, range of motion, and pulses.  No red flag symptoms present on history or exam.  He does have symptoms of pain radiating to his groin and lower abdomen, however the symptoms were present the last time he presented with similar symptoms when he had a herniated disc.  Will screen for urinary tract pathology with UA.   Urine with no blood or signs of infection. Plan to treat this similarly to the previous time that he had a herniated disc.   IM toradol given here. D/C on Prednisone taper and pain medication. Patient ambulated prior to discharge without difficulty.  I discussed this case with my attending physician, Dr. Laverta Baltimore.    Final Clinical Impression(s) / ED Diagnoses Final diagnoses:  Acute left-sided low back pain with left-sided sciatica    Rx / DC Orders ED Discharge Orders          Ordered    predniSONE (STERAPRED UNI-PAK 21 TAB) 10 MG (21) TBPK tablet  Daily        12/05/21 1459    oxyCODONE-acetaminophen (PERCOCET/ROXICET) 5-325 MG tablet  Every 6 hours PRN        12/05/21 1459             Sheila Oats 12/05/21 1713    Margette Fast, MD 12/06/21 [phone]

## 2021-12-05 NOTE — ED Triage Notes (Signed)
Pt presents with lower back pain > Left side radiating around to his navel x4 days. Pt reports pain to his Left groin and leg. Pt reports his symptoms started when he attempted to sit on the toilet. Pt took Tylenol 3 this am

## 2021-12-05 NOTE — ED Notes (Signed)
Dc instructions reviewed with patient. Patient voiced understanding. Dc with belongings.  °

## 2021-12-05 NOTE — Discharge Instructions (Addendum)
Please follow-up with your orthopedic doctor in the next week. I prescribed you a prednisone taper and a couple of days of Percocet medication.  Please do not Percocet prior to driving as it can cause a sedative effect. Return to the emergency department if you develop bowel or bladder dysfunction, numbness or weakness of bilateral lower extremities, or numbness of your groin area.

## 2021-12-16 HISTORY — PX: ROTATOR CUFF REPAIR: SHX139

## 2021-12-19 ENCOUNTER — Other Ambulatory Visit: Payer: Self-pay

## 2021-12-19 ENCOUNTER — Ambulatory Visit: Payer: Self-pay

## 2021-12-19 ENCOUNTER — Encounter: Payer: Self-pay | Admitting: Orthopaedic Surgery

## 2021-12-19 ENCOUNTER — Ambulatory Visit (INDEPENDENT_AMBULATORY_CARE_PROVIDER_SITE_OTHER): Payer: BC Managed Care – PPO | Admitting: Orthopaedic Surgery

## 2021-12-19 VITALS — BP 108/71 | HR 82 | Ht 77.0 in | Wt 253.0 lb

## 2021-12-19 DIAGNOSIS — M545 Low back pain, unspecified: Secondary | ICD-10-CM | POA: Diagnosis not present

## 2021-12-20 ENCOUNTER — Ambulatory Visit (INDEPENDENT_AMBULATORY_CARE_PROVIDER_SITE_OTHER): Payer: BC Managed Care – PPO | Admitting: Mental Health

## 2021-12-20 DIAGNOSIS — F411 Generalized anxiety disorder: Secondary | ICD-10-CM

## 2021-12-20 NOTE — Progress Notes (Signed)
[facility] Counsel[facility] Initial Adult Exam  Name: Elijah Ashley Date: 12/20/2021 MRN: [id number] DOB: December 16, 1966 PCP: Cassandria Anger, MD  Time spent: 53 minutes  Reason for Visit /Presenting Problem: Patient reports his wife copes w/ a rare inflammatory disease. He worries about her health, the future. He is a truck driver full time, has a lot of time to "think". She was dx'd 7 years ago, treatments have been challenging. He feels helpless, knows he needs to be the best support he can to her. They daughter is age 51, son is age 79 (87). His family is from [place]; mother passed 2 years ago, 2 sisters have passed, 3 brothers. Uses meditation in the mornings, tries to play his bass and keyboard as outlets for enjoyment and to de-stress.   Mental Status Exam:    Appearance:    Casual     Behavior:   Appropriate  Motor:   WNL  Speech/Language:    Clear and Coherent  Affect:   Full range   Mood:   Euthymic  Thought process:   Logical, linear, goal directed  Thought content:     WNL  Sensory/Perceptual disturbances:     none  Orientation:   x4  Attention:   Good  Concentration:   Good  Memory:   Intact  Fund of knowledge:    Consistent with age and development  Insight:     Good  Judgment:    Good  Impulse Control:   Good     Reported Symptoms:  some sadness, anxiety  Risk Assessment: Danger to Self:  No Self-injurious Behavior: No Danger to Others: No Duty to Warn:no Physical Aggression / Violence:No  Access to Firearms a concern: No  Gang Involvement:No  Patient / guardian was educated about steps to take if suicide or homicide risk level increases between visits: yes While future psychiatric events cannot be accurately predicted, the patient does not currently require acute inpatient psychiatric care and does not currently meet [facility] involuntary commitment criteria.  Substance Abuse History: Current substance abuse: none  Past Psychiatric History:    Outpatient Providers:[facility]  History of Psych Hospitalization: No  Psychological Testing: none   Family History:  Family History  Problem Relation Age of Onset   Breast cancer Other    Cervical cancer Other    Colon cancer Other    Hypertension Other    Ulcerative colitis Sister    Diabetes Father    Pancreatic cancer Sister 21       ? colon involvement   Colon cancer Brother 50       50's   Heart attack Brother    Colon cancer Mother 34       70's   Rectal cancer Neg Hx    Stomach cancer Neg Hx     Living situation: the patient lives with their family  Sexual Orientation:  Straight  Relationship Status: married              If a parent, number of children / ages: daughter- age 55, 81- age 26  Support Systems; spouse, family  Financial Stress:  No   Income/Employment/Disability: Employment- full time Holiday representative: No   Educational History: Education: high school diploma/GED  Religion/Sprituality/World View:   none stated   Any cultural differences that may affect / interfere with treatment:   none stated   Recreation/Hobbies: fishing, music  Stressors: family related  Strengths:  Supportive Relationships, Family, and Friends  Barriers:  none   Legal History: Pending legal issue / charges: none History of legal issue / charges: none  Medical History/Surgical History: Past Medical History:  Diagnosis Date   Allergy    Anxiety    Depression    GERD (gastroesophageal reflux disease)    past hx- some now that comes and goes   Glaucoma (increased eye pressure)    Hemorrhoids    pt states he gets regular flares with these    Hx of adenomatous polyp of colon 10/02/2017   Hyperlipidemia    slightly elevated- working on diet and exercise, no meds    HYPOTHYROIDISM    postsurgical   LOW BACK PAIN    L4-5 degen disc on MRI - s/p ESI 09/2010   Papillary thyroid carcinoma ([place]) 06/16/09 dx   total thyroidectomy for cold nodule &  Graves disease   Psoriasis    URINARY RETENTION     Past Surgical History:  Procedure Laterality Date   CERVICAL LAMINECTOMY  07/2007   C5-6 ACDF due to myelopathy , C3-6 per report    TOTAL THYROIDECTOMY  03/2009   graves and cold nodule (papillary ca)    Medications: Current Outpatient Medications  Medication Sig Dispense Refill   albuterol (VENTOLIN HFA) 108 (90 Base) MCG/ACT inhaler TAKE 2 PUFFS BY MOUTH EVERY 6 HOURS AS NEEDED FOR WHEEZE OR SHORTNESS OF BREATH 18 each 5   Cholecalciferol (VITAMIN D3) 2000 units capsule Take 1 capsule (2,000 Units total) by mouth daily. 100 capsule 3   [START ON 01/22/2022] clonazePAM (KLONOPIN) 1 MG tablet Take 1 tablet (1 mg total) by mouth 2 (two) times daily as needed for anxiety. 180 tablet 1   Cyanocobalamin (VITAMIN B 12 PO) Take by mouth.     cyclobenzaprine (FLEXERIL) 10 MG tablet Take 1 tablet (10 mg total) by mouth at bedtime. (Patient taking differently: Take 10 mg by mouth at bedtime. Takes as needed) 20 tablet 0   dorzolamide-timolol (COSOPT) 22.3-6.8 MG/ML ophthalmic solution Place 1 drop into both eyes 2 (two) times daily.     FLUoxetine (PROZAC) 40 MG capsule Take 1 capsule by mouth total 40 mg daily after work 90 capsule 1   latanoprost (XALATAN) 0.005 % ophthalmic solution Place 1 drop into both eyes at bedtime. 2.5 mL 1   levothyroxine (SYNTHROID) 150 MCG tablet Take 150 mcg by mouth every morning. Take 1 by mouth every morning     loratadine (CLARITIN) 10 MG tablet Take 1 tablet (10 mg total) by mouth daily. 100 tablet 3   meloxicam (MOBIC) 15 MG tablet TAKE 1 TABLET (15 MG TOTAL) BY MOUTH DAILY. (Patient not taking: Reported on 10/29/2021) 90 tablet 1   Multiple Vitamins-Minerals (IMMUNE SUPPORT PO) Take by mouth.     OVER THE COUNTER MEDICATION Take 1 capsule by mouth daily. Ginger and tumeric daily (Patient not taking: Reported on 10/29/2021)     pantoprazole (PROTONIX) 40 MG tablet TAKE 1 TABLET BY MOUTH EVERY DAY 90 tablet 1    predniSONE (STERAPRED UNI-PAK 21 TAB) 10 MG (21) TBPK tablet Take by mouth daily. Take 6 tabs by mouth daily  for 2 days, then 5 tabs for 2 days, then 4 tabs for 2 days, then 3 tabs for 2 days, 2 tabs for 2 days, then 1 tab by mouth daily for 2 days 42 tablet 0   triamcinolone ointment (KENALOG) 0.1 % APPLY TO AFFECTED AREA TWICE A DAY 80 g 1   No current facility-administered medications for this visit.  No Known Allergies  Diagnoses:    ICD-10-CM   1. Generalized anxiety disorder  F41.1       Plan of Care: TBD   [place], [facility]

## 2021-12-23 NOTE — Progress Notes (Signed)
Office Visit Note   Patient: Elijah Ashley           Date of Birth: 02/11/1966           MRN: [id number] Visit Date: 12/19/2021              Requested by: Cassandria Anger, MD [place],  [place] [id number] PCP: Plotnikov, Evie Lacks, MD   Assessment & Plan: Visit Diagnoses:  1. Acute bilateral low back pain, unspecified whether sciatica present     Plan: Patient neurologically intact and  is ambulatory.  We discussed Further imaging studies if his symptoms persist.  No claudication symptoms.  Follow-Up Instructions: No follow-ups on file.   Orders:  Orders Placed This Encounter  Procedures   XR Lumbar Spine 2-3 Views   No orders of the defined types were placed in this encounter.     Procedures: No procedures performed   Clinical Data: No additional findings.   Subjective: Chief Complaint  Patient presents with   Lower Back - Pain    HPI 56 year old male seen with back pain.  Patient states he was sitting in the bathroom suddenly had significant increase in pain he used a brace, heat.  He has been to the driver's emergency room given a 12-day prednisone pack which did help.  He has increased pain with sitting.  More pain on the left than right side that radiates around his left hip.  He had some meloxicam after shoulder surgery but started having GI issues with this.  Patient drives for [employer].  Previous epidurals 11 years ago for L4-5 disc degeneration.  Past problems with anxiety depression GERD.  Review of Systems all other systems noncontributory to HPI.   Objective: Vital Signs: BP 108/71    Pulse 82    Ht 6\' 5"  (1.956 m)    Wt 253 lb (114.8 kg)    BMI 30.00 kg/m   Physical Exam Constitutional:      Appearance: He is well-developed.  HENT:     Head: Normocephalic and atraumatic.     Right Ear: External ear normal.     Left Ear: External ear normal.  Eyes:     Pupils: Pupils are equal, round, and reactive to light.  Neck:      Thyroid: No thyromegaly.     Trachea: No tracheal deviation.  Cardiovascular:     Rate and Rhythm: Normal rate.  Pulmonary:     Effort: Pulmonary effort is normal.     Breath sounds: No wheezing.  Abdominal:     General: Bowel sounds are normal.     Palpations: Abdomen is soft.  Musculoskeletal:     Cervical back: Neck supple.  Skin:    General: Skin is warm and dry.     Capillary Refill: Capillary refill takes less than 2 seconds.  Neurological:     Mental Status: He is alert and oriented to person, place, and time.  Psychiatric:        Behavior: Behavior normal.        Thought Content: Thought content normal.        Judgment: Judgment normal.    Ortho Exam negative logroll the hips.  Knee and ankle jerk are intact.  Pedal pulses 2+ and symmetrical.  Specialty Comments:  No specialty comments available.  Imaging: P lateral lumbar spine images were obtained and reviewed.  No spondylolisthesis negative for compression fractures.  Mild facet degenerative changes.  Impression: Unremarkable lumbar spine  images.   PMFS History: Patient Active Problem List   Diagnosis Date Noted   Arthralgia 04/19/2021   Insomnia 04/19/2021   Stomatitis and mucositis 01/08/2021   COVID-19 01/08/2021   Shoulder pain, left 03/21/2020   Nonallopathic lesion of cervical region 02/01/2020   Nonallopathic lesion of rib cage 02/01/2020   Nonallopathic lesion of thoracic region 02/01/2020   Acute bursitis of left shoulder 12/21/2019   Pes planus 12/21/2019   Epigastric pain 07/22/2019   Allergic rhinitis 07/22/2019   Wheezing 07/22/2019   Dyslipidemia 11/09/2018   Hoarseness 11/09/2018   Bladder neck obstruction 11/09/2018   Generalized anxiety disorder 10/18/2018   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   Protrusion of cervical intervertebral disc 06/30/2018   S/P cervical spinal fusion 05/11/2018   Ingrowing hair 01/02/2018   Elevated LFTs 11/20/2017   Urinary tract infection  10/14/2017   Fever and chills 10/14/2017   Paresthesia 10/14/2017   Hx of adenomatous polyp of colon 10/02/2017   Family history of colon cancer - brother 35's and mother 27's 09/24/2017   Bipolar depression ([facility]) 03/20/2017   Overweight (BMI 25.0-29.9) 03/20/2017   Family history of colitis 03/20/2017   Patellar tendinitis 08/04/2015   Anxiety    Glaucoma    URINARY RETENTION 09/17/2010   Hypothyroidism 09/12/2010   LOW BACK PAIN 09/12/2010   Past Medical History:  Diagnosis Date   Allergy    Anxiety    Depression    GERD (gastroesophageal reflux disease)    past hx- some now that comes and goes   Glaucoma (increased eye pressure)    Hemorrhoids    pt states he gets regular flares with these    Hx of adenomatous polyp of colon 10/02/2017   Hyperlipidemia    slightly elevated- working on diet and exercise, no meds    HYPOTHYROIDISM    postsurgical   LOW BACK PAIN    L4-5 degen disc on MRI - s/p ESI 09/2010   Papillary thyroid carcinoma ([place]) 06/16/09 dx   total thyroidectomy for cold nodule & Graves disease   Psoriasis    URINARY RETENTION     Family History  Problem Relation Age of Onset   Breast cancer Other    Cervical cancer Other    Colon cancer Other    Hypertension Other    Ulcerative colitis Sister    Diabetes Father    Pancreatic cancer Sister 39       ? colon involvement   Colon cancer Brother 31       50's   Heart attack Brother    Colon cancer Mother 74       70's   Rectal cancer Neg Hx    Stomach cancer Neg Hx     Past Surgical History:  Procedure Laterality Date   CERVICAL LAMINECTOMY  07/2007   C5-6 ACDF due to myelopathy , C3-6 per report    TOTAL THYROIDECTOMY  03/2009   graves and cold nodule (papillary ca)   Social History   Occupational History   Occupation: Advice worker: [employer]  Tobacco Use   Smoking status: Never   Smokeless tobacco: Never  Vaping Use   Vaping Use: Former  Substance and Sexual Activity   Alcohol use: Yes     Comment: socially only    Drug use: No   Sexual activity: Yes

## 2022-01-17 ENCOUNTER — Other Ambulatory Visit: Payer: Self-pay

## 2022-01-17 ENCOUNTER — Ambulatory Visit (INDEPENDENT_AMBULATORY_CARE_PROVIDER_SITE_OTHER): Payer: BC Managed Care – PPO | Admitting: Mental Health

## 2022-01-17 DIAGNOSIS — F411 Generalized anxiety disorder: Secondary | ICD-10-CM

## 2022-01-17 NOTE — Progress Notes (Signed)
[facility] psychotherapy note  Name: Elijah Ashley Date: 01/17/2022 MRN: [id number] DOB: [birth year] PCP: Cassandria Anger, MD  Time spent: 55 minutes  Treatment:  ind. therapy  Mental Status Exam:    Appearance:    Casual     Behavior:   Appropriate  Motor:   WNL  Speech/Language:    Clear and Coherent  Affect:   Full range   Mood:   Euthymic  Thought process:   Logical, linear, goal directed  Thought content:     WNL  Sensory/Perceptual disturbances:     none  Orientation:   x4  Attention:   Good  Concentration:   Good  Memory:   Intact  Fund of knowledge:    Consistent with age and development  Insight:     Good  Judgment:    Good  Impulse Control:   Good     Reported Symptoms:  some sadness, anxiety  Risk Assessment: Danger to Self:  No Self-injurious Behavior: No Danger to Others: No Duty to Warn:no Physical Aggression / Violence:No  Access to Firearms a concern: No  Gang Involvement:No  Patient / guardian was educated about steps to take if suicide or homicide risk level increases between visits: yes While future psychiatric events cannot be accurately predicted, the patient does not currently require acute inpatient psychiatric care and does not currently meet [facility] involuntary commitment criteria.   Subjective: Patient arrived on time for today's session.  Provided more family history, his growing up in [place] with his siblings, he stated that he still has family there.  Also more information shared related to his primary family, his wanting to have a closer relationship with his daughter who is 42, states that she is closer with her mother in comparison.  He identified wanting to do more activities with her to continue to reinforce their bond.  His stepson remains home.  He reviewed some recent events that his son had gone through, returning home a few months ago.  He stated that he currently works part-time however, he wants his  stepson to be more motivated and to start looking for full-time employment possibly, have more of a life direction as he stated he often plays video games if not working.  Other family issues were suggested as stressful however, due to time restrictions today he did not elaborate further.  He identified the need to have more discussions about these issues at some point.  We plan to further discuss next session.  Diagnoses:    ICD-10-CM   1. Generalized anxiety disorder  F41.1       Plan: Patient is to use CBT, mindfulness and coping skills.  Utilize his support system and engaging pleasurable activities, such as playing his bass and other instruments.   Long-term goal:   Reduce overall level, frequency, and intensity of the feelings of anxiety at least 3 consecutive months per patient report.    Short-term goal:  Decrease worry about life stressors and improve coping to manage anxiety levels Make efforts to be consistent with self-care.  Engage in interests such as playing his base and other musical instruments, adhere to engaging in meditation   Assessment of progress:  progressing   This record has been created using [employer].  Chart creation errors have been sought, but may not always have been located and corrected. Such creation errors do not reflect on the standard of medical care.   Anson Oregon, [facility]

## 2022-01-31 ENCOUNTER — Ambulatory Visit (INDEPENDENT_AMBULATORY_CARE_PROVIDER_SITE_OTHER): Payer: BC Managed Care – PPO | Admitting: Mental Health

## 2022-01-31 ENCOUNTER — Other Ambulatory Visit: Payer: Self-pay

## 2022-01-31 DIAGNOSIS — F411 Generalized anxiety disorder: Secondary | ICD-10-CM | POA: Diagnosis not present
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
## 2022-02-14 ENCOUNTER — Ambulatory Visit: Payer: BC Managed Care – PPO | Admitting: Mental Health

## 2022-02-17 ENCOUNTER — Other Ambulatory Visit: Payer: Self-pay | Admitting: Internal Medicine

## 2022-02-18 ENCOUNTER — Encounter: Payer: Self-pay | Admitting: Internal Medicine

## 2022-02-28 ENCOUNTER — Other Ambulatory Visit: Payer: Self-pay

## 2022-02-28 ENCOUNTER — Ambulatory Visit (INDEPENDENT_AMBULATORY_CARE_PROVIDER_SITE_OTHER): Payer: BC Managed Care – PPO | Admitting: Mental Health

## 2022-02-28 DIAGNOSIS — F411 Generalized anxiety disorder: Secondary | ICD-10-CM | POA: Diagnosis not present
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
## 2022-03-14 ENCOUNTER — Ambulatory Visit (INDEPENDENT_AMBULATORY_CARE_PROVIDER_SITE_OTHER): Payer: BC Managed Care – PPO | Admitting: Mental Health

## 2022-03-14 DIAGNOSIS — F411 Generalized anxiety disorder: Secondary | ICD-10-CM

## 2022-03-14 NOTE — Progress Notes (Signed)
[facility] psychotherapy note ? ?Name: Elijah Ashley ?Date: 03/14/2022 ?MRN: [id number] ?DOB: [birth year] ?PCP: Plotnikov, Evie Lacks, MD ? ?Time spent: 55 minutes ? ?Treatment:  ind. therapy ? ?Mental Status Exam: ?   ?Appearance:    Casual     ?Behavior:   Appropriate  ?Motor:   WNL  ?Speech/Language:    Clear and Coherent  ?Affect:   Full range   ?Mood:   Euthymic  ?Thought process:   Logical, linear, goal directed  ?Thought content:     WNL  ?Sensory/Perceptual disturbances:     none  ?Orientation:   x4  ?Attention:   Good  ?Concentration:   Good  ?Memory:   Intact  ?Fund of knowledge:    Consistent with age and development  ?Insight:     Good  ?Judgment:    Good  ?Impulse Control:   Good  ?  ? ?Reported Symptoms:  some sadness, anxiety ? ?Risk Assessment: ?Danger to Self:  No ?Self-injurious Behavior: No ?Danger to Others: No ?Duty to Warn:no ?Physical Aggression / Violence:No  ?Access to Firearms a concern: No  ?Gang Involvement:No  ?Patient / guardian was educated about steps to take if suicide or homicide risk level increases between visits: yes ?While future psychiatric events cannot be accurately predicted, the patient does not currently require acute inpatient psychiatric care and does not currently meet [facility] involuntary commitment criteria. ? ? ?Subjective: ?Patient arrived on time for today's session.  He shared how he has had recent anxiety related to medical issues.  He went on to share more of his medical history related to some past surgeries on both his shoulder and his upper back a few years ago.  He stated that he had sharp pain in his chest radiating to his back recently when working out, lifting weights.  He discussed with his wife where she was able to rub his back which was helpful however, the pain continues to persist and the patient went on to share how this activates a lot of his anxiety, going on to share associated thoughts such as his possibly having the same  medical issues his sisters endured prior to their passing a few years ago.  Assisted him in further articulating what he knows to be true up to this point, which is that his issue may just be related to his working out but that seeing a doctor might be helpful to get clarification.  We discussed the concept of thought stopping as a way to cope as well as recognizing his tendency to catastrophize which he acknowledges is understandable given his having a history of medical issues and also the many loved ones he has lost due to medical challenges over the past few years.  He stated that his family has been in a better place, with a focus on his wife and 25 year old daughter.  He stated that their house is much more peaceful due to his stepson not returning after the incident that took place a few weeks ago.   ? ? ?Interventions: Supportive therapy, CBT ? ? ? ?Diagnoses:  ?  ICD-10-CM   ?1. Generalized anxiety disorder  F41.1   ?  ? ? ? ?Plan: Patient is to use CBT, mindfulness and coping skills.  Utilize his support system and engaging pleasurable activities, such as playing his bass and other instruments. ?  ?Long-term goal:   ?Reduce overall level, frequency, and intensity of the feelings of anxiety at least 3 consecutive months per patient report.  ?  ?  Short-term goal:  ?Decrease worry about life stressors and improve coping to manage anxiety levels ?Make efforts to be consistent with self-care.  Engage in interests such as playing his base and other musical instruments, adhere to engaging in meditation ? ? ? ?Assessment of progress:  progressing ? ? ?This record has been created using [employer].  Chart creation errors have been sought, but may not always have been located and corrected. Such creation errors do not reflect on the standard of medical care.  ? ?Anson [place], [facility]  ? ? ? ?

## 2022-03-27 ENCOUNTER — Encounter: Payer: Self-pay | Admitting: Internal Medicine

## 2022-03-27 ENCOUNTER — Ambulatory Visit: Payer: BC Managed Care – PPO | Admitting: Internal Medicine

## 2022-03-27 DIAGNOSIS — M542 Cervicalgia: Secondary | ICD-10-CM | POA: Diagnosis not present

## 2022-03-27 DIAGNOSIS — R040 Epistaxis: Secondary | ICD-10-CM

## 2022-03-27 DIAGNOSIS — N529 Male erectile dysfunction, unspecified: Secondary | ICD-10-CM | POA: Insufficient documentation

## 2022-03-27 MED ORDER — TADALAFIL 20 MG PO TABS: 10.0000 mg | ORAL_TABLET | ORAL | 5 refills | Status: DC | PRN

## 2022-03-27 NOTE — Progress Notes (Signed)
? ?Subjective:  ?Patient ID: Elijah Ashley, male    DOB: [birth year]  Age: 56 y.o. MRN: [id number] ? ?CC: No chief complaint on file. ? ? ?HPI ?Elijah Ashley presents for dizziness at times. ?C/o L nostril bleeding-infrequent. ?Elijah Ashley is complaining of erectile dysfunction. ?She has been working a lot.  He is a Administrator. ?She is here with his wife Elijah Ashley who is helping with history ? ?Outpatient Medications Prior to Visit  ?Medication Sig Dispense Refill  ? albuterol (VENTOLIN HFA) 108 (90 Base) MCG/ACT inhaler TAKE 2 PUFFS BY MOUTH EVERY 6 HOURS AS NEEDED FOR WHEEZE OR SHORTNESS OF BREATH 18 each 5  ? Cholecalciferol (VITAMIN D3) 2000 units capsule Take 1 capsule (2,000 Units total) by mouth daily. 100 capsule 3  ? clonazePAM (KLONOPIN) 1 MG tablet Take 1 tablet (1 mg total) by mouth 2 (two) times daily as needed for anxiety. 180 tablet 1  ? Cyanocobalamin (VITAMIN B 12 PO) Take by mouth.    ? cyclobenzaprine (FLEXERIL) 10 MG tablet Take 1 tablet (10 mg total) by mouth at bedtime. (Patient taking differently: Take 10 mg by mouth at bedtime. Takes as needed) 20 tablet 0  ? cyclobenzaprine (FLEXERIL) 5 MG tablet TAKE 1 TABLET BY MOUTH EVERY DAY AS NEEDED 60 tablet 1  ? dorzolamide-timolol (COSOPT) 22.3-6.8 MG/ML ophthalmic solution Place 1 drop into both eyes 2 (two) times daily.    ? latanoprost (XALATAN) 0.005 % ophthalmic solution Place 1 drop into both eyes at bedtime. 2.5 mL 1  ? levothyroxine (SYNTHROID) 175 MCG tablet Take 175 mcg by mouth every morning.    ? loratadine (CLARITIN) 10 MG tablet Take 1 tablet (10 mg total) by mouth daily. 100 tablet 3  ? Multiple Vitamins-Minerals (IMMUNE SUPPORT PO) Take by mouth.    ? pantoprazole (PROTONIX) 40 MG tablet TAKE 1 TABLET BY MOUTH EVERY DAY 90 tablet 1  ? predniSONE (STERAPRED UNI-PAK 21 TAB) 10 MG (21) TBPK tablet Take by mouth daily. Take 6 tabs by mouth daily  for 2 days, then 5 tabs for 2 days, then 4 tabs for 2 days, then 3 tabs for 2 days, 2  tabs for 2 days, then 1 tab by mouth daily for 2 days 42 tablet 0  ? triamcinolone ointment (KENALOG) 0.1 % APPLY TO AFFECTED AREA TWICE A DAY 80 g 1  ? FLUoxetine (PROZAC) 40 MG capsule Take 1 capsule by mouth total 40 mg daily after work 90 capsule 1  ? levothyroxine (SYNTHROID) 150 MCG tablet Take 150 mcg by mouth every morning. Take 1 by mouth every morning    ? meloxicam (MOBIC) 15 MG tablet TAKE 1 TABLET (15 MG TOTAL) BY MOUTH DAILY. (Patient not taking: Reported on 10/29/2021) 90 tablet 1  ? OVER THE COUNTER MEDICATION Take 1 capsule by mouth daily. Ginger and tumeric daily (Patient not taking: Reported on 10/29/2021)    ? ?No facility-administered medications prior to visit.  ? ? ?ROS: ?Review of Systems  ?Constitutional:  Negative for appetite change, fatigue and unexpected weight change.  ?HENT:  Negative for congestion, nosebleeds, sneezing, sore throat and trouble swallowing.   ?Eyes:  Negative for itching and visual disturbance.  ?Respiratory:  Negative for cough.   ?Cardiovascular:  Negative for chest pain, palpitations and leg swelling.  ?Gastrointestinal:  Negative for abdominal distention, blood in stool, diarrhea and nausea.  ?Genitourinary:  Negative for frequency and hematuria.  ?Musculoskeletal:  Positive for back pain, neck pain and neck stiffness. Negative for gait  problem and joint swelling.  ?Skin:  Negative for rash.  ?Neurological:  Positive for dizziness. Negative for tremors, syncope, speech difficulty and weakness.  ?Psychiatric/Behavioral:  Negative for agitation, decreased concentration, dysphoric mood, sleep disturbance and suicidal ideas. The patient is nervous/anxious.   ? ?Objective:  ?BP 102/78 (BP Location: Left Arm, Patient Position: Sitting, Cuff Size: Large)   Pulse 67   Temp 98.3 ?F (36.8 ?C) (Oral)   Ht '6\' 5"'$  (1.956 m)   Wt 248 lb (112.5 kg)   SpO2 97%   BMI 29.41 kg/m?  ? ?BP Readings from Last 3 Encounters:  ?03/27/22 102/78  ?12/19/21 108/71  ?12/05/21 119/84   ? ? ?Wt Readings from Last 3 Encounters:  ?03/27/22 248 lb (112.5 kg)  ?12/19/21 253 lb (114.8 kg)  ?09/25/21 251 lb 9.6 oz (114.1 kg)  ? ? ?Physical Exam ?Constitutional:   ?   General: He is not in acute distress. ?   Appearance: He is well-developed.  ?   Comments: NAD  ?Eyes:  ?   Conjunctiva/sclera: Conjunctivae normal.  ?   Pupils: Pupils are equal, round, and reactive to light.  ?Neck:  ?   Thyroid: No thyromegaly.  ?   Vascular: No JVD.  ?Cardiovascular:  ?   Rate and Rhythm: Normal rate and regular rhythm.  ?   Heart sounds: Normal heart sounds. No murmur heard. ?  No friction rub. No gallop.  ?Pulmonary:  ?   Effort: Pulmonary effort is normal. No respiratory distress.  ?   Breath sounds: Normal breath sounds. No wheezing or rales.  ?Chest:  ?   Chest wall: No tenderness.  ?Abdominal:  ?   General: Bowel sounds are normal. There is no distension.  ?   Palpations: Abdomen is soft. There is no mass.  ?   Tenderness: There is no abdominal tenderness. There is no guarding or rebound.  ?Musculoskeletal:     ?   General: Tenderness present. Normal range of motion.  ?   Cervical back: Normal range of motion.  ?Lymphadenopathy:  ?   Cervical: No cervical adenopathy.  ?Skin: ?   General: Skin is warm and dry.  ?   Findings: No rash.  ?Neurological:  ?   Mental Status: He is alert and oriented to person, place, and time.  ?   Cranial Nerves: No cranial nerve deficit.  ?   Motor: No abnormal muscle tone.  ?   Coordination: Coordination normal.  ?   Gait: Gait normal.  ?   Deep Tendon Reflexes: Reflexes are normal and symmetric.  ?Psychiatric:     ?   Behavior: Behavior normal.     ?   Thought Content: Thought content normal.     ?   Judgment: Judgment normal.  ?Cervical spine is painful range of motion ? ?Lab Results  ?Component Value Date  ? WBC 7.4 04/20/2021  ? HGB 13.4 04/20/2021  ? HCT 41.1 04/20/2021  ? PLT 145.0 (L) 04/20/2021  ? GLUCOSE 80 04/20/2021  ? CHOL 196 05/04/2020  ? TRIG 84.0 05/04/2020  ? HDL  48.10 05/04/2020  ? LDLDIRECT 176.7 11/12/2013  ? LDLCALC 131 (H) 05/04/2020  ? ALT 21 04/20/2021  ? AST 20 04/20/2021  ? NA 138 04/20/2021  ? K 3.8 04/20/2021  ? CL 104 04/20/2021  ? CREATININE 0.99 04/20/2021  ? BUN 12 04/20/2021  ? CO2 26 04/20/2021  ? TSH 1.40 05/04/2020  ? PSA 0.84 05/04/2020  ? INR 1.0 06/14/2009  ?  HGBA1C 5.2 05/04/2020  ? ? ?No results found. ? ?Assessment & Plan:  ? ?Problem List Items Addressed This Visit   ? ? Cervical pain (neck)  ?  Worse ?F/u w/Dr Lorin Mercy ? ?Blue-Emu cream use 2-3 times a day ? ?Rice sock heating pad ? ?McKenzie pillow ? ?Contour pillow ?  ?  ? Erectile dysfunction  ?  Discussed ?The problem may be aggravated by SSRIs, other meds ?Try Cialis prn ?  ?  ? Epistaxis  ?  Recurrent mild -likely related to dry air ?NS spray ?ENT ref ? ?  ?  ?  ? ? ?Meds ordered this encounter  ?Medications  ? tadalafil (CIALIS) 20 MG tablet  ?  Sig: Take 0.5-1 tablets (10-20 mg total) by mouth every three (3) days as needed for erectile dysfunction.  ?  Dispense:  20 tablet  ?  Refill:  5  ?  ? ? ?Follow-up: Return in about 3 months (around 06/26/2022) for a follow-up visit. ? ?Walker Kehr, MD ?

## 2022-03-27 NOTE — Patient Instructions (Addendum)
Blue-Emu cream use 2-3 times a day ? ?Rice sock heating pad ? ?McKenzie pillow ? ?Contour pillow ? ? ?

## 2022-03-27 NOTE — Assessment & Plan Note (Addendum)
Worse ?F/u w/Dr Lorin Mercy ? ?Blue-Emu cream use 2-3 times a day ? ?Rice sock heating pad ? ?McKenzie pillow ? ?Contour pillow ?

## 2022-03-27 NOTE — Assessment & Plan Note (Addendum)
Recurrent mild -likely related to dry air ?NS spray ?ENT ref ?

## 2022-03-27 NOTE — Assessment & Plan Note (Addendum)
Discussed ?The problem may be aggravated by SSRIs, other meds ?Try Cialis prn ?

## 2022-04-01 ENCOUNTER — Other Ambulatory Visit: Payer: Self-pay | Admitting: Psychiatry

## 2022-04-01 DIAGNOSIS — F330 Major depressive disorder, recurrent, mild: Secondary | ICD-10-CM

## 2022-04-01 DIAGNOSIS — F411 Generalized anxiety disorder: Secondary | ICD-10-CM

## 2022-04-04 ENCOUNTER — Ambulatory Visit: Payer: BC Managed Care – PPO | Admitting: Mental Health

## 2022-04-29 ENCOUNTER — Ambulatory Visit: Payer: BC Managed Care – PPO | Admitting: Psychiatry

## 2022-05-28 ENCOUNTER — Ambulatory Visit (INDEPENDENT_AMBULATORY_CARE_PROVIDER_SITE_OTHER): Payer: BC Managed Care – PPO | Admitting: Psychiatry

## 2022-05-28 ENCOUNTER — Encounter: Payer: Self-pay | Admitting: Psychiatry

## 2022-05-28 DIAGNOSIS — F411 Generalized anxiety disorder: Secondary | ICD-10-CM

## 2022-05-28 DIAGNOSIS — F330 Major depressive disorder, recurrent, mild: Secondary | ICD-10-CM | POA: Diagnosis not present

## 2022-05-28 MED ORDER — CLONAZEPAM 1 MG PO TABS: 1.0000 mg | ORAL_TABLET | Freq: Two times a day (BID) | ORAL | 1 refills | Status: DC | PRN

## 2022-05-28 MED ORDER — FLUOXETINE HCL 40 MG PO CAPS: ORAL_CAPSULE | ORAL | 1 refills | Status: DC

## 2022-05-28 NOTE — Progress Notes (Signed)
Elijah Ashley [id number] November 06, 1966 56 y.o.  Subjective:   Patient ID:  Elijah Ashley is a 56 y.o. (DOB [birth year]) male.  Chief Complaint:  Chief Complaint  Patient presents with   Follow-up    Anxiety, depression, insomnia    HPI Elijah Ashley presents to the office today for follow-up of anxiety and depression.   He would like to try to reduce Klonopin if possible. He usually takes Klonopin once daily and then an additional tab as needed. He typically uses it when he first gets off work to be able to get to sleep. He reports that he has had chronic difficulty with sleep and sleeps for shorter interval. He is using yoga and meditation in the morning to help manage anxiety. 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 He reports some anxiety in response to financial stressors. He reports that he has some anxious thoughts and is able to work through this. He reports that he focuses on what he can control and what he cannot. Denies any physical manifestations of anxiety. He has some mild depression and this resolved. He reports some "highs and lows... which might be normal for the human rhythm." He reports that lows are not severe. Denies h/o manic s/s. Denies impulsivity or risky behavior. Energy and motivation have been ok. Appetite has been ok. Concentration is ok. Denies SI.   He is now working nights every night now. Wife is currently not working and he has full financial responsibility for the household.   Has been enjoying playing bass guitar. He has been reconnecting with people that he enjoys.  Klonopin last filled 04/14/22.   Past Psychiatric Medication Trials: Prozac Klonopin Nuvigil- Took when he initially started working nights Wellbutrin XL Depakote Latuda- "felt like a zombie" and shuffling gait Trazodone- Headaches Ambien    PHQ2-9    [place] Office Visit from  03/27/2022 in [place] at [employer] Visit from 03/20/2017 in [place]  PHQ-2 Total Score 0 0  PHQ-9 Total Score 0 --      Flowsheet Row ED from 12/05/2021 in [facility]  C-SSRS RISK CATEGORY No Risk        Review of Systems:  Review of Systems  Respiratory:  Positive for wheezing.   Musculoskeletal:  Positive for arthralgias. Negative for gait problem.  Neurological:  Positive for headaches.       Has been identifying triggers to headaches  Psychiatric/Behavioral:         Please refer to HPI    Medications: I have reviewed the patient's current medications.  Current Outpatient Medications  Medication Sig Dispense Refill   B Complex Vitamins (B COMPLEX-B12 PO) Take by mouth.     Cholecalciferol (VITAMIN D3) 2000 units capsule Take 1 capsule (2,000 Units total) by mouth daily. 100 capsule 3   cyclobenzaprine (FLEXERIL) 10 MG tablet Take 1 tablet (10 mg total) by mouth at bedtime. (Patient taking differently: Take 10 mg by mouth at bedtime. Takes as needed) 20 tablet 0   dorzolamide-timolol (COSOPT) 22.3-6.8 MG/ML ophthalmic solution Place 1 drop into both eyes 2 (two) times daily.     latanoprost (XALATAN) 0.005 % ophthalmic solution Place 1 drop into both eyes at bedtime. 2.5 mL 1   levothyroxine (SYNTHROID) 150 MCG tablet Take 150 mcg by mouth every morning. Takes 1.5 tabs on Sundays  loratadine (CLARITIN) 10 MG tablet Take 1 tablet (10 mg total) by mouth daily. 100 tablet 3   albuterol (VENTOLIN HFA) 108 (90 Base) MCG/ACT inhaler TAKE 2 PUFFS BY MOUTH EVERY 6 HOURS AS NEEDED FOR WHEEZE OR SHORTNESS OF BREATH 18 each 5   [START ON 07/07/2022] clonazePAM (KLONOPIN) 1 MG tablet Take 1 tablet (1 mg total) by mouth 2 (two) times daily as needed for anxiety. 180 tablet 1   FLUoxetine (PROZAC) 40 MG capsule TAKE 1 CAPSULE BY MOUTH DAILY AFTER WORK 90 capsule 1   pantoprazole (PROTONIX) 40 MG tablet TAKE 1  TABLET BY MOUTH EVERY DAY 90 tablet 1   tadalafil (CIALIS) 20 MG tablet Take 0.5-1 tablets (10-20 mg total) by mouth every three (3) days as needed for erectile dysfunction. 20 tablet 5   No current facility-administered medications for this visit.    Medication Side Effects: None  Allergies: No Known Allergies  Past Medical History:  Diagnosis Date   Allergy    Anxiety    Depression    GERD (gastroesophageal reflux disease)    past hx- some now that comes and goes   Glaucoma (increased eye pressure)    Hemorrhoids    pt states he gets regular flares with these    Hx of adenomatous polyp of colon 10/02/2017   Hyperlipidemia    slightly elevated- working on diet and exercise, no meds    HYPOTHYROIDISM    postsurgical   LOW BACK PAIN    L4-5 degen disc on MRI - s/p ESI 09/2010   Papillary thyroid carcinoma ([place]) 06/16/09 dx   total thyroidectomy for cold nodule & Graves disease   Psoriasis    URINARY RETENTION     Past Medical History, Surgical history, Social history, and Family history were reviewed and updated as appropriate.   Please see review of systems for further details on the patient's review from today.   Objective:   Physical Exam:  There were no vitals taken for this visit.  Physical Exam Constitutional:      General: He is not in acute distress. Musculoskeletal:        General: No deformity.  Neurological:     Mental Status: He is alert and oriented to person, place, and time.     Coordination: Coordination normal.  Psychiatric:        Attention and Perception: Attention and perception normal. He does not perceive auditory or visual hallucinations.        Mood and Affect: Mood normal. Mood is not anxious or depressed. Affect is not labile, blunt, angry or inappropriate.        Speech: Speech normal.        Behavior: Behavior normal.        Thought Content: Thought content normal. Thought content is not paranoid or delusional. Thought content does not  include homicidal or suicidal ideation. Thought content does not include homicidal or suicidal plan.        Cognition and Memory: Cognition and memory normal.        Judgment: Judgment normal.     Comments: Insight intact     Lab Review:     Component Value Date/Time   NA 138 04/20/2021 1315   K 3.8 04/20/2021 1315   CL 104 04/20/2021 1315   CO2 26 04/20/2021 1315   GLUCOSE 80 04/20/2021 1315   BUN 12 04/20/2021 1315   CREATININE 0.99 04/20/2021 1315   CALCIUM 9.2 04/20/2021 1315   PROT 7.6  04/20/2021 1315   ALBUMIN 4.4 04/20/2021 1315   AST 20 04/20/2021 1315   ALT 21 04/20/2021 1315   ALKPHOS 134 (H) 04/20/2021 1315   BILITOT 0.4 04/20/2021 1315   GFRNONAA >60 09/22/2015 2215   GFRAA >60 09/22/2015 2215       Component Value Date/Time   WBC 7.4 04/20/2021 1315   RBC 4.80 04/20/2021 1315   HGB 13.4 04/20/2021 1315   HCT 41.1 04/20/2021 1315   PLT 145.0 (L) 04/20/2021 1315   MCV 85.7 04/20/2021 1315   MCH 30.2 09/22/2015 2215   MCHC 32.6 04/20/2021 1315   RDW 13.9 04/20/2021 1315   LYMPHSABS 3.2 04/20/2021 1315   MONOABS 0.9 04/20/2021 1315   EOSABS 0.2 04/20/2021 1315   BASOSABS 0.0 04/20/2021 1315    No results found for: "POCLITH", "LITHIUM"   No results found for: "PHENYTOIN", "PHENOBARB", "VALPROATE", "CBMZ"   .res Assessment: Plan:    Pt seen for 30 minutes and time spent discussing Klonopin to include long term risks and possible side effects. Discussed his questions about possibly reducing Klonopin. Recommended reducing Klonopin gradually by 0.25 mg total daily dose to minimize withdrawal s/s. Discussed that he could also attempt to reduce Klonopin by taking lowest possible effective dose. Discussed that he has some difficulty with insomnia without Klonopin and has had excessive somnolence with other medications for insomnia. 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 Discussed that Klonopin could be reduced if he decides he would like to attempt dose reduction in the future. Will continue Klonopin 1 mg po BID prn anxiety and insomnia.  Discussed diagnosis and that he does not meet diagnostic criteria of Bipolar Disorder based on reported symptoms and review of record. Will remove Bipolar Disorder from problem list.  Will continue Prozac 40 mg daily for mood and anxiety symptoms. Recommend continuing psychotherapy with Lanetta Inch, [facility].  Pt to follow-up in 6 months or sooner if clinically indicated.  Patient advised to contact office with any questions, adverse effects, or acute worsening in signs and symptoms.   Elijah Ashley was seen today for follow-up.  Diagnoses and all orders for this visit:  Generalized anxiety disorder -     clonazePAM (KLONOPIN) 1 MG tablet; Take 1 tablet (1 mg total) by mouth 2 (two) times daily as needed for anxiety. -     FLUoxetine (PROZAC) 40 MG capsule; TAKE 1 CAPSULE BY MOUTH DAILY AFTER WORK  Mild recurrent major depression (HCC) -     FLUoxetine (PROZAC) 40 MG capsule; TAKE 1 CAPSULE BY MOUTH DAILY AFTER WORK     Please see After Visit Summary for patient specific instructions.  Future Appointments  Date Time Provider [place]  06/21/2022 11:00 AM An[place], [facility] CP-CP None  11/21/2022  8:30 AM Thayer Headings, PMHNP CP-CP None    No orders of the defined types were placed in this encounter.   -------------------------------

## 2022-06-11 ENCOUNTER — Other Ambulatory Visit: Payer: Self-pay

## 2022-06-11 ENCOUNTER — Encounter: Payer: Self-pay | Admitting: Orthopaedic Surgery

## 2022-06-11 DIAGNOSIS — M545 Low back pain, unspecified: Secondary | ICD-10-CM

## 2022-06-13 ENCOUNTER — Ambulatory Visit: Payer: BC Managed Care – PPO | Admitting: Psychiatry

## 2022-06-17 ENCOUNTER — Encounter: Payer: Self-pay | Admitting: Orthopaedic Surgery

## 2022-06-21 ENCOUNTER — Ambulatory Visit: Payer: BC Managed Care – PPO | Admitting: Mental Health

## 2022-06-21 ENCOUNTER — Ambulatory Visit: Payer: BC Managed Care – PPO | Admitting: Surgery

## 2022-06-23 ENCOUNTER — Other Ambulatory Visit: Payer: BC Managed Care – PPO

## 2022-07-09 ENCOUNTER — Ambulatory Visit: Payer: BC Managed Care – PPO | Admitting: Orthopaedic Surgery

## 2022-07-15 ENCOUNTER — Ambulatory Visit: Payer: BC Managed Care – PPO | Admitting: Mental Health

## 2022-07-17 ENCOUNTER — Ambulatory Visit: Payer: BC Managed Care – PPO | Admitting: Orthopaedic Surgery

## 2022-07-27 ENCOUNTER — Other Ambulatory Visit: Payer: Self-pay | Admitting: Psychiatry

## 2022-07-27 DIAGNOSIS — F411 Generalized anxiety disorder: Secondary | ICD-10-CM

## 2022-09-11 ENCOUNTER — Ambulatory Visit (INDEPENDENT_AMBULATORY_CARE_PROVIDER_SITE_OTHER): Payer: BC Managed Care – PPO | Admitting: Mental Health

## 2022-09-11 DIAGNOSIS — F411 Generalized anxiety disorder: Secondary | ICD-10-CM | POA: Diagnosis not present

## 2022-09-11 NOTE — Progress Notes (Signed)
[facility] psychotherapy note  Name: Elijah Ashley Date: 09/11/2022 MRN: [id number] DOB: 03-15-1966 PCP: Cassandria Anger, MD  Time spent: 56 minutes  Treatment:  ind. therapy  Mental Status Exam:    Appearance:    Casual     Behavior:   Appropriate  Motor:   WNL  Speech/Language:    Clear and Coherent  Affect:   Full range   Mood:   Euthymic  Thought process:   Logical, linear, goal directed  Thought content:     WNL  Sensory/Perceptual disturbances:     none  Orientation:   x4  Attention:   Good  Concentration:   Good  Memory:   Intact  Fund of knowledge:    Consistent with age and development  Insight:     Good  Judgment:    Good  Impulse Control:   Good     Reported Symptoms:  some sadness, anxiety  Risk Assessment: Danger to Self:  No Self-injurious Behavior: No Danger to Others: No Duty to Warn:no Physical Aggression / Violence:No  Access to Firearms a concern: No  Gang Involvement:No  Patient / guardian was educated about steps to take if suicide or homicide risk level increases between visits: yes While future psychiatric events cannot be accurately predicted, the patient does not currently require acute inpatient psychiatric care and does not currently meet [facility] involuntary commitment criteria.   Subjective: Patient arrived on time for today's session.  Assess progress and relevant recent events over the past few months since his last visit.  He stated that he has been experiencing more happiness recently and going on to share details.  He stated that his daughter is doing well overall, excelling in school.  He shared enjoyable experiences with his family as well as specifically with he and his wife, trying to spend time with 1 another go on dates.  He shared how he continues to also worry about his wife's health issues specifically, vasculitis.  He stated they have no contact with his stepson after the fallout from a few months ago  that resulted in his having to leave their home.  He stated that as a result they continue to experience "peace" in the household.  He shared more history, growing up in [place], often having to cope and navigate through tumultuous conditions to ensure his safety in that community due to higher crime. He paralleled some of the feelings during that time to when having to try and manage his stepson's behaviors, feeling threatened and concerns about safety for his wife as well due to his stepson's threatening behaviors.  Provide some psychoeducation related to trauma response.  Facilitated his identifying further thoughts related to differences, insights he has learned about himself over the years and with the most recent issue with his stepson.  He identified the need also to focus on self-care, getting adequate rest, working out again etc.    Interventions: Supportive therapy, CBT    Diagnoses:    ICD-10-CM   1. Generalized anxiety disorder  F41.1         Plan: Patient is to use CBT, mindfulness and coping skills.  Utilize his support system and engaging pleasurable activities, such as playing his bass and other instruments.   Long-term goal:   Reduce overall level, frequency, and intensity of the feelings of anxiety at least 3 consecutive months per patient report.    Short-term goal:  Decrease worry about life stressors and improve coping to manage anxiety  levels Make efforts to be consistent with self-care.  Engage in interests such as playing his base and other musical instruments, adhere to engaging in meditation    Assessment of progress:  progressing   This record has been created using [employer].  Chart creation errors have been sought, but may not always have been located and corrected. Such creation errors do not reflect on the standard of medical care.   Anson [place], [facility]

## 2022-10-09 ENCOUNTER — Ambulatory Visit (INDEPENDENT_AMBULATORY_CARE_PROVIDER_SITE_OTHER): Payer: BC Managed Care – PPO | Admitting: Mental Health

## 2022-10-09 DIAGNOSIS — F411 Generalized anxiety disorder: Secondary | ICD-10-CM

## 2022-10-09 NOTE — Progress Notes (Signed)
[facility] psychotherapy note  Name: Elijah Ashley Date: 10/09/2022 MRN: [id number] DOB: 1966/11/01 PCP: Cassandria Anger, MD  Time spent: 56 minutes  Treatment:  ind. therapy  Mental Status Exam:    Appearance:    Casual     Behavior:   Appropriate  Motor:   WNL  Speech/Language:    Clear and Coherent  Affect:   Full range   Mood:   Euthymic  Thought process:   Logical, linear, goal directed  Thought content:     WNL  Sensory/Perceptual disturbances:     none  Orientation:   x4  Attention:   Good  Concentration:   Good  Memory:   Intact  Fund of knowledge:    Consistent with age and development  Insight:     Good  Judgment:    Good  Impulse Control:   Good     Reported Symptoms:  some sadness, anxiety  Risk Assessment: Danger to Self:  No Self-injurious Behavior: No Danger to Others: No Duty to Warn:no Physical Aggression / Violence:No  Access to Firearms a concern: No  Gang Involvement:No  Patient / guardian was educated about steps to take if suicide or homicide risk level increases between visits: yes While future psychiatric events cannot be accurately predicted, the patient does not currently require acute inpatient psychiatric care and does not currently meet [facility] involuntary commitment criteria.   Subjective: Patient arrived on time for today's session.  Patient shared recent events, stressors related to the relationship with his wife.  He went on to share and review how she is coping with multiple medical issues which have persisted over the last few years.  He shared some interpersonal relationship issues they have been having recently, 1 discussion they had which was stressful.  He expresses concern about her emotional state and has encouraged her to engage in treatment for herself where he feels confident she will follow through in the coming weeks.  He stated he is trying to cope and care for himself by trying to continue his  exercise workouts, get adequate rest and engage in coping skills as discussed in previous visits related to meditation and mindfulness.  Through guided discovery, he identified how he tries to reflect upon how he communicates in relationships, she more detail not only in his relationship with his wife but also other family members, his stepson specifically; he reflects on how he could have handled some situations more effectively in the past, noting his own childhood and adolescence as discussed last session.    Interventions: Supportive therapy, CBT    Diagnoses:    ICD-10-CM   1. Generalized anxiety disorder  F41.1          Plan: Patient is to use CBT, mindfulness and coping skills.  Utilize his support system and engaging pleasurable activities, such as playing his bass and other instruments.   Long-term goal:   Reduce overall level, frequency, and intensity of the feelings of anxiety at least 3 consecutive months per patient report.    Short-term goal:  Decrease worry about life stressors and improve coping to manage anxiety levels Make efforts to be consistent with self-care.  Engage in interests such as playing his base and other musical instruments, adhere to engaging in meditation    Assessment of progress:  progressing   This record has been created using [employer].  Chart creation errors have been sought, but may not always have been located and corrected. Such creation errors  do not reflect on the standard of medical care.   Anson [place], [facility]

## 2022-10-10 ENCOUNTER — Ambulatory Visit (INDEPENDENT_AMBULATORY_CARE_PROVIDER_SITE_OTHER): Payer: BC Managed Care – PPO | Admitting: Mental Health

## 2022-10-16 ENCOUNTER — Encounter: Payer: Self-pay | Admitting: Internal Medicine

## 2022-11-04 ENCOUNTER — Ambulatory Visit: Payer: BC Managed Care – PPO | Admitting: Psychiatry

## 2022-11-12 ENCOUNTER — Other Ambulatory Visit: Payer: Self-pay | Admitting: Internal Medicine

## 2022-11-21 ENCOUNTER — Encounter: Payer: Self-pay | Admitting: Psychiatry

## 2022-11-21 ENCOUNTER — Ambulatory Visit: Payer: BC Managed Care – PPO | Admitting: Mental Health

## 2022-11-21 ENCOUNTER — Ambulatory Visit (INDEPENDENT_AMBULATORY_CARE_PROVIDER_SITE_OTHER): Payer: BC Managed Care – PPO | Admitting: Psychiatry

## 2022-11-21 DIAGNOSIS — F330 Major depressive disorder, recurrent, mild: Secondary | ICD-10-CM | POA: Diagnosis not present

## 2022-11-21 DIAGNOSIS — F411 Generalized anxiety disorder: Secondary | ICD-10-CM

## 2022-11-21 MED ORDER — FLUOXETINE HCL 20 MG PO CAPS: 20.0000 mg | ORAL_CAPSULE | Freq: Every day | ORAL | 1 refills | Status: DC

## 2022-11-21 MED ORDER — FLUOXETINE HCL 40 MG PO CAPS: 40.0000 mg | ORAL_CAPSULE | Freq: Every day | ORAL | 1 refills | Status: DC

## 2022-11-21 MED ORDER — CLONAZEPAM 1 MG PO TABS: 1.0000 mg | ORAL_TABLET | Freq: Two times a day (BID) | ORAL | 1 refills | Status: DC | PRN

## 2022-11-21 NOTE — Progress Notes (Signed)
[facility] psychotherapy note  Name: Elijah Ashley Date: 11/21/2022 MRN: [id number] DOB: Feb 09, 1966 PCP: Cassandria Anger, MD  Time spent: 45 minutes  Treatment:  ind. therapy  Mental Status Exam:    Appearance:    Casual     Behavior:   Appropriate  Motor:   WNL  Speech/Language:    Clear and Coherent  Affect:   Full range   Mood:   Euthymic  Thought process:   Logical, linear, goal directed  Thought content:     WNL  Sensory/Perceptual disturbances:     none  Orientation:   x4  Attention:   Good  Concentration:   Good  Memory:   Intact  Fund of knowledge:    Consistent with age and development  Insight:     Good  Judgment:    Good  Impulse Control:   Good     Reported Symptoms:  some sadness, anxiety  Risk Assessment: Danger to Self:  No Self-injurious Behavior: No Danger to Others: No Duty to Warn:no Physical Aggression / Violence:No  Access to Firearms a concern: No  Gang Involvement:No  Patient / guardian was educated about steps to take if suicide or homicide risk level increases between visits: yes While future psychiatric events cannot be accurately predicted, the patient does not currently require acute inpatient psychiatric care and does not currently meet [facility] involuntary commitment criteria.   Subjective: Patient arrived on time for today's session.  Patient shared recent events in progress.  He stated that he experienced more anxiety recently as he and his family were able to go on a trip but upon the return his wife became ill.  He stated that it was a head cold and she had considerable congestion which elevated his anxiety as he was worried about her health.  His wife struggles with other complex medical conditions chronically which adds to patient's daily anxiety levels.  He shared his concerns related to the seriousness of her conditions and how he worries about their future.  Explored collaboratively ways to cope, thoughts  with which he tries to focus on to redirect his thinking.  He shared positive experiences overall over the Thanksgiving holiday with family, looks forward to Christmas time while also working increased hours currently.    Interventions: Supportive therapy, CBT    Diagnoses:    ICD-10-CM   1. Generalized anxiety disorder  F41.1           Plan: Patient is to use CBT, mindfulness and coping skills.  Utilize his support system and engaging pleasurable activities, such as playing his bass and other instruments.   Long-term goal:   Reduce overall level, frequency, and intensity of the feelings of anxiety at least 3 consecutive months per patient report.    Short-term goal:  Decrease worry about life stressors and improve coping to manage anxiety levels Make efforts to be consistent with self-care.  Engage in interests such as playing his base and other musical instruments, adhere to engaging in meditation    Assessment of progress:  progressing   This record has been created using [employer].  Chart creation errors have been sought, but may not always have been located and corrected. Such creation errors do not reflect on the standard of medical care.   Anson [place], [facility]
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
## 2022-11-30 ENCOUNTER — Other Ambulatory Visit: Payer: Self-pay | Admitting: Internal Medicine

## 2023-01-16 ENCOUNTER — Ambulatory Visit (INDEPENDENT_AMBULATORY_CARE_PROVIDER_SITE_OTHER): Payer: BC Managed Care – PPO | Admitting: Mental Health

## 2023-01-31 ENCOUNTER — Encounter: Payer: Self-pay | Admitting: Internal Medicine

## 2023-01-31 DIAGNOSIS — E785 Hyperlipidemia, unspecified: Secondary | ICD-10-CM

## 2023-01-31 DIAGNOSIS — Z125 Encounter for screening for malignant neoplasm of prostate: Secondary | ICD-10-CM

## 2023-01-31 DIAGNOSIS — Z0000 Encounter for general adult medical examination without abnormal findings: Secondary | ICD-10-CM

## 2023-01-31 DIAGNOSIS — E538 Deficiency of other specified B group vitamins: Secondary | ICD-10-CM

## 2023-01-31 DIAGNOSIS — E559 Vitamin D deficiency, unspecified: Secondary | ICD-10-CM

## 2023-02-03 ENCOUNTER — Other Ambulatory Visit: Payer: BC Managed Care – PPO

## 2023-02-03 ENCOUNTER — Ambulatory Visit (INDEPENDENT_AMBULATORY_CARE_PROVIDER_SITE_OTHER): Payer: BC Managed Care – PPO

## 2023-02-03 ENCOUNTER — Encounter: Payer: Self-pay | Admitting: Internal Medicine

## 2023-02-03 ENCOUNTER — Ambulatory Visit (INDEPENDENT_AMBULATORY_CARE_PROVIDER_SITE_OTHER): Payer: BC Managed Care – PPO | Admitting: Sports Medicine

## 2023-02-03 ENCOUNTER — Ambulatory Visit: Payer: BC Managed Care – PPO | Admitting: Internal Medicine

## 2023-02-03 VITALS — BP 120/80 | HR 72 | Ht 77.0 in | Wt 254.0 lb

## 2023-02-03 VITALS — BP 110/78 | HR 74 | Temp 98.2°F | Ht 77.0 in | Wt 254.0 lb

## 2023-02-03 DIAGNOSIS — E785 Hyperlipidemia, unspecified: Secondary | ICD-10-CM

## 2023-02-03 DIAGNOSIS — Z23 Encounter for immunization: Secondary | ICD-10-CM

## 2023-02-03 DIAGNOSIS — M25561 Pain in right knee: Secondary | ICD-10-CM

## 2023-02-03 DIAGNOSIS — E890 Postprocedural hypothyroidism: Secondary | ICD-10-CM

## 2023-02-03 DIAGNOSIS — F411 Generalized anxiety disorder: Secondary | ICD-10-CM

## 2023-02-03 DIAGNOSIS — J309 Allergic rhinitis, unspecified: Secondary | ICD-10-CM | POA: Diagnosis not present

## 2023-02-03 DIAGNOSIS — Z0000 Encounter for general adult medical examination without abnormal findings: Secondary | ICD-10-CM | POA: Diagnosis not present

## 2023-02-03 DIAGNOSIS — G8929 Other chronic pain: Secondary | ICD-10-CM

## 2023-02-03 DIAGNOSIS — M1711 Unilateral primary osteoarthritis, right knee: Secondary | ICD-10-CM

## 2023-02-03 LAB — URINALYSIS
Bilirubin Urine: NEGATIVE
Hgb urine dipstick: NEGATIVE
Ketones, ur: NEGATIVE
Leukocytes,Ua: NEGATIVE
Nitrite: NEGATIVE
Specific Gravity, Urine: 1.015 (ref 1.000–1.030)
Total Protein, Urine: NEGATIVE
Urine Glucose: NEGATIVE
Urobilinogen, UA: 0.2 (ref 0.0–1.0)
pH: 7 (ref 5.0–8.0)

## 2023-02-03 LAB — CBC WITH DIFFERENTIAL/PLATELET
Basophils Absolute: 0 10*3/uL (ref 0.0–0.1)
Basophils Relative: 0.7 % (ref 0.0–3.0)
Eosinophils Absolute: 0.2 10*3/uL (ref 0.0–0.7)
Eosinophils Relative: 2.6 % (ref 0.0–5.0)
HCT: 42.1 % (ref 39.0–52.0)
Hemoglobin: 14 g/dL (ref 13.0–17.0)
Lymphocytes Relative: 43.4 % (ref 12.0–46.0)
Lymphs Abs: 2.8 10*3/uL (ref 0.7–4.0)
MCHC: 33.1 g/dL (ref 30.0–36.0)
MCV: 88 fl (ref 78.0–100.0)
Monocytes Absolute: 0.6 10*3/uL (ref 0.1–1.0)
Monocytes Relative: 9.5 % (ref 3.0–12.0)
Neutro Abs: 2.8 10*3/uL (ref 1.4–7.7)
Neutrophils Relative %: 43.8 % (ref 43.0–77.0)
Platelets: 133 10*3/uL — ABNORMAL LOW (ref 150.0–400.0)
RBC: 4.79 Mil/uL (ref 4.22–5.81)
RDW: 14.4 % (ref 11.5–15.5)
WBC: 6.5 10*3/uL (ref 4.0–10.5)

## 2023-02-03 LAB — LIPID PANEL
Cholesterol: 234 mg/dL — ABNORMAL HIGH (ref 0–200)
HDL: 44.9 mg/dL (ref >39.00)
LDL Cholesterol: 163 mg/dL — ABNORMAL HIGH (ref 0–99)
NonHDL: 189.12
Total CHOL/HDL Ratio: 5
Triglycerides: 129 mg/dL (ref 0.0–149.0)
VLDL: 25.8 mg/dL (ref 0.0–40.0)

## 2023-02-03 LAB — COMPREHENSIVE METABOLIC PANEL
ALT: 25 U/L (ref 0–53)
AST: 24 U/L (ref 0–37)
Albumin: 4.4 g/dL (ref 3.5–5.2)
Alkaline Phosphatase: 93 U/L (ref 39–117)
BUN: 13 mg/dL (ref 6–23)
CO2: 28 mEq/L (ref 19–32)
Calcium: 9.4 mg/dL (ref 8.4–10.5)
Chloride: 102 mEq/L (ref 96–112)
Creatinine, Ser: 1.08 mg/dL (ref 0.40–1.50)
GFR: 77.3 mL/min (ref >60.00)
Glucose, Bld: 84 mg/dL (ref 70–99)
Potassium: 4.6 mEq/L (ref 3.5–5.1)
Sodium: 137 mEq/L (ref 135–145)
Total Bilirubin: 0.9 mg/dL (ref 0.2–1.2)
Total Protein: 7.2 g/dL (ref 6.0–8.3)

## 2023-02-03 LAB — TSH: TSH: 2.94 u[IU]/mL (ref 0.35–5.50)

## 2023-02-03 LAB — PSA: PSA: 0.62 ng/mL (ref 0.10–4.00)

## 2023-02-03 MED ORDER — MELOXICAM 15 MG PO TABS: 15.0000 mg | ORAL_TABLET | Freq: Every day | ORAL | 1 refills | Status: DC

## 2023-02-03 MED ORDER — PANTOPRAZOLE SODIUM 40 MG PO TBEC: 40.0000 mg | DELAYED_RELEASE_TABLET | Freq: Every day | ORAL | 3 refills | Status: DC

## 2023-02-03 NOTE — Progress Notes (Signed)
Subjective:  Patient ID: Elijah Ashley, male    DOB: 1965/12/23  Age: 57 y.o. MRN: [id number]  CC: No chief complaint on file.   HPI Elijah Ashley presents for a well exam  Outpatient Medications Prior to Visit  Medication Sig Dispense Refill   albuterol (VENTOLIN HFA) 108 (90 Base) MCG/ACT inhaler TAKE 2 PUFFS BY MOUTH EVERY 6 HOURS AS NEEDED FOR WHEEZE OR SHORTNESS OF BREATH 18 each 5   B Complex Vitamins (B COMPLEX-B12 PO) Take by mouth.     Cholecalciferol (VITAMIN D3) 2000 units capsule Take 1 capsule (2,000 Units total) by mouth daily. 100 capsule 3   clonazePAM (KLONOPIN) 1 MG tablet Take 1 tablet (1 mg total) by mouth 2 (two) times daily as needed for anxiety. 180 tablet 1   cyclobenzaprine (FLEXERIL) 10 MG tablet Take 1 tablet (10 mg total) by mouth at bedtime. (Patient taking differently: Take 10 mg by mouth at bedtime. Takes as needed) 20 tablet 0   dorzolamide-timolol (COSOPT) 22.3-6.8 MG/ML ophthalmic solution Place 1 drop into both eyes 2 (two) times daily.     fexofenadine (ALLEGRA) 180 MG tablet TAKE 1 TABLET BY MOUTH EVERY DAY 90 tablet 3   FLUoxetine (PROZAC) 40 MG capsule Take 1 capsule (40 mg total) by mouth daily. TAKE WITH A 20 MG CAPSULE TO EQUAL TOTAL DAILY DOSE OF 60 MG 90 capsule 1   latanoprost (XALATAN) 0.005 % ophthalmic solution Place 1 drop into both eyes at bedtime. 2.5 mL 1   levothyroxine (SYNTHROID) 150 MCG tablet Take 150 mcg by mouth every morning. Takes 1.5 tabs on Sundays     loratadine (CLARITIN) 10 MG tablet Take 1 tablet (10 mg total) by mouth daily. 100 tablet 3   pantoprazole (PROTONIX) 40 MG tablet TAKE 1 TABLET BY MOUTH EVERY DAY 90 tablet 1   Travoprost, BAK Free, (TRAVATAN Z) 0.004 % SOLN ophthalmic solution 1 drop into affected eye every evening Ophthalmic Once a day     FLUoxetine (PROZAC) 20 MG capsule Take 1 capsule (20 mg total) by mouth daily. 90 capsule 1   meloxicam (MOBIC) 15 MG tablet TAKE 1 TABLET (15 MG TOTAL) BY MOUTH  DAILY. 90 tablet 1   tadalafil (CIALIS) 20 MG tablet Take 0.5-1 tablets (10-20 mg total) by mouth every three (3) days as needed for erectile dysfunction. 20 tablet 5   No facility-administered medications prior to visit.    ROS: Review of Systems  Constitutional:  Negative for appetite change, fatigue and unexpected weight change.  HENT:  Negative for congestion, nosebleeds, sneezing, sore throat and trouble swallowing.   Eyes:  Negative for itching and visual disturbance.  Respiratory:  Negative for cough.   Cardiovascular:  Negative for chest pain, palpitations and leg swelling.  Gastrointestinal:  Negative for abdominal distention, blood in stool, diarrhea and nausea.  Genitourinary:  Negative for frequency and hematuria.  Musculoskeletal:  Negative for back pain, gait problem, joint swelling and neck pain.  Skin:  Negative for rash.  Neurological:  Positive for headaches. Negative for dizziness, tremors, speech difficulty and weakness.  Psychiatric/Behavioral:  Negative for agitation, dysphoric mood and sleep disturbance. The patient is not nervous/anxious.     Objective:  BP 110/78 (BP Location: Left Arm, Patient Position: Sitting, Cuff Size: Normal)   Pulse 74   Temp 98.2 F (36.8 C) (Oral)   Ht 6' 5"$  (1.956 m)   Wt 254 lb (115.2 kg)   SpO2 98%   BMI 30.12 kg/m  BP Readings from Last 3 Encounters:  02/03/23 110/78  03/27/22 102/78  12/19/21 108/71    Wt Readings from Last 3 Encounters:  02/03/23 254 lb (115.2 kg)  03/27/22 248 lb (112.5 kg)  12/19/21 253 lb (114.8 kg)    Physical Exam Constitutional:      General: He is not in acute distress.    Appearance: He is well-developed.     Comments: NAD  HENT:     Nose: Congestion present.  Eyes:     Conjunctiva/sclera: Conjunctivae normal.     Pupils: Pupils are equal, round, and reactive to light.  Neck:     Thyroid: No thyromegaly.     Vascular: No JVD.  Cardiovascular:     Rate and Rhythm: Normal rate  and regular rhythm.     Heart sounds: Normal heart sounds. No murmur heard.    No friction rub. No gallop.  Pulmonary:     Effort: Pulmonary effort is normal. No respiratory distress.     Breath sounds: Normal breath sounds. No wheezing or rales.  Chest:     Chest wall: No tenderness.  Abdominal:     General: Bowel sounds are normal. There is no distension.     Palpations: Abdomen is soft. There is no mass.     Tenderness: There is no abdominal tenderness. There is no guarding or rebound.  Musculoskeletal:        General: No tenderness. Normal range of motion.     Cervical back: Normal range of motion.  Lymphadenopathy:     Cervical: No cervical adenopathy.  Skin:    General: Skin is warm and dry.     Findings: No rash.  Neurological:     Mental Status: He is alert and oriented to person, place, and time.     Cranial Nerves: No cranial nerve deficit.     Motor: No abnormal muscle tone.     Coordination: Coordination normal.     Gait: Gait normal.     Deep Tendon Reflexes: Reflexes are normal and symmetric.  Psychiatric:        Behavior: Behavior normal.        Thought Content: Thought content normal.        Judgment: Judgment normal.   Rectal per GI  Lab Results  Component Value Date   WBC 7.4 04/20/2021   HGB 13.4 04/20/2021   HCT 41.1 04/20/2021   PLT 145.0 (L) 04/20/2021   GLUCOSE 80 04/20/2021   CHOL 196 05/04/2020   TRIG 84.0 05/04/2020   HDL 48.10 05/04/2020   LDLDIRECT 176.7 11/12/2013   LDLCALC 131 (H) 05/04/2020   ALT 21 04/20/2021   AST 20 04/20/2021   NA 138 04/20/2021   K 3.8 04/20/2021   CL 104 04/20/2021   CREATININE 0.99 04/20/2021   BUN 12 04/20/2021   CO2 26 04/20/2021   TSH 1.40 05/04/2020   PSA 0.84 05/04/2020   INR 1.0 06/14/2009   HGBA1C 5.2 05/04/2020    No results found.  Assessment & Plan:   Problem List Items Addressed This Visit       Respiratory   Allergic rhinitis    NS Spray        Endocrine   Hypothyroidism     Post-thyroidectomy On Levothroid      Relevant Orders   TSH   Urinalysis   CBC with Differential/Platelet   Lipid panel   PSA   Comprehensive metabolic panel     Other   Well  adult exam - Primary     We discussed age appropriate health related issues, including available/recomended screening tests and vaccinations. Labs were ordered to be later reviewed . All questions were answered. We discussed one or more of the following - seat belt use, use of sunscreen/sun exposure exercise, fall risk reduction, second hand smoke exposure, firearm use and storage, seat belt use, a need for adhering to healthy diet and exercise. Labs were ordered.  All questions were answered. 2022 Coronary calcium CT score - 0 Colon 2018, due in 2024 Dr Carlean Purl      Relevant Orders   TSH   Urinalysis   CBC with Differential/Platelet   Lipid panel   PSA   Comprehensive metabolic panel   Generalized anxiety disorder    On Prozac      Relevant Orders   TSH   Urinalysis   CBC with Differential/Platelet   Lipid panel   PSA   Comprehensive metabolic panel   Dyslipidemia    Check labs         Meds ordered this encounter  Medications   meloxicam (MOBIC) 15 MG tablet    Sig: Take 1 tablet (15 mg total) by mouth daily.    Dispense:  90 tablet    Refill:  1      Follow-up: Return in about 6 months (around 08/04/2023) for a follow-up visit.  Walker Kehr, MD

## 2023-02-03 NOTE — Assessment & Plan Note (Signed)
Post-thyroidectomy On Levothroid

## 2023-02-03 NOTE — Progress Notes (Signed)
Benito Mccreedy D.[place] [facility] [place] Phone: [phone]   Assessment and Plan:     1. Chronic pain of right knee 2. Primary osteoarthritis of right knee  -Chronic with exacerbation, initial sports medicine visit - Most consistent with flare of osteoarthritis based on HPI, physical exam, x-ray imaging - X-ray obtained in clinic.  My interpretation: No acute fracture or dislocation.  Decreased joint space in medial and patellofemoral compartments, with cortical irregularities over the patella, worse on the lateral side compared to medial. - Patient has been using meloxicam 7.5 mg intermittently with limited to no relief.  We will increase to meloxicam 15 mg daily for the next 2 weeks until reevaluated - Discussed that ideally we would perform intra-articular CSI at today's visit, however patient had a shingles vaccine earlier today.  Having a CSI immediately following a shingles vaccine could in theory decrease effectiveness of vaccine, so we would not perform CSI today, though it can be performed at 2-week follow-up visit.  Pertinent previous records reviewed include none   Follow Up: 2 weeks for reevaluation.  If no improvement, would consider intra-articular CSI at that time   Subjective:   I, Elijah Ashley, am serving as a Education administrator for Doctor Glennon Mac  Chief Complaint: right knee pain   HPI:   02/03/23 Patient is a 57 year old male complaining of right knee pain. Patient states he has pain when he sits in a bent position, pain is on the outside of the knee, has been using voltaren gel , been going on for a coupe of months, no MOI , he drives trucks for [employer], no radiating pain , notes numbness and tingling, intermittent tylenol , he has been taking meloxicam for a while and doesn't know if it helps   Relevant Historical Information:   Hypothyroidism  Additional pertinent review of systems negative.   Current  Outpatient Medications:    albuterol (VENTOLIN HFA) 108 (90 Base) MCG/ACT inhaler, TAKE 2 PUFFS BY MOUTH EVERY 6 HOURS AS NEEDED FOR WHEEZE OR SHORTNESS OF BREATH, Disp: 18 each, Rfl: 5   B Complex Vitamins (B COMPLEX-B12 PO), Take by mouth., Disp: , Rfl:    Cholecalciferol (VITAMIN D3) 2000 units capsule, Take 1 capsule (2,000 Units total) by mouth daily., Disp: 100 capsule, Rfl: 3   clonazePAM (KLONOPIN) 1 MG tablet, Take 1 tablet (1 mg total) by mouth 2 (two) times daily as needed for anxiety., Disp: 180 tablet, Rfl: 1   cyclobenzaprine (FLEXERIL) 10 MG tablet, Take 1 tablet (10 mg total) by mouth at bedtime. (Patient taking differently: Take 10 mg by mouth at bedtime. Takes as needed), Disp: 20 tablet, Rfl: 0   dorzolamide-timolol (COSOPT) 22.3-6.8 MG/ML ophthalmic solution, Place 1 drop into both eyes 2 (two) times daily., Disp: , Rfl:    fexofenadine (ALLEGRA) 180 MG tablet, TAKE 1 TABLET BY MOUTH EVERY DAY, Disp: 90 tablet, Rfl: 3   FLUoxetine (PROZAC) 40 MG capsule, Take 1 capsule (40 mg total) by mouth daily. TAKE WITH A 20 MG CAPSULE TO EQUAL TOTAL DAILY DOSE OF 60 MG, Disp: 90 capsule, Rfl: 1   latanoprost (XALATAN) 0.005 % ophthalmic solution, Place 1 drop into both eyes at bedtime., Disp: 2.5 mL, Rfl: 1   levothyroxine (SYNTHROID) 150 MCG tablet, Take 150 mcg by mouth every morning. Takes 1.5 tabs on Sundays, Disp: , Rfl:    loratadine (CLARITIN) 10 MG tablet, Take 1 tablet (10 mg total) by  mouth daily., Disp: 100 tablet, Rfl: 3   meloxicam (MOBIC) 15 MG tablet, Take 1 tablet (15 mg total) by mouth daily., Disp: 90 tablet, Rfl: 1   pantoprazole (PROTONIX) 40 MG tablet, Take 1 tablet (40 mg total) by mouth daily., Disp: 90 tablet, Rfl: 3   Travoprost, BAK Free, (TRAVATAN Z) 0.004 % SOLN ophthalmic solution, 1 drop into affected eye every evening Ophthalmic Once a day, Disp: , Rfl:    tadalafil (CIALIS) 20 MG tablet, Take 0.5-1 tablets (10-20 mg total) by mouth every three (3) days as  needed for erectile dysfunction., Disp: 20 tablet, Rfl: 5   Objective:     Vitals:   02/03/23 0945  BP: 120/80  Pulse: 72  SpO2: 99%  Weight: 254 lb (115.2 kg)  Height: 6' 5"$  (1.956 m)      Body mass index is 30.12 kg/m.    Physical Exam:    General:  awake, alert oriented, no acute distress nontoxic Skin: no suspicious lesions or rashes Neuro:sensation intact and strength 5/5 with no deficits, no atrophy, normal muscle tone Psych: No signs of anxiety, depression or other mood disorder  Right knee: Crepitus bilaterally No swelling No deformity Neg fluid wave, joint milking ROM Flex 110, Ext 0 TTP lateral femoral condyle NTTP over the quad tendon, medial fem condyle,  patella, plica, patella tendon, tibial tuberostiy, fibular head, posterior fossa, pes anserine bursa, gerdy's tubercle, medial jt line, lateral jt line Neg anterior and posterior drawer Neg lachman Neg sag sign Negative varus stress Negative valgus stress Negative McMurray Negative Thessaly  Gait normal    Electronically signed by:  Benito Mccreedy D.[facility] 10:20 AM 02/03/23

## 2023-02-03 NOTE — Assessment & Plan Note (Signed)
NS Spray

## 2023-02-03 NOTE — Assessment & Plan Note (Signed)
On Prozac

## 2023-02-03 NOTE — Assessment & Plan Note (Addendum)
  We discussed age appropriate health related issues, including available/recomended screening tests and vaccinations. Labs were ordered to be later reviewed . All questions were answered. We discussed one or more of the following - seat belt use, use of sunscreen/sun exposure exercise, fall risk reduction, second hand smoke exposure, firearm use and storage, seat belt use, a need for adhering to healthy diet and exercise. Labs were ordered.  All questions were answered. 2022 Coronary calcium CT score - 0 Colon 2018, due in 2024 Dr Carlean Purl

## 2023-02-03 NOTE — Assessment & Plan Note (Signed)
Check labs 

## 2023-02-03 NOTE — Patient Instructions (Addendum)
Good to see you  Knee HEP - Start meloxicam 15 mg daily x2 weeks.  If still having pain after 2 weeks, complete 3rd-week of meloxicam. May use remaining meloxicam as needed once daily for pain control.  Do not to use additional NSAIDs while taking meloxicam.  May use Tylenol [phone] mg 2 to 3 times a day for breakthrough pain. 2 week follow up

## 2023-02-05 ENCOUNTER — Encounter: Payer: Self-pay | Admitting: Gastroenterology

## 2023-02-15 ENCOUNTER — Other Ambulatory Visit: Payer: Self-pay | Admitting: Psychiatry

## 2023-02-15 DIAGNOSIS — F411 Generalized anxiety disorder: Secondary | ICD-10-CM

## 2023-02-19 NOTE — Progress Notes (Unsigned)
Benito Mccreedy D.[place] Phone: [phone]   Assessment and Plan:     There are no diagnoses linked to this encounter.  ***   Pertinent previous records reviewed include ***   Follow Up: ***     Subjective:   I, Jordy Hewins, am serving as a Education administrator for Doctor Glennon Mac   Chief Complaint: right knee pain    HPI:    02/03/23 Patient is a 57 year old male complaining of right knee pain. Patient states he has pain when he sits in a bent position, pain is on the outside of the knee, has been using voltaren gel , been going on for a coupe of months, no MOI , he drives trucks for [employer], no radiating pain , notes numbness and tingling, intermittent tylenol , he has been taking meloxicam for a while and doesn't know if it helps   02/20/2023 Patient states    Relevant Historical Information:   Hypothyroidism  Additional pertinent review of systems negative.   Current Outpatient Medications:    albuterol (VENTOLIN HFA) 108 (90 Base) MCG/ACT inhaler, TAKE 2 PUFFS BY MOUTH EVERY 6 HOURS AS NEEDED FOR WHEEZE OR SHORTNESS OF BREATH, Disp: 18 each, Rfl: 5   B Complex Vitamins (B COMPLEX-B12 PO), Take by mouth., Disp: , Rfl:    Cholecalciferol (VITAMIN D3) 2000 units capsule, Take 1 capsule (2,000 Units total) by mouth daily., Disp: 100 capsule, Rfl: 3   clonazePAM (KLONOPIN) 1 MG tablet, Take 1 tablet (1 mg total) by mouth 2 (two) times daily as needed for anxiety., Disp: 180 tablet, Rfl: 1   cyclobenzaprine (FLEXERIL) 10 MG tablet, Take 1 tablet (10 mg total) by mouth at bedtime. (Patient taking differently: Take 10 mg by mouth at bedtime. Takes as needed), Disp: 20 tablet, Rfl: 0   dorzolamide-timolol (COSOPT) 22.3-6.8 MG/ML ophthalmic solution, Place 1 drop into both eyes 2 (two) times daily., Disp: , Rfl:    fexofenadine (ALLEGRA) 180 MG tablet, TAKE 1 TABLET BY MOUTH EVERY DAY, Disp: 90 tablet, Rfl:  3   FLUoxetine (PROZAC) 40 MG capsule, Take 1 capsule (40 mg total) by mouth daily. TAKE WITH A 20 MG CAPSULE TO EQUAL TOTAL DAILY DOSE OF 60 MG, Disp: 90 capsule, Rfl: 1   latanoprost (XALATAN) 0.005 % ophthalmic solution, Place 1 drop into both eyes at bedtime., Disp: 2.5 mL, Rfl: 1   levothyroxine (SYNTHROID) 150 MCG tablet, Take 150 mcg by mouth every morning. Takes 1.5 tabs on Sundays, Disp: , Rfl:    loratadine (CLARITIN) 10 MG tablet, Take 1 tablet (10 mg total) by mouth daily., Disp: 100 tablet, Rfl: 3   meloxicam (MOBIC) 15 MG tablet, Take 1 tablet (15 mg total) by mouth daily., Disp: 90 tablet, Rfl: 1   pantoprazole (PROTONIX) 40 MG tablet, Take 1 tablet (40 mg total) by mouth daily., Disp: 90 tablet, Rfl: 3   tadalafil (CIALIS) 20 MG tablet, Take 0.5-1 tablets (10-20 mg total) by mouth every three (3) days as needed for erectile dysfunction., Disp: 20 tablet, Rfl: 5   Travoprost, BAK Free, (TRAVATAN Z) 0.004 % SOLN ophthalmic solution, 1 drop into affected eye every evening Ophthalmic Once a day, Disp: , Rfl:    Objective:     There were no vitals filed for this visit.    There is no height or weight on file to calculate BMI.    Physical Exam:    ***  Electronically signed by:  Benito Mccreedy D.[facility] 8:37 AM 02/19/23

## 2023-02-20 ENCOUNTER — Ambulatory Visit (INDEPENDENT_AMBULATORY_CARE_PROVIDER_SITE_OTHER): Payer: BC Managed Care – PPO | Admitting: Sports Medicine

## 2023-02-20 VITALS — BP 118/78 | HR 84 | Ht 77.0 in | Wt 258.0 lb

## 2023-02-20 DIAGNOSIS — M1711 Unilateral primary osteoarthritis, right knee: Secondary | ICD-10-CM

## 2023-02-20 DIAGNOSIS — M25561 Pain in right knee: Secondary | ICD-10-CM

## 2023-02-20 DIAGNOSIS — G8929 Other chronic pain: Secondary | ICD-10-CM | POA: Diagnosis not present

## 2023-02-20 NOTE — Patient Instructions (Addendum)
Good to see you   

## 2023-02-27 ENCOUNTER — Ambulatory Visit: Payer: BC Managed Care – PPO | Admitting: Psychiatry

## 2023-02-27 ENCOUNTER — Encounter: Payer: Self-pay | Admitting: Psychiatry

## 2023-02-27 DIAGNOSIS — F330 Major depressive disorder, recurrent, mild: Secondary | ICD-10-CM

## 2023-02-27 DIAGNOSIS — G4726 Circadian rhythm sleep disorder, shift work type: Secondary | ICD-10-CM

## 2023-02-27 DIAGNOSIS — F411 Generalized anxiety disorder: Secondary | ICD-10-CM | POA: Diagnosis not present

## 2023-02-27 MED ORDER — FLUOXETINE HCL 40 MG PO CAPS: 40.0000 mg | ORAL_CAPSULE | Freq: Every day | ORAL | 0 refills | Status: DC

## 2023-02-27 MED ORDER — FLUOXETINE HCL 40 MG PO CAPS: 40.0000 mg | ORAL_CAPSULE | Freq: Every day | ORAL | 1 refills | Status: DC

## 2023-02-27 MED ORDER — BUSPIRONE HCL 15 MG PO TABS: ORAL_TABLET | ORAL | 1 refills | Status: DC

## 2023-02-27 NOTE — Progress Notes (Signed)
RITCHIE PROIETTI [id number] January 10, 1966 57 y.o.  Subjective:   Patient ID:  DEQWAN EINCK is a 57 y.o. (DOB January 22, 1966) male.  Chief Complaint:  Chief Complaint  Patient presents with   Anxiety    HPI DONTELL GAVINO presents to the office today for follow-up of anxiety and insomnia. He reports that he increased Prozac for 2-3 weeks and noticed lower motivation and was not exercising. He also noticed some slight irritability with increased disagreements with wife. He resumed Prozac 40 mg daily "and I am doing alright." He reports that he has some worry and catastrophic thoughts, to include himself and family. "I have a lot of good things I can focus on." He reports that he is trying to be more intentional about "slowing down." Denies panic attacks or chest tightness. He reports that muscle tension has resolved. Denies change in appetite. He reports that appetite fluctuates. He reports sleep has been difficult and not able to sleep "when I want to." Works nights. Often sleeps 3-4 hours, wakes up, eats, and then returns to sleep. He reports difficulty transitioning from working nights to trying to sleep at night on days off. Concentration has been good. He is able to remember music he is playing on the guitar. He reports that he occasionally will not recall if he used his eye drops already. Denies SI.   He has been getting back into exercise. He has been practicing his guitar and reports that this is a form of relaxation.   He reports that his work is going ok.   Recent wellness visit and reports that he has been thinking more about wellness and prevention- "Instead of worrying, I am thinking about how to get ahead of this."  Klonopin last filled 02/18/23 for 90 days.   Past Psychiatric Medication Trials: Prozac- Increased irritability, low energy, and low libido with 60 mg dose Klonopin Nuvigil- Took when he initially started working nights Wellbutrin XL Depakote Latuda- "felt like  a zombie" and shuffling gait Trazodone- Headaches Ambien    PHQ2-9    [place] [facility] Visit from 02/03/2023 in [place] at [facility] Visit from 03/27/2022 in [place] at [facility] Visit from 03/20/2017 in [place]  PHQ-2 Total Score 0 0 0  PHQ-9 Total Score -- 0 --      [place] [facility] from 12/05/2021 in [facility] at [place] No Risk        Review of Systems:  Review of Systems  Respiratory:  Negative for chest tightness and shortness of breath.   Musculoskeletal:  Positive for arthralgias. Negative for gait problem.  Psychiatric/Behavioral:         Please refer to HPI    Medications: I have reviewed the patient's current medications.  Current Outpatient Medications  Medication Sig Dispense Refill   busPIRone (BUSPAR) 15 MG tablet Take 1/3 tablet p.o. twice daily for 1 week, then take 2/3 tablet p.o. twice daily for 1 week, then take 1 tablet p.o. twice daily 60 tablet 1   clonazePAM (KLONOPIN) 1 MG tablet Take 1 tablet (1 mg total) by mouth 2 (two) times daily as needed for anxiety. 180 tablet 1   cyclobenzaprine (FLEXERIL) 10 MG tablet Take 1 tablet (10 mg total) by mouth at bedtime. (Patient taking differently: Take 10 mg by mouth at bedtime. Takes as needed) 20 tablet 0   latanoprost (XALATAN) 0.005 % ophthalmic  solution Place 1 drop into both eyes at bedtime. 2.5 mL 1   levothyroxine (SYNTHROID) 150 MCG tablet Take 150 mcg by mouth every morning. Takes 1.5 tabs on Sundays     meloxicam (MOBIC) 15 MG tablet Take 1 tablet (15 mg total) by mouth daily. 90 tablet 1   albuterol (VENTOLIN HFA) 108 (90 Base) MCG/ACT inhaler TAKE 2 PUFFS BY MOUTH EVERY 6 HOURS AS NEEDED FOR WHEEZE OR SHORTNESS OF BREATH 18 each 5   B Complex Vitamins (B COMPLEX-B12 PO) Take by mouth.     Cholecalciferol (VITAMIN D3) 2000 units capsule Take 1 capsule  (2,000 Units total) by mouth daily. 100 capsule 3   dorzolamide-timolol (COSOPT) 22.3-6.8 MG/ML ophthalmic solution Place 1 drop into both eyes 2 (two) times daily.     fexofenadine (ALLEGRA) 180 MG tablet TAKE 1 TABLET BY MOUTH EVERY DAY 90 tablet 3   FLUoxetine (PROZAC) 40 MG capsule Take 1 capsule (40 mg total) by mouth daily. 90 capsule 0   loratadine (CLARITIN) 10 MG tablet Take 1 tablet (10 mg total) by mouth daily. 100 tablet 3   pantoprazole (PROTONIX) 40 MG tablet Take 1 tablet (40 mg total) by mouth daily. 90 tablet 3   tadalafil (CIALIS) 20 MG tablet Take 0.5-1 tablets (10-20 mg total) by mouth every three (3) days as needed for erectile dysfunction. 20 tablet 5   Travoprost, BAK Free, (TRAVATAN Z) 0.004 % SOLN ophthalmic solution 1 drop into affected eye every evening Ophthalmic Once a day     No current facility-administered medications for this visit.    Medication Side Effects: None  Allergies: No Known Allergies  Past Medical History:  Diagnosis Date   Allergy    Anxiety    Depression    GERD (gastroesophageal reflux disease)    past hx- some now that comes and goes   Glaucoma (increased eye pressure)    Hemorrhoids    pt states he gets regular flares with these    Hx of adenomatous polyp of colon 10/02/2017   Hyperlipidemia    slightly elevated- working on diet and exercise, no meds    HYPOTHYROIDISM    postsurgical   LOW BACK PAIN    L4-5 degen disc on MRI - s/p ESI 09/2010   Papillary thyroid carcinoma ([place]) 06/16/09 dx   total thyroidectomy for cold nodule & Graves disease   Psoriasis    URINARY RETENTION     Past Medical History, Surgical history, Social history, and Family history were reviewed and updated as appropriate.   Please see review of systems for further details on the patient's review from today.   Objective:   Physical Exam:  There were no vitals taken for this visit.  Physical Exam Constitutional:      General: He is not in acute  distress. Musculoskeletal:        General: No deformity.  Neurological:     Mental Status: He is alert and oriented to person, place, and time.     Coordination: Coordination normal.  Psychiatric:        Attention and Perception: Attention and perception normal. He does not perceive auditory or visual hallucinations.        Mood and Affect: Mood is not depressed. Affect is not labile, blunt, angry or inappropriate.        Speech: Speech normal.        Behavior: Behavior normal.        Thought Content: Thought content normal. Thought content  is not paranoid or delusional. Thought content does not include homicidal or suicidal ideation. Thought content does not include homicidal or suicidal plan.        Cognition and Memory: Cognition and memory normal.        Judgment: Judgment normal.     Comments: Insight intact Mildly anxious     Lab Review:     Component Value Date/Time   NA 137 02/03/2023 1020   K 4.6 02/03/2023 1020   CL 102 02/03/2023 1020   CO2 28 02/03/2023 1020   GLUCOSE 84 02/03/2023 1020   BUN 13 02/03/2023 1020   CREATININE 1.08 02/03/2023 1020   CALCIUM 9.4 02/03/2023 1020   PROT 7.2 02/03/2023 1020   ALBUMIN 4.4 02/03/2023 1020   AST 24 02/03/2023 1020   ALT 25 02/03/2023 1020   ALKPHOS 93 02/03/2023 1020   BILITOT 0.9 02/03/2023 1020   GFRNONAA >60 09/22/2015 2215   GFRAA >60 09/22/2015 2215       Component Value Date/Time   WBC 6.5 02/03/2023 1020   RBC 4.79 02/03/2023 1020   HGB 14.0 02/03/2023 1020   HCT 42.1 02/03/2023 1020   PLT 133.0 (L) 02/03/2023 1020   MCV 88.0 02/03/2023 1020   MCH 30.2 09/22/2015 2215   MCHC 33.1 02/03/2023 1020   RDW 14.4 02/03/2023 1020   LYMPHSABS 2.8 02/03/2023 1020   MONOABS 0.6 02/03/2023 1020   EOSABS 0.2 02/03/2023 1020   BASOSABS 0.0 02/03/2023 1020    No results found for: "POCLITH", "LITHIUM"   No results found for: "PHENYTOIN", "PHENOBARB", "VALPROATE", "CBMZ"   .res Assessment: Plan:    Patient  seen for 30 minutes and time spent discussing response to increase in Prozac and other possible treatment options.  Recommended continuing Prozac 40 mg daily and he had increased irritability and lower motivation with 60 mg dose. Discussed potential benefits, risks, and side effects of BuSpar.  Patient agrees to trial of BuSpar.  Will start BuSpar 15 mg 1/3 tablet twice daily for 1 week, then increase to 2/3 tablet twice daily for 1 week, then increase to 1 tablet twice daily for anxiety. Will continue Klonopin 1 mg twice daily to improve anxiety and sleep disturbance. Recommend continuing psychotherapy with Lanetta Inch, [facility].  Pt to follow-up in 2 months or sooner if clinically indicated.  Patient advised to contact office with any questions, adverse effects, or acute worsening in signs and symptoms.   Kelvis was seen today for anxiety.  Diagnoses and all orders for this visit:  Generalized anxiety disorder -     busPIRone (BUSPAR) 15 MG tablet; Take 1/3 tablet p.o. twice daily for 1 week, then take 2/3 tablet p.o. twice daily for 1 week, then take 1 tablet p.o. twice daily -     Discontinue: FLUoxetine (PROZAC) 40 MG capsule; Take 1 capsule (40 mg total) by mouth daily. TAKE WITH A 20 MG CAPSULE TO EQUAL TOTAL DAILY DOSE OF 60 MG -     FLUoxetine (PROZAC) 40 MG capsule; Take 1 capsule (40 mg total) by mouth daily.  Mild recurrent major depression (HCC) -     Discontinue: FLUoxetine (PROZAC) 40 MG capsule; Take 1 capsule (40 mg total) by mouth daily. TAKE WITH A 20 MG CAPSULE TO EQUAL TOTAL DAILY DOSE OF 60 MG -     FLUoxetine (PROZAC) 40 MG capsule; Take 1 capsule (40 mg total) by mouth daily.  Shift work sleep disorder     Please see After Visit Summary for  patient specific instructions.  Future Appointments  Date Time Provider [place]  03/13/2023  8:30 AM Glennon Mac, DO [facility] None  03/13/2023  2:00 PM Zehr, Laban Emperor, PA-C [facility]  03/20/2023  9:00 AM  [place], [facility] None  05/01/2023  9:30 AM Thayer Headings, PMHNP CP[facility] None    No orders of the defined types were placed in this encounter.   -------------------------------

## 2023-03-13 ENCOUNTER — Ambulatory Visit: Payer: BC Managed Care – PPO | Admitting: Sports Medicine

## 2023-03-13 ENCOUNTER — Encounter: Payer: Self-pay | Admitting: Internal Medicine

## 2023-03-13 ENCOUNTER — Encounter: Payer: Self-pay | Admitting: Gastroenterology

## 2023-03-13 ENCOUNTER — Ambulatory Visit (INDEPENDENT_AMBULATORY_CARE_PROVIDER_SITE_OTHER): Payer: BC Managed Care – PPO | Admitting: Gastroenterology

## 2023-03-13 VITALS — BP 110/78 | HR 90 | Ht 77.0 in | Wt 258.2 lb

## 2023-03-13 DIAGNOSIS — Z86010 Personal history of colonic polyps: Secondary | ICD-10-CM | POA: Diagnosis not present

## 2023-03-13 DIAGNOSIS — Z800 Family history of malignant neoplasm of digestive organs: Secondary | ICD-10-CM

## 2023-03-13 DIAGNOSIS — R1013 Epigastric pain: Secondary | ICD-10-CM

## 2023-03-13 DIAGNOSIS — K219 Gastro-esophageal reflux disease without esophagitis: Secondary | ICD-10-CM

## 2023-03-13 MED ORDER — NA SULFATE-K SULFATE-MG SULF 17.5-3.13-1.6 GM/177ML PO SOLN: 1.0000 | Freq: Once | ORAL | 0 refills | Status: AC

## 2023-03-13 NOTE — Progress Notes (Signed)
03/13/2023 Elijah Ashley [id number] 01/05/1966   HISTORY OF PRESENT ILLNESS: This is a 57 year old male who is a patient of Dr. Celesta Aver.  He is here today to discuss colonoscopy.  Patient had a colonoscopy in October 2018 at which time he had a diminutive colon polyp removed; was a tubular adenoma.  Also had internal hemorrhoids.  Family history of colon cancer in a brother in his 75s and mother in her 2s.  He tells me that he feels like he sometimes does not completely empty his bowels.  He has somewhat of a bowel movement every day, but only every third day or so he feels that he has a complete bowel movement.  No rectal bleeding.  Has also been noticing some epigastric abdominal pain and reflux recently.  He had been on pantoprazole daily, but had run out of that so was off of it for a while.  He recently just resumed that again and it does seem to be helping with the reflux.  Has been taking meloxicam for arthritic type pains, etc.  No dysphagia.  Past Medical History:  Diagnosis Date   Allergy    Anxiety    Depression    GERD (gastroesophageal reflux disease)    past hx- some now that comes and goes   Glaucoma (increased eye pressure)    Hemorrhoids    pt states he gets regular flares with these    Hx of adenomatous polyp of colon 10/02/2017   Hyperlipidemia    slightly elevated- working on diet and exercise, no meds    HYPOTHYROIDISM    postsurgical   LOW BACK PAIN    L4-5 degen disc on MRI - s/p ESI 09/2010   Papillary thyroid carcinoma ([place]) 06/16/09 dx   total thyroidectomy for cold nodule & Graves disease   Psoriasis    URINARY RETENTION    Past Surgical History:  Procedure Laterality Date   CERVICAL LAMINECTOMY  07/2007   C5-6 ACDF due to myelopathy , C3-6 per report    ROTATOR CUFF REPAIR  2023   TOTAL THYROIDECTOMY  03/2009   graves and cold nodule (papillary ca)    reports that he has never smoked. He has never used smokeless tobacco. He reports  current alcohol use. He reports that he does not use drugs. family history includes Breast cancer in an other family member; Cervical cancer in an other family member; Colon cancer in an other family member; Colon cancer (age of onset: 89) in his brother; Colon cancer (age of onset: 69) in his mother; Diabetes in his father; Heart attack in his brother; Hypertension in an other family member; Pancreatic cancer (age of onset: 35) in his sister; Ulcerative colitis in his sister. No Known Allergies    Outpatient Encounter Medications as of 03/13/2023  Medication Sig   albuterol (VENTOLIN HFA) 108 (90 Base) MCG/ACT inhaler TAKE 2 PUFFS BY MOUTH EVERY 6 HOURS AS NEEDED FOR WHEEZE OR SHORTNESS OF BREATH   B Complex Vitamins (B COMPLEX-B12 PO) Take by mouth.   busPIRone (BUSPAR) 15 MG tablet Take 1/3 tablet p.o. twice daily for 1 week, then take 2/3 tablet p.o. twice daily for 1 week, then take 1 tablet p.o. twice daily   Cholecalciferol (VITAMIN D3) 2000 units capsule Take 1 capsule (2,000 Units total) by mouth daily.   clonazePAM (KLONOPIN) 1 MG tablet Take 1 tablet (1 mg total) by mouth 2 (two) times daily as needed for anxiety.   cyclobenzaprine (FLEXERIL) 10  MG tablet Take 1 tablet (10 mg total) by mouth at bedtime. (Patient taking differently: Take 10 mg by mouth at bedtime. Takes as needed)   fexofenadine (ALLEGRA) 180 MG tablet TAKE 1 TABLET BY MOUTH EVERY DAY   FLUoxetine (PROZAC) 40 MG capsule Take 1 capsule (40 mg total) by mouth daily.   latanoprost (XALATAN) 0.005 % ophthalmic solution Place 1 drop into both eyes at bedtime.   levothyroxine (SYNTHROID) 150 MCG tablet Take 150 mcg by mouth every morning. Takes 1.5 tabs on Sundays   loratadine (CLARITIN) 10 MG tablet Take 1 tablet (10 mg total) by mouth daily.   meloxicam (MOBIC) 15 MG tablet Take 1 tablet (15 mg total) by mouth daily.   pantoprazole (PROTONIX) 40 MG tablet Take 1 tablet (40 mg total) by mouth daily.   Travoprost, BAK Free,  (TRAVATAN Z) 0.004 % SOLN ophthalmic solution 1 drop into affected eye every evening Ophthalmic Once a day   tadalafil (CIALIS) 20 MG tablet Take 0.5-1 tablets (10-20 mg total) by mouth every three (3) days as needed for erectile dysfunction.   [DISCONTINUED] dorzolamide-timolol (COSOPT) 22.3-6.8 MG/ML ophthalmic solution Place 1 drop into both eyes 2 (two) times daily. (Patient not taking: Reported on 03/13/2023)   No facility-administered encounter medications on file as of 03/13/2023.     REVIEW OF SYSTEMS  : All other systems reviewed and negative except where noted in the History of Present Illness.   PHYSICAL EXAM: BP 110/78   Pulse 90   Ht 6\' 5"  (1.956 m)   Wt 258 lb 4 oz (117.1 kg)   SpO2 95%   BMI 30.62 kg/m  General: Well developed AA male in no acute distress Head: Normocephalic and atraumatic Eyes:  Sclerae anicteric, conjunctiva pink. Ears: Normal auditory acuity Lungs: Clear throughout to auscultation; no W/R/R. Heart: Regular rate and rhythm; no M/R/G. Abdomen: Soft, non-distended.  BS present.  Non-tender. Rectal:  Will be done at the time of colonoscopy. Musculoskeletal: Symmetrical with no gross deformities  Skin: No lesions on visible extremities Extremities: No edema  Neurological: Alert oriented x 4, grossly non-focal Psychological:  Alert and cooperative. Normal mood and affect  ASSESSMENT AND PLAN: *Family history of colon cancer in his brother in his 70s and mother in his 23s.  Personal history of 1 diminutive adenomatous colon polyp in 09/2017.  Will plan for colonoscopy Dr. Carlean Purl. *Sensation of incomplete bowel movements: Can try Benefiber 2 teaspoons mixed in 8 ounces of liquid daily. *GERD and epigastric abdominal pain: Had been off of his pantoprazole for a while.  Taking meloxicam somewhat regularly for arthritic type pains.  Just recently restarted his pantoprazole.  Will plan for EGD with Dr. Carlean Purl as well.  **The risks, benefits, and  alternatives to EGD and colonoscopy were discussed with the patient and he consents to proceed.       CC:  Plotnikov, Evie Lacks, MD

## 2023-03-13 NOTE — Patient Instructions (Signed)
Start Benefiber 2 teaspoons in 8 ounces of liquid daily.   You have been scheduled for an endoscopy and colonoscopy. Please follow the written instructions given to you at your visit today. Please pick up your prep supplies at the pharmacy within the next 1-3 days. If you use inhalers (even only as needed), please bring them with you on the day of your procedure.  _______________________________________________________  If your blood pressure at your visit was 140/90 or greater, please contact your primary care physician to follow up on this.  _______________________________________________________  If you are age 31 or older, your body mass index should be between 23-30. Your Body mass index is 30.62 kg/m. If this is out of the aforementioned range listed, please consider follow up with your Primary Care Provider.  If you are age 65 or younger, your body mass index should be between 19-25. Your Body mass index is 30.62 kg/m. If this is out of the aformentioned range listed, please consider follow up with your Primary Care Provider.   ________________________________________________________  The [facility] providers would like to encourage you to use [facility] to communicate with providers for non-urgent requests or questions.  Due to long hold times on the telephone, sending your provider a message by [facility] may be a faster and more efficient way to get a response.  Please allow 48 business hours for a response.  Please remember that this is for non-urgent requests.  _______________________________________________________

## 2023-03-19 ENCOUNTER — Ambulatory Visit (AMBULATORY_SURGERY_CENTER): Payer: BC Managed Care – PPO | Admitting: Internal Medicine

## 2023-03-19 ENCOUNTER — Encounter: Payer: Self-pay | Admitting: Internal Medicine

## 2023-03-19 VITALS — BP 115/71 | HR 63 | Temp 97.3°F | Resp 12 | Ht 77.0 in | Wt 258.0 lb

## 2023-03-19 DIAGNOSIS — D128 Benign neoplasm of rectum: Secondary | ICD-10-CM | POA: Diagnosis not present

## 2023-03-19 DIAGNOSIS — K219 Gastro-esophageal reflux disease without esophagitis: Secondary | ICD-10-CM | POA: Diagnosis present

## 2023-03-19 DIAGNOSIS — K2950 Unspecified chronic gastritis without bleeding: Secondary | ICD-10-CM

## 2023-03-19 DIAGNOSIS — K635 Polyp of colon: Secondary | ICD-10-CM

## 2023-03-19 DIAGNOSIS — B9681 Helicobacter pylori [H. pylori] as the cause of diseases classified elsewhere: Secondary | ICD-10-CM

## 2023-03-19 DIAGNOSIS — Z86010 Personal history of colonic polyps: Secondary | ICD-10-CM

## 2023-03-19 DIAGNOSIS — Z09 Encounter for follow-up examination after completed treatment for conditions other than malignant neoplasm: Secondary | ICD-10-CM

## 2023-03-19 DIAGNOSIS — D123 Benign neoplasm of transverse colon: Secondary | ICD-10-CM

## 2023-03-19 DIAGNOSIS — R1013 Epigastric pain: Secondary | ICD-10-CM

## 2023-03-19 MED ORDER — SODIUM CHLORIDE 0.9 % IV SOLN: 500.0000 mL | Freq: Once | INTRAVENOUS | Status: DC

## 2023-03-19 NOTE — Progress Notes (Signed)
Called to room to assist during endoscopic procedure.  Patient ID and intended procedure confirmed with present staff. Received instructions for my participation in the procedure from the performing physician.  

## 2023-03-19 NOTE — Patient Instructions (Addendum)
The stomach lining looked slightly inflamed - biopsies taken. All else ok on upper exam.  Colonoscopy revealed 2 tiny polyps - removed.  Once we see results from pathology will contact you.  I appreciate the opportunity to care for you. Gatha Mayer, MD, [facility]   Discharge instructions given. Handouts on polyps. Resume previous medications. YOU HAD AN ENDOSCOPIC PROCEDURE TODAY AT [facility]:   Refer to the procedure report that was given to you for any specific questions about what was found during the examination.  If the procedure report does not answer your questions, please call your gastroenterologist to clarify.  If you requested that your care partner not be given the details of your procedure findings, then the procedure report has been included in a sealed envelope for you to review at your convenience later.  YOU SHOULD EXPECT: Some feelings of bloating in the abdomen. Passage of more gas than usual.  Walking can help get rid of the air that was put into your GI tract during the procedure and reduce the bloating. If you had a lower endoscopy (such as a colonoscopy or flexible sigmoidoscopy) you may notice spotting of blood in your stool or on the toilet paper. If you underwent a bowel prep for your procedure, you may not have a normal bowel movement for a few days.  Please Note:  You might notice some irritation and congestion in your nose or some drainage.  This is from the oxygen used during your procedure.  There is no need for concern and it should clear up in a day or so.  SYMPTOMS TO REPORT IMMEDIATELY:  Following lower endoscopy (colonoscopy or flexible sigmoidoscopy):  Excessive amounts of blood in the stool  Significant tenderness or worsening of abdominal pains  Swelling of the abdomen that is new, acute  Fever of 100F or higher  Following upper endoscopy (EGD)  Vomiting of blood or coffee ground material  New chest pain or pain under the shoulder  blades  Painful or persistently difficult swallowing  New shortness of breath  Fever of 100F or higher  Black, tarry-looking stools  For urgent or emergent issues, a gastroenterologist can be reached at any hour by calling [phone]. Do not use MyChart messaging for urgent concerns.    DIET:  We do recommend a small meal at first, but then you may proceed to your regular diet.  Drink plenty of fluids but you should avoid alcoholic beverages for 24 hours.  ACTIVITY:  You should plan to take it easy for the rest of today and you should NOT DRIVE or use heavy machinery until tomorrow (because of the sedation medicines used during the test).    FOLLOW UP: Our staff will call the number listed on your records the next business day following your procedure.  We will call around 7:15- 8:00 am to check on you and address any questions or concerns that you may have regarding the information given to you following your procedure. If we do not reach you, we will leave a message.     If any biopsies were taken you will be contacted by phone or by letter within the next 1-3 weeks.  Please call us at [phone] if you have not heard about the biopsies in 3 weeks.    SIGNATURES/CONFIDENTIALITY: You and/or your care partner have signed paperwork which will be entered into your electronic medical record.  These signatures attest to the fact that that the information above  on your After Visit Summary has been reviewed and is understood.  Full responsibility of the confidentiality of this discharge information lies with you and/or your care-partner.

## 2023-03-19 NOTE — Progress Notes (Signed)
A and O x3. Report to RN. Tolerated MAC anesthesia well.Teeth unchanged after procedure. 

## 2023-03-19 NOTE — Progress Notes (Signed)
Pt's states no medical or surgical changes since previsit or office visit. 

## 2023-03-19 NOTE — Op Note (Addendum)
[place] Patient Name: Elijah Ashley Procedure Date: 03/19/2023 1:22 PM MRN: [id number] Endoscopist: Gatha Mayer , MD, [id number] Age: 57 Referring MD:  Date of Birth: 08/04/1966 Gender: Male Account #: [id number] Procedure:                Colonoscopy Indications:              Surveillance: Personal history of adenomatous                            polyps on last colonoscopy > 5 years ago, Last                            colonoscopy: 2018 Medicines:                Monitored Anesthesia Care Procedure:                Pre-Anesthesia Assessment:                           - Prior to the procedure, a History and Physical                            was performed, and patient medications and                            allergies were reviewed. The patient's tolerance of                            previous anesthesia was also reviewed. The risks                            and benefits of the procedure and the sedation                            options and risks were discussed with the patient.                            All questions were answered, and informed consent                            was obtained. Prior Anticoagulants: The patient has                            taken no anticoagulant or antiplatelet agents. ASA                            Grade Assessment: II - A patient with mild systemic                            disease. After reviewing the risks and benefits,                            the patient was deemed in satisfactory condition to  undergo the procedure.                           After obtaining informed consent, the colonoscope                            was passed under direct vision. Throughout the                            procedure, the patient's blood pressure, pulse, and                            oxygen saturations were monitored continuously. The                            Olympus CF-[id number] SN [id number] was introduced  through                            the anus and advanced to the the cecum, identified                            by appendiceal orifice and ileocecal valve. The                            colonoscopy was somewhat difficult due to                            significant looping. Successful completion of the                            procedure was aided by using manual pressure. The                            patient tolerated the procedure well. The quality                            of the bowel preparation was good. The ileocecal                            valve, appendiceal orifice, and rectum were                            photographed. The bowel preparation used was SUPREP                            via split dose instruction. Scope In: 1:36:42 PM Scope Out: 2:00:25 PM Scope Withdrawal Time: 0 hours 20 minutes 10 seconds  Total Procedure Duration: 0 hours 23 minutes 43 seconds  Findings:                 The perianal and digital rectal examinations were                            normal. Pertinent negatives include normal prostate                            (  size, shape, and consistency).                           Two sessile polyps were found in the rectum and                            transverse colon. The polyps were diminutive in                            size. These polyps were removed with a cold snare.                            Resection and retrieval were complete. Verification                            of patient identification for the specimen was                            done. Estimated blood loss was minimal.                           The exam was otherwise without abnormality on                            direct and retroflexion views. Complications:            No immediate complications. Estimated Blood Loss:     Estimated blood loss was minimal. Impression:               - Two diminutive polyps in the rectum and in the                            transverse colon,  removed with a cold snare.                            Resected and retrieved.                           - The examination was otherwise normal on direct                            and retroflexion views. Had to pull cecum into view                            w/ forceps.                           - Personal history of colonic polyp 2018 diminutive                            adenoma                           - family hx CRCa mother 60's and brother 77's Recommendation:           - Patient has  a contact number available for                            emergencies. The signs and symptoms of potential                            delayed complications were discussed with the                            patient. Return to normal activities tomorrow.                            Written discharge instructions were provided to the                            patient.                           - Resume previous diet.                           - Continue present medications.                           - Await pathology results.                           - Repeat colonoscopy is recommended. The                            colonoscopy date will be determined after pathology                            results from today's exam become available for                            review. would apply abdominal binder to overcome                            looping better. Gatha Mayer, MD 03/19/2023 2:10:04 PM This report has been signed electronically.

## 2023-03-19 NOTE — Op Note (Signed)
[place] Patient Name: Elijah Ashley Procedure Date: 03/19/2023 1:23 PM MRN: [id number] Endoscopist: Gatha Mayer , MD, [phone] Age: 57 Referring MD:  Date of Birth: 25-May-1966 Gender: Male Account #: [id number] Procedure:                Upper GI endoscopy Indications:              Epigastric abdominal pain, Suspected esophageal                            reflux Medicines:                Monitored Anesthesia Care Procedure:                Pre-Anesthesia Assessment:                           - Prior to the procedure, a History and Physical                            was performed, and patient medications and                            allergies were reviewed. The patient's tolerance of                            previous anesthesia was also reviewed. The risks                            and benefits of the procedure and the sedation                            options and risks were discussed with the patient.                            All questions were answered, and informed consent                            was obtained. Prior Anticoagulants: The patient has                            taken no anticoagulant or antiplatelet agents. ASA                            Grade Assessment: II - A patient with mild systemic                            disease. After reviewing the risks and benefits,                            the patient was deemed in satisfactory condition to                            undergo the procedure.  After obtaining informed consent, the endoscope was                            passed under direct vision. Throughout the                            procedure, the patient's blood pressure, pulse, and                            oxygen saturations were monitored continuously. The                            GIF [id number] was introduced through the                            mouth, and advanced to the second part of duodenum.                             The upper GI endoscopy was accomplished without                            difficulty. The patient tolerated the procedure                            well. Scope In: Scope Out: Findings:                 Patchy mildly erythematous mucosa without bleeding                            was found in the gastric antrum. Biopsies were                            taken with a cold forceps for histology.                            Verification of patient identification for the                            specimen was done. Estimated blood loss was minimal.                           The exam was otherwise without abnormality.                           The cardia and gastric fundus were normal on                            retroflexion. Complications:            No immediate complications. Estimated Blood Loss:     Estimated blood loss was minimal. Impression:               - Erythematous mucosa in the antrum. Biopsied.                           -  The examination was otherwise normal. Recommendation:           - Patient has a contact number available for                            emergencies. The signs and symptoms of potential                            delayed complications were discussed with the                            patient. Return to normal activities tomorrow.                            Written discharge instructions were provided to the                            patient.                           - Resume previous diet.                           - Continue present medications.                           - Await pathology results.                           - See the other procedure note for documentation of                            additional recommendations. Gatha Mayer, MD 03/19/2023 2:06:48 PM This report has been signed electronically.

## 2023-03-19 NOTE — Progress Notes (Signed)
History and Physical Interval Note:  03/19/2023 1:12 PM  Elijah Ashley  has presented today for endoscopic procedure(s), with the diagnosis of  Encounter Diagnoses  Name Primary?   Hx of adenomatous polyp of colon Yes   Gastroesophageal reflux disease, unspecified whether esophagitis present   .  The various methods of evaluation and treatment have been discussed with the patient and/or family. After consideration of risks, benefits and other options for treatment, the patient has consented to  the endoscopic procedure(s).   The patient's history has been reviewed, patient examined, no change in status, stable for endoscopic procedure(s).  I have reviewed the patient's chart and labs.  Questions were answered to the patient's satisfaction.     Gatha Mayer, MD, [facility]

## 2023-03-20 ENCOUNTER — Telehealth: Payer: Self-pay

## 2023-03-20 ENCOUNTER — Ambulatory Visit: Payer: BC Managed Care – PPO | Admitting: Mental Health

## 2023-03-20 ENCOUNTER — Telehealth: Payer: Self-pay | Admitting: Psychiatry

## 2023-03-20 DIAGNOSIS — F411 Generalized anxiety disorder: Secondary | ICD-10-CM

## 2023-03-20 MED ORDER — BUSPIRONE HCL 15 MG PO TABS
15.0000 mg | ORAL_TABLET | Freq: Two times a day (BID) | ORAL | 1 refills | Status: DC
Start: 2023-03-20 — End: 2023-06-12

## 2023-03-20 NOTE — Progress Notes (Signed)
[facility] psychotherapy note  Name: Elijah Ashley Date: 03/20/2023 MRN: [id number] DOB: 1966-01-22 PCP: Tresa Garter, MD  Time spent: 50 minutes  Treatment:  ind. therapy  Mental Status Exam:    Appearance:    Casual     Behavior:   Appropriate  Motor:   WNL  Speech/Language:    Clear and Coherent  Affect:   Full range   Mood:   Euthymic  Thought process:   Logical, linear, goal directed  Thought content:     WNL  Sensory/Perceptual disturbances:     none  Orientation:   x4  Attention:   Good  Concentration:   Good  Memory:   Intact  Fund of knowledge:    Consistent with age and development  Insight:     Good  Judgment:    Good  Impulse Control:   Good     Reported Symptoms:  some sadness, anxiety  Risk Assessment: Danger to Self:  No Self-injurious Behavior: No Danger to Others: No Duty to Warn:no Physical Aggression / Violence:No  Access to Firearms a concern: No  Gang Involvement:No  Patient / guardian was educated about steps to take if suicide or homicide risk level increases between visits: yes While future psychiatric events cannot be accurately predicted, the patient does not currently require acute inpatient psychiatric care and does not currently meet [facility] involuntary commitment criteria.   Subjective: Patient arrived on time for today's session.  Patient shared recent events and progress.  Patient stated that he is tired.  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  He stated that between work and his home responsibilities he can often feel overwhelmed and exhausted often.  Provide support and understanding as he vented feelings related to his ongoing concerns about his wife's medical conditions, how it impacts her but also the family.  Facilitated his identifying needs both to cope with the stress, sad feelings and ways he can ensure that he is focusing on his own self-care.  He identified the need to prioritize  his sleep schedule more consistently when possible, continue to try and manage financial stress by communicating collaboratively with his wife when needed.     Interventions: Supportive therapy, CBT    Diagnoses:    ICD-10-CM   1. Generalized anxiety disorder  F41.1            Plan: Patient is to use CBT, mindfulness and coping skills.  Utilize his support system and engaging pleasurable activities, such as playing his bass and other instruments.   Long-term goal:   Reduce overall level, frequency, and intensity of the feelings of anxiety at least 3 consecutive months per patient report.    Short-term goal:  Decrease worry about life stressors and improve coping to manage anxiety levels Make efforts to be consistent with self-care.  Engage in interests such as playing his base and other musical instruments, adhere to engaging in meditation    Assessment of progress:  progressing   This record has been created using [employer].  Chart creation errors have been sought, but may not always have been located and corrected. Such creation errors do not reflect on the standard of medical care.   Waldron Session, [facility]

## 2023-03-20 NOTE — Telephone Encounter (Signed)
Pt requesting 90-day supply of medication to be sent to pharmacy. Script sent for 90-day supply of Buspirone.

## 2023-03-20 NOTE — Telephone Encounter (Signed)
  Follow up Call-     03/19/2023   12:50 PM  Call back number  Post procedure Call Back phone  # [phone]  Permission to leave phone message Yes     Patient questions:  Do you have a fever, pain , or abdominal swelling? No. Pain Score  0 *  Have you tolerated food without any problems? Yes.    Have you been able to return to your normal activities? Yes.    Do you have any questions about your discharge instructions: Diet   No. Medications  No. Follow up visit  No.  Do you have questions or concerns about your Care? No.  Actions: * If pain score is 4 or above: No action needed, pain <4.

## 2023-03-26 ENCOUNTER — Encounter: Payer: Self-pay | Admitting: Internal Medicine

## 2023-03-27 ENCOUNTER — Other Ambulatory Visit: Payer: Self-pay | Admitting: Internal Medicine

## 2023-03-27 DIAGNOSIS — B9681 Helicobacter pylori [H. pylori] as the cause of diseases classified elsewhere: Secondary | ICD-10-CM

## 2023-03-27 MED ORDER — BISMUTH SUBSALICYLATE 262 MG PO TABS
524.0000 mg | ORAL_TABLET | Freq: Four times a day (QID) | ORAL | 0 refills | Status: AC
Start: 2023-03-27 — End: 2023-04-10

## 2023-03-27 MED ORDER — METRONIDAZOLE 250 MG PO TABS
250.0000 mg | ORAL_TABLET | Freq: Four times a day (QID) | ORAL | 0 refills | Status: AC
Start: 2023-03-27 — End: 2023-04-10

## 2023-03-27 MED ORDER — DOXYCYCLINE HYCLATE 100 MG PO CAPS
100.0000 mg | ORAL_CAPSULE | Freq: Two times a day (BID) | ORAL | 0 refills | Status: AC
Start: 2023-03-27 — End: 2023-04-10

## 2023-04-07 ENCOUNTER — Other Ambulatory Visit: Payer: Self-pay | Admitting: Internal Medicine

## 2023-05-01 ENCOUNTER — Ambulatory Visit: Payer: BC Managed Care – PPO | Admitting: Psychiatry

## 2023-05-02 ENCOUNTER — Other Ambulatory Visit: Payer: BC Managed Care – PPO

## 2023-05-07 ENCOUNTER — Encounter: Payer: Self-pay | Admitting: Internal Medicine

## 2023-05-07 ENCOUNTER — Ambulatory Visit: Payer: BC Managed Care – PPO

## 2023-05-07 DIAGNOSIS — B9681 Helicobacter pylori [H. pylori] as the cause of diseases classified elsewhere: Secondary | ICD-10-CM

## 2023-05-08 ENCOUNTER — Ambulatory Visit: Payer: BC Managed Care – PPO | Admitting: Mental Health

## 2023-05-08 DIAGNOSIS — F411 Generalized anxiety disorder: Secondary | ICD-10-CM | POA: Diagnosis not present

## 2023-05-08 DIAGNOSIS — F330 Major depressive disorder, recurrent, mild: Secondary | ICD-10-CM | POA: Diagnosis not present

## 2023-05-08 LAB — HELICOBACTER PYLORI  SPECIAL ANTIGEN
MICRO NUMBER:: 14990303
SPECIMEN QUALITY: ADEQUATE

## 2023-05-08 NOTE — Progress Notes (Signed)
[facility] psychotherapy note  Name: Elijah Ashley Date: 05/08/2023 MRN: [id number] DOB: Sep 30, 1966 PCP: Tresa Garter, MD  Time spent: 53 minutes  Treatment:  ind. therapy  Mental Status Exam:    Appearance:    Casual     Behavior:   Appropriate  Motor:   WNL  Speech/Language:    Clear and Coherent  Affect:   Full range   Mood:   Euthymic  Thought process:   Logical, linear, goal directed  Thought content:     WNL  Sensory/Perceptual disturbances:     none  Orientation:   x4  Attention:   Good  Concentration:   Good  Memory:   Intact  Fund of knowledge:    Consistent with age and development  Insight:     Good  Judgment:    Good  Impulse Control:   Good     Reported Symptoms:  some sadness, anxiety  Risk Assessment: Danger to Self:  No Self-injurious Behavior: No Danger to Others: No Duty to Warn:no Physical Aggression / Violence:No  Access to Firearms a concern: No  Gang Involvement:No  Patient / guardian was educated about steps to take if suicide or homicide risk level increases between visits: yes While future psychiatric events cannot be accurately predicted, the patient does not currently require acute inpatient psychiatric care and does not currently meet [facility] involuntary commitment criteria.   Subjective: Patient arrived on time for today's session.  Patient shared recent events where his wife continues to cope with her chronic medical condition.  He stated that he gets concerned due to their having some financial stress which requires him to work even more and not take 1 or maybe 2 days off a week to try and destress for himself.  He stated that he is working out but only 1 day/week when he is off.  He reports that he is often fatigued and plans to follow-up with the med management appointment with Corie Chiquito, NP to further discuss his medications which he has been consistently taking.  Facilitated his identifying feelings  related to his stress related to his marriage where he stated that he wants to know that his wife is okay as she often will not follow up when she starts to have symptoms increase, that she needs to follow up sooner.  He stated she also has a tendency to drink wine most evenings excessively.  He stated that he is worried about her drinking and knows that it can only negatively impact her health.  Facilitated his identifying and framing needs, how to follow through with further discussions with his wife related to her own self-care, her spending habits as a way to express his emotions and not suppress.  He continues to make attempts to integrate meditation into his daily schedule.     Interventions: Supportive therapy, CBT    Diagnoses:    ICD-10-CM   1. 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  Utilize his support system and engaging pleasurable activities, such as playing his bass and other instruments, exercising.   Long-term goal:   Reduce overall level, frequency, and intensity of the feelings of anxiety at least 3 consecutive months per patient report.    Short-term goal:  Decrease worry about life stressors and improve coping to manage  anxiety levels Make efforts to be consistent with self-care.  Engage in interests such as playing his base and other musical instruments, adhere to engaging in meditation    Assessment of progress:  progressing   This record has been created using [employer].  Chart creation errors have been sought, but may not always have been located and corrected. Such creation errors do not reflect on the standard of medical care.   Waldron Session, [facility]

## 2023-05-19 ENCOUNTER — Telehealth: Payer: BC Managed Care – PPO | Admitting: Psychiatry

## 2023-05-21 ENCOUNTER — Other Ambulatory Visit: Payer: Self-pay | Admitting: Psychiatry

## 2023-05-21 DIAGNOSIS — F411 Generalized anxiety disorder: Secondary | ICD-10-CM

## 2023-06-12 ENCOUNTER — Ambulatory Visit: Payer: BC Managed Care – PPO | Admitting: Psychiatry

## 2023-06-12 ENCOUNTER — Encounter: Payer: Self-pay | Admitting: Psychiatry

## 2023-06-12 VITALS — Wt 253.0 lb

## 2023-06-12 DIAGNOSIS — G4726 Circadian rhythm sleep disorder, shift work type: Secondary | ICD-10-CM | POA: Diagnosis not present

## 2023-06-12 DIAGNOSIS — F3342 Major depressive disorder, recurrent, in full remission: Secondary | ICD-10-CM

## 2023-06-12 DIAGNOSIS — F330 Major depressive disorder, recurrent, mild: Secondary | ICD-10-CM

## 2023-06-12 DIAGNOSIS — F411 Generalized anxiety disorder: Secondary | ICD-10-CM

## 2023-06-12 MED ORDER — FLUOXETINE HCL 40 MG PO CAPS: 40.0000 mg | ORAL_CAPSULE | Freq: Every day | ORAL | 0 refills | Status: DC

## 2023-06-12 MED ORDER — BUSPIRONE HCL 15 MG PO TABS
15.0000 mg | ORAL_TABLET | Freq: Two times a day (BID) | ORAL | 1 refills | Status: DC
Start: 2023-06-12 — End: 2023-10-13

## 2023-06-12 NOTE — Progress Notes (Signed)
Elijah Ashley [id number] [birth year] 57 y.o.  Subjective:   Patient ID:  Elijah Ashley is a 57 y.o. (DOB [birth year]) male.  Chief Complaint:  Chief Complaint  Patient presents with   Follow-up    Anxiety and insomnia    HPI Elijah Ashley presents to the office today for follow-up of anxiety and insomnia. He reports, "actually, I am doing good." He reports that worry has improved some. He reports  that he is falling asleep faster. He reports improved sleep. He reports Klonopin usage has lessened. He reports, "I'm seeing more laughter" and feels that his mood is lighter. Finding enjoyment and humor in things. He reports looking forward to things. He and his wife have been thinking about having more time together. He notices less irritability. He reports that he has been exercising and going to the gym. "I'm feeling good." He reports adequate concentration and focus. Appetite has been good. He reports that he is trying to eat healthy foods.  Denies SI.   He reports that his wife had a medical hospitalization. Wife has been monitoring her health and getting things evaluated when symptoms start.   Continues to work nights. Daughter will be going off to college and living on campus at [employer]. They will be going on vacation next week. He is thinking about going to family in [place].   Klonopin last filled 05/23/23  Past Psychiatric Medication Trials: Prozac- Increased irritability, low energy, and low libido with 60 mg dose Klonopin Nuvigil- Took when he initially started working nights Wellbutrin XL Depakote Latuda- "felt like a zombie" and shuffling gait Trazodone- Headaches Ambien   PHQ2-9    Flowsheet Row Office Visit from 02/03/2023 in [facility] Visit from 03/27/2022 in [facility] Visit from 03/20/2017 in [facility]  PHQ-2 Total Score 0 0 0  PHQ-9 Total Score --  0 --      Flowsheet Row ED from 12/05/2021 in [facility] at [facility]  C-SSRS RISK CATEGORY No Risk        Review of Systems:  Review of Systems  HENT:  Positive for rhinorrhea.   Gastrointestinal:        Had colonoscopy and endoscopy in April  Musculoskeletal:  Negative for gait problem.  Psychiatric/Behavioral:         Please refer to HPI    Medications: I have reviewed the patient's current medications.  Current Outpatient Medications  Medication Sig Dispense Refill   ASHWAGANDHA PO Take by mouth.     B Complex Vitamins (B COMPLEX-B12 PO) Take by mouth.     Bacillus Coagulans-Inulin (BENEFIBER PREBIOTIC+PROBIOTIC) CHEW Chew by mouth.     Cholecalciferol (VITAMIN D3) 2000 units capsule Take 1 capsule (2,000 Units total) by mouth daily. 100 capsule 3   clonazePAM (KLONOPIN) 1 MG tablet TAKE 1 TABLET BY MOUTH 2 TIMES DAILY AS NEEDED FOR ANXIETY. 180 tablet 1   fexofenadine (ALLEGRA) 180 MG tablet TAKE 1 TABLET BY MOUTH EVERY DAY 90 tablet 3   Ginkgo Biloba Extract 60 MG CAPS Take by mouth.     levothyroxine (SYNTHROID) 150 MCG tablet Take 150 mcg by mouth every morning. Takes 1.5 tabs on Sundays     meloxicam (MOBIC) 15 MG tablet Take 1 tablet (15 mg total) by mouth daily. (Patient taking differently: Take 15 mg by mouth daily as needed.) 90 tablet 1   Omega-3 Fatty Acids (OMEGA-3  CF PO) Take by mouth.     pantoprazole (PROTONIX) 40 MG tablet Take 1 tablet (40 mg total) by mouth daily. 90 tablet 3   albuterol (VENTOLIN HFA) 108 (90 Base) MCG/ACT inhaler TAKE 2 PUFFS BY MOUTH EVERY 6 HOURS AS NEEDED FOR WHEEZE OR SHORTNESS OF BREATH 18 each 5   busPIRone (BUSPAR) 15 MG tablet Take 1 tablet (15 mg total) by mouth 2 (two) times daily. 180 tablet 1   cyclobenzaprine (FLEXERIL) 10 MG tablet Take 1 tablet (10 mg total) by mouth at bedtime. (Patient taking differently: Take 10 mg by mouth at bedtime. Takes as needed) 20 tablet 0   cyclobenzaprine  (FLEXERIL) 5 MG tablet TAKE 1 TABLET BY MOUTH EVERY DAY AS NEEDED 30 tablet 3   FLUoxetine (PROZAC) 40 MG capsule Take 1 capsule (40 mg total) by mouth daily. 90 capsule 0   latanoprost (XALATAN) 0.005 % ophthalmic solution Place 1 drop into both eyes at bedtime. 2.5 mL 1   loratadine (CLARITIN) 10 MG tablet Take 1 tablet (10 mg total) by mouth daily. (Patient not taking: Reported on 06/12/2023) 100 tablet 3   tadalafil (CIALIS) 20 MG tablet Take 0.5-1 tablets (10-20 mg total) by mouth every three (3) days as needed for erectile dysfunction. 20 tablet 5   Travoprost, BAK Free, (TRAVATAN Z) 0.004 % SOLN ophthalmic solution 1 drop into affected eye every evening Ophthalmic Once a day     No current facility-administered medications for this visit.    Medication Side Effects: Other: Notices some heartburn when he takes Prozac.   Allergies: No Known Allergies  Past Medical History:  Diagnosis Date   Allergy    Anxiety    Depression    GERD (gastroesophageal reflux disease)    past hx- some now that comes and goes   Glaucoma (increased eye pressure)    Hemorrhoids    pt states he gets regular flares with these    Hx of adenomatous polyp of colon 10/02/2017   Hyperlipidemia    slightly elevated- working on diet and exercise, no meds    HYPOTHYROIDISM    postsurgical   LOW BACK PAIN    L4-5 degen disc on MRI - s/p ESI 09/2010   Papillary thyroid carcinoma (HCC) 06/16/09 dx   total thyroidectomy for cold nodule & Graves disease   Psoriasis    URINARY RETENTION     Past Medical History, Surgical history, Social history, and Family history were reviewed and updated as appropriate.   Please see review of systems for further details on the patient's review from today.   Objective:   Physical Exam:  Wt 253 lb (114.8 kg)   BMI 30.00 kg/m   Physical Exam Constitutional:      General: He is not in acute distress. Musculoskeletal:        General: No deformity.  Neurological:      Mental Status: He is alert and oriented to person, place, and time.     Coordination: Coordination normal.  Psychiatric:        Attention and Perception: Attention and perception normal. He does not perceive auditory or visual hallucinations.        Mood and Affect: Mood normal. Mood is not anxious or depressed. Affect is not labile, blunt, angry or inappropriate.        Speech: Speech normal.        Behavior: Behavior normal.        Thought Content: Thought content normal. Thought content is  not paranoid or delusional. Thought content does not include homicidal or suicidal ideation. Thought content does not include homicidal or suicidal plan.        Cognition and Memory: Cognition and memory normal.        Judgment: Judgment normal.     Comments: Insight intact     Lab Review:     Component Value Date/Time   NA 137 02/03/2023 1020   K 4.6 02/03/2023 1020   CL 102 02/03/2023 1020   CO2 28 02/03/2023 1020   GLUCOSE 84 02/03/2023 1020   BUN 13 02/03/2023 1020   CREATININE 1.08 02/03/2023 1020   CALCIUM 9.4 02/03/2023 1020   PROT 7.2 02/03/2023 1020   ALBUMIN 4.4 02/03/2023 1020   AST 24 02/03/2023 1020   ALT 25 02/03/2023 1020   ALKPHOS 93 02/03/2023 1020   BILITOT 0.9 02/03/2023 1020   GFRNONAA >60 09/22/2015 2215   GFRAA >60 09/22/2015 2215       Component Value Date/Time   WBC 6.5 02/03/2023 1020   RBC 4.79 02/03/2023 1020   HGB 14.0 02/03/2023 1020   HCT 42.1 02/03/2023 1020   PLT 133.0 (L) 02/03/2023 1020   MCV 88.0 02/03/2023 1020   MCH 30.2 09/22/2015 2215   MCHC 33.1 02/03/2023 1020   RDW 14.4 02/03/2023 1020   LYMPHSABS 2.8 02/03/2023 1020   MONOABS 0.6 02/03/2023 1020   EOSABS 0.2 02/03/2023 1020   BASOSABS 0.0 02/03/2023 1020    No results found for: "POCLITH", "LITHIUM"   No results found for: "PHENYTOIN", "PHENOBARB", "VALPROATE", "CBMZ"   .res Assessment: Plan:    Will continue current plan of care since target signs and symptoms are well  controlled without any tolerability issues. Will continue Prozac 40 mg daily for anxiety and depression.  Continue Buspar 15 mg twice daily for anxiety. Continue Klonopin 1 mg po BID for anxiety and insomnia that is a result of shift work sleep disorder.  Recommend continuing psychotherapy with Elio Forget, [facility].  Pt to follow-up in 4 months or sooner if clinically indicated.  Patient advised to contact office with any questions, adverse effects, or acute worsening in signs and symptoms.   Elijah Ashley was seen today for follow-up.  Diagnoses and all orders for this visit:  Generalized anxiety disorder -     busPIRone (BUSPAR) 15 MG tablet; Take 1 tablet (15 mg total) by mouth 2 (two) times daily. -     FLUoxetine (PROZAC) 40 MG capsule; Take 1 capsule (40 mg total) by mouth daily.  Recurrent major depressive disorder, in full remission (HCC) -     FLUoxetine (PROZAC) 40 MG capsule; Take 1 capsule (40 mg total) by mouth daily.  Shift work sleep disorder     Please see After Visit Summary for patient specific instructions.  Future Appointments  Date Time Provider Department Center  07/02/2023 10:50 AM Iva Boop, MD [facility]  07/10/2023  8:00 AM Waldron Session, [facility] None  10/13/2023 11:30 AM Corie Chiquito, PM[facility]NP CP[facility]CP None    No orders of the defined types were placed in this encounter.   -------------------------------

## 2023-06-25 ENCOUNTER — Other Ambulatory Visit: Payer: Self-pay | Admitting: Internal Medicine

## 2023-07-02 ENCOUNTER — Encounter: Payer: Self-pay | Admitting: Internal Medicine

## 2023-07-02 ENCOUNTER — Ambulatory Visit (INDEPENDENT_AMBULATORY_CARE_PROVIDER_SITE_OTHER): Payer: BC Managed Care – PPO | Admitting: Internal Medicine

## 2023-07-02 VITALS — BP 114/80 | HR 73 | Ht 76.0 in | Wt 259.5 lb

## 2023-07-02 DIAGNOSIS — R140 Abdominal distension (gaseous): Secondary | ICD-10-CM | POA: Diagnosis not present

## 2023-07-02 DIAGNOSIS — R1319 Other dysphagia: Secondary | ICD-10-CM | POA: Diagnosis not present

## 2023-07-02 DIAGNOSIS — K581 Irritable bowel syndrome with constipation: Secondary | ICD-10-CM

## 2023-07-02 NOTE — Progress Notes (Unsigned)
Elijah Ashley 56 y.o. 1966-10-29 [id number]  Assessment & Plan:   Encounter Diagnoses  Name Primary?   Esophageal dysphagia Yes   Bloating    Irritable bowel syndrome with constipation suspected    Orders Placed This Encounter  Procedures   DG ESOPHAGUS W DOUBLE CM (HD)    Beano trial  Gas/Flat diet Benefiber 1 tbsp every day Can stop probiotic - use foods  Hold FODzyme in reserve  WU:[id number], Georgina Quint, MD   Subjective:   Chief Complaint: bloating and dysphagia  HPI 57 yo man w/ recent EGD and colonoscopy here w/ c/o bloating and dysphagia. Has chronic constipation issues  Wt Readings from Last 3 Encounters:  07/02/23 259 lb 8 oz (117.7 kg)  03/19/23 258 lb (117 kg)  03/13/23 258 lb 4 oz (117.1 kg)   COLONOSCOPY 03/19/23    - Two diminutive polyps in the rectum and in the                            transverse colon, removed with a cold snare.                            Resected and retrieved. 1 adenoma 1 HPP                           - The examination was otherwise normal on direct                            and retroflexion views. Had to pull cecum into view                            w/ forceps.                           - Personal history of colonic polyp 2018 diminutive                            adenoma                           - family hx CRCa mother 56's and brother 52's  EGD 03/19/23 - Erythematous mucosa in the antrum. Biopsied.H pylori gastritis Tx and resolved                           - The examination was otherwise normal.   Having some intermittent sold food dysphagia - mostly food moving slow. No clear impact dysphagia.  Also main c/o that of bloating as day progresses - resolves after sleeping. 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 Says all bloated at end of shift. Knows he is heavier than before also. Probiotics not helping. No CO2 beverages, no art sweeteners, no sodas/sugary beverages. Does eat cruciferous vegetables  like cabbage and broccoli. Stools regular but incomplete defecation.   No Known Allergies Current Meds  Medication Sig   albuterol (VENTOLIN HFA) 108 (90 Base) MCG/ACT inhaler TAKE 2 PUFFS BY MOUTH EVERY 6 HOURS AS NEEDED FOR WHEEZE OR SHORTNESS OF BREATH   ASHWAGANDHA PO Take by mouth.   B Complex Vitamins (B COMPLEX-B12  PO) Take by mouth.   Bacillus Coagulans-Inulin (BENEFIBER PREBIOTIC+PROBIOTIC) CHEW Chew by mouth.   busPIRone (BUSPAR) 15 MG tablet Take 1 tablet (15 mg total) by mouth 2 (two) times daily.   Cholecalciferol (VITAMIN D3) 2000 units capsule Take 1 capsule (2,000 Units total) by mouth daily.   clonazePAM (KLONOPIN) 1 MG tablet TAKE 1 TABLET BY MOUTH 2 TIMES DAILY AS NEEDED FOR ANXIETY.   cyclobenzaprine (FLEXERIL) 10 MG tablet Take 1 tablet (10 mg total) by mouth at bedtime. (Patient taking differently: Take 10 mg by mouth at bedtime. Takes as needed)   cyclobenzaprine (FLEXERIL) 5 MG tablet TAKE 1 TABLET BY MOUTH EVERY DAY AS NEEDED   fexofenadine (ALLEGRA) 180 MG tablet TAKE 1 TABLET BY MOUTH EVERY DAY   FLUoxetine (PROZAC) 40 MG capsule Take 1 capsule (40 mg total) by mouth daily.   Ginkgo Biloba Extract 60 MG CAPS Take by mouth.   latanoprost (XALATAN) 0.005 % ophthalmic solution Place 1 drop into both eyes at bedtime.   levothyroxine (SYNTHROID) 150 MCG tablet Take 150 mcg by mouth every morning. Takes 1.5 tabs on Sundays   loratadine (CLARITIN) 10 MG tablet Take 1 tablet (10 mg total) by mouth daily.   meloxicam (MOBIC) 15 MG tablet Take 1 tablet (15 mg total) by mouth daily. (Patient taking differently: Take 15 mg by mouth daily as needed.)   Omega-3 Fatty Acids (OMEGA-3 CF PO) Take by mouth.   pantoprazole (PROTONIX) 40 MG tablet Take 1 tablet (40 mg total) by mouth daily.   tadalafil (CIALIS) 20 MG tablet TAKE 0.5 - 1 TABLET BY MOUTH EVERY 3 DAYS AS NEEDED FOR ED   Travoprost, BAK Free, (TRAVATAN Z) 0.004 % SOLN ophthalmic solution Place 1 drop into both eyes 2  (two) times daily.   Past Medical History:  Diagnosis Date   Allergy    Anxiety    Depression    GERD (gastroesophageal reflux disease)    past hx- some now that comes and goes   Glaucoma (increased eye pressure)    Hemorrhoids    pt states he gets regular flares with these    Hx of adenomatous polyp of colon 10/02/2017   Hyperlipidemia    slightly elevated- working on diet and exercise, no meds    HYPOTHYROIDISM    postsurgical   LOW BACK PAIN    L4-5 degen disc on MRI - s/p ESI 09/2010   Papillary thyroid carcinoma (HCC) 06/16/09 dx   total thyroidectomy for cold nodule & Graves disease   Psoriasis    URINARY RETENTION    Past Surgical History:  Procedure Laterality Date   CERVICAL LAMINECTOMY  07/2007   C5-6 ACDF due to myelopathy , C3-6 per report    ROTATOR CUFF REPAIR  2023   TOTAL THYROIDECTOMY  03/2009   graves and cold nodule (papillary ca)   Social History   Social History Narrative   Married, lives with spouse and dtr   Works at [employer] -    Former smoker   family history includes Breast cancer in an other family member; Cervical cancer in an other family member; Colon cancer in an other family member; Colon cancer (age of onset: 22) in his brother; Colon cancer (age of onset: 32) in his mother; Diabetes in his father; Heart attack in his brother; Hypertension in an other family member; Pancreatic cancer (age of onset: 84) in his sister; Ulcerative colitis in his sister.   Review of Systems As per HPI  Objective:  Physical Exam @BP  114/80   Pulse 73   Ht 6\' 4"  (1.93 m)   Wt 259 lb 8 oz (117.7 kg)   BMI 31.59 kg/m @  General:  NAD Eyes:   anicteric Lungs:  clear Heart::  S1S2 no rubs, murmurs or gallops Abdomen:  soft and nontender, BS+   Data Reviewed:  See HPI

## 2023-07-02 NOTE — Patient Instructions (Addendum)
You have been scheduled for a Barium Esophogram at [facility] (1st floor of the hospital) on 07/24/2023 at 9:00am. Please arrive 15 minutes prior to your appointment for registration. Make certain not to have anything to eat or drink 3 hours prior to your test. If you need to reschedule for any reason, please contact radiology at [phone] to do so. __________________________________________________________________ A barium swallow is an examination that concentrates on views of the esophagus. This tends to be a double contrast exam (barium and two liquids which, when combined, create a gas to distend the wall of the oesophagus) or single contrast (non-ionic iodine based). The study is usually tailored to your symptoms so a good history is essential. Attention is paid during the study to the form, structure and configuration of the esophagus, looking for functional disorders (such as aspiration, dysphagia, achalasia, motility and reflux) EXAMINATION You may be asked to change into a gown, depending on the type of swallow being performed. A radiologist and radiographer will perform the procedure. The radiologist will advise you of the type of contrast selected for your procedure and direct you during the exam. You will be asked to stand, sit or lie in several different positions and to hold a small amount of fluid in your mouth before being asked to swallow while the imaging is performed .In some instances you may be asked to swallow barium coated marshmallows to assess the motility of a solid food bolus. The exam can be recorded as a digital or video fluoroscopy procedure. POST PROCEDURE It will take 1-2 days for the barium to pass through your system. To facilitate this, it is important, unless otherwise directed, to increase your fluids for the next 24-48hrs and to resume your normal diet.  This test typically takes about 30 minutes to  perform. __________________________________________________________________________________    Start Benefiber 1 tablespoon before bedtime. Handout provided.  You can stop the probiotic chew - not helping it sounds like.  You can try Beano supplement to see if that helps.  Probiotics are important for health, they are best gotten through foods.  Pills are not proven to be effective in some research is suggesting they may actually cause problems.  Here are some examples of probiotic foods (also known as fermented foods):  Yogurt-high protein whole fat yogurt is best I believe, [place] yogurt is an example.  Add your own fruit because sugar is almost always added to yogurt with fruit in it found at the store.  Kefir-kind of like liquid yogurt  Miso-an Asian fermented bean paste  Sauerkraut  Kimchi-Asian fermented cabbage  Apple cider vinegar-use the liquid not Gummies or pills.  Some people can tolerate a spoonful of this alone but many fine adding it to water reduces the acidity.  You can work up to higher doses.  If taking it   undiluted rinse your mouth or drink water afterwards to reduce risk of dental damage.  A tablespoon a day is a good dose.  You could take more also.  Some people also find this helpful with reflux.  I appreciate the opportunity to care for you. Stan Head, MD, [facility]

## 2023-07-10 ENCOUNTER — Ambulatory Visit (INDEPENDENT_AMBULATORY_CARE_PROVIDER_SITE_OTHER): Payer: BC Managed Care – PPO | Admitting: Mental Health

## 2023-07-10 DIAGNOSIS — F411 Generalized anxiety disorder: Secondary | ICD-10-CM | POA: Diagnosis not present

## 2023-07-10 NOTE — Progress Notes (Signed)
[facility] psychotherapy note  Name: Elijah Ashley Date: 07/10/2023 MRN: [id number] DOB: 11-18-1966 PCP: Tresa Garter, MD  Time spent: 52 minutes  Treatment:  ind. therapy  Mental Status Exam:    Appearance:    Casual     Behavior:   Appropriate  Motor:   WNL  Speech/Language:    Clear and Coherent  Affect:   Full range   Mood:   Euthymic  Thought process:   Logical, linear, goal directed  Thought content:     WNL  Sensory/Perceptual disturbances:     none  Orientation:   x4  Attention:   Good  Concentration:   Good  Memory:   Intact  Fund of knowledge:    Consistent with age and development  Insight:     Good  Judgment:    Good  Impulse Control:   Good     Reported Symptoms:  some sadness, anxiety  Risk Assessment: Danger to Self:  No Self-injurious Behavior: No Danger to Others: No Duty to Warn:no Physical Aggression / Violence:No  Access to Firearms a concern: No  Gang Involvement:No  Patient / guardian was educated about steps to take if suicide or homicide risk level increases between visits: yes While future psychiatric events cannot be accurately predicted, the patient does not currently require acute inpatient psychiatric care and does not currently meet [facility] involuntary commitment criteria.   Subjective: Patient arrived on time for today's session.  Explored changes, progress with patient.  He focused primarily on a recent issue that occurred that was distressful for both the patient and his wife.  He stated that they received a text message from her son, patient's stepson a few days ago.  This is the same son who has history of aggression, making threats and having substance use issues in the past.  Approximately 1 year ago, was the last time they had communicated with him after his being asked to leave the home due to his behaviors.  Patient shared how this affected him as well as his wife.  He shared more history related to  what he has had to witness and endure growing up, feels he may have some trauma related experiences.  Through further guided discovery, he identified his need to be supportive to his wife but also he admits feeling "on guard" due to what he experienced with his stepson not only last year but some years prior.  How this has also affected his anxiety was explored.      Interventions: Supportive therapy, motivational interviewing    Diagnoses:    ICD-10-CM   1. Generalized anxiety disorder  F41.1        Plan: Patient is to use CBT, mindfulness and coping skills.  Utilize his support system and engaging pleasurable activities, such as playing his bass and other instruments, exercising.   Long-term goal:   Reduce overall level, frequency, and intensity of the feelings of anxiety at least 3 consecutive months per patient report.    Short-term goal:  Decrease worry about life stressors and improve coping to manage anxiety levels Make efforts to be consistent with self-care.  Engage in interests such as playing his base and other musical instruments, adhere to engaging in meditation    Assessment of progress:  progressing   This record has been created using [employer].  Chart creation errors have been sought, but may not always have been located and corrected. Such creation errors do not reflect on the standard  of medical care.   Waldron Session, [facility]

## 2023-07-24 ENCOUNTER — Other Ambulatory Visit (HOSPITAL_COMMUNITY): Payer: BC Managed Care – PPO

## 2023-08-14 ENCOUNTER — Ambulatory Visit (HOSPITAL_COMMUNITY)
Admission: RE | Admit: 2023-08-14 | Discharge: 2023-08-14 | Disposition: A | Discharge status: Home / Self Care | Payer: BC Managed Care – PPO | Source: Ambulatory Visit | Attending: Internal Medicine | Admitting: Internal Medicine

## 2023-08-14 DIAGNOSIS — R1319 Other dysphagia: Secondary | ICD-10-CM | POA: Diagnosis present

## 2023-10-09 ENCOUNTER — Ambulatory Visit (INDEPENDENT_AMBULATORY_CARE_PROVIDER_SITE_OTHER): Payer: BC Managed Care – PPO | Admitting: Mental Health

## 2023-10-09 DIAGNOSIS — F411 Generalized anxiety disorder: Secondary | ICD-10-CM | POA: Diagnosis not present

## 2023-10-09 NOTE — Progress Notes (Signed)
[facility] psychotherapy note  Name: Elijah Ashley Date: 10/09/2023 MRN: [id number] DOB: [birth year] PCP: Tresa Garter, MD  Time spent:  45 minutes  Treatment:  ind. therapy  Mental Status Exam:    Appearance:    Casual     Behavior:   Appropriate  Motor:   WNL  Speech/Language:    Clear and Coherent  Affect:   Full range   Mood:   Euthymic  Thought process:   Logical, linear, goal directed  Thought content:     WNL  Sensory/Perceptual disturbances:     none  Orientation:   x4  Attention:   Good  Concentration:   Good  Memory:   Intact  Fund of knowledge:    Consistent with age and development  Insight:     Good  Judgment:    Good  Impulse Control:   Good     Reported Symptoms:  some sadness, anxiety  Risk Assessment: Danger to Self:  No Self-injurious Behavior: No Danger to Others: No Duty to Warn:no Physical Aggression / Violence:No  Access to Firearms a concern: No  Gang Involvement:No  Patient / guardian was educated about steps to take if suicide or homicide risk level increases between visits: yes While future psychiatric events cannot be accurately predicted, the patient does not currently require acute inpatient psychiatric care and does not currently meet [facility] involuntary commitment criteria.   Subjective: Patient arrived on time for today's session.  Assessed progress since last visit.  Patient shared stressors, coping with the loss of 2 friends of he and his wife.  He went on to share one of them is his god sister, how this loss was unexpected.  Provided space for patient to identify and share feelings.  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  He also continues to cope with shoulder pain and knee pain, following up with his doctor to receive cortisol injections.  He stated that they have continued to set a boundary with their son, who moved out last year.  He shared how he  feels somewhat depressed recently, denies it being severe but struggles with motivation, some anhedonia.  He continues to play his music when his schedule allows which is one of the main ways he is trying to ensure that he is engaging in fulfilling activities for himself.  Processed feelings also related to ongoing concerns related to his wife's medical condition.  Ways he continues to try and manage their finances as this stressor also has been ongoing.       Interventions: Supportive therapy, motivational interviewing    Diagnoses:    ICD-10-CM   1. Generalized anxiety disorder  F41.1         Plan: Patient is to use CBT, mindfulness and coping skills.  Utilize his support system and engaging pleasurable activities, such as playing his bass and other instruments, exercising.   Long-term goal:   Reduce overall level, frequency, and intensity of the feelings of anxiety at least 3 consecutive months per patient report.    Short-term goal:  Decrease worry about life stressors and improve coping to manage anxiety levels Make efforts to be consistent with self-care.  Engage in interests such as playing his base and other musical instruments, adhere to engaging in meditation    Assessment of progress:  progressing   This record has been created using [employer].  Chart creation errors have been sought, but  may not always have been located and corrected. Such creation errors do not reflect on the standard of medical care.   Waldron Session, [facility]

## 2023-10-10 ENCOUNTER — Ambulatory Visit: Payer: BC Managed Care – PPO | Admitting: Internal Medicine

## 2023-10-10 ENCOUNTER — Encounter: Payer: Self-pay | Admitting: Internal Medicine

## 2023-10-10 VITALS — BP 114/78 | HR 75 | Temp 98.1°F | Ht 76.0 in | Wt 263.0 lb

## 2023-10-10 DIAGNOSIS — E890 Postprocedural hypothyroidism: Secondary | ICD-10-CM

## 2023-10-10 DIAGNOSIS — M545 Low back pain, unspecified: Secondary | ICD-10-CM | POA: Diagnosis not present

## 2023-10-10 DIAGNOSIS — E66811 Obesity, class 1: Secondary | ICD-10-CM | POA: Insufficient documentation

## 2023-10-10 DIAGNOSIS — E785 Hyperlipidemia, unspecified: Secondary | ICD-10-CM | POA: Diagnosis not present

## 2023-10-10 DIAGNOSIS — F419 Anxiety disorder, unspecified: Secondary | ICD-10-CM | POA: Diagnosis not present

## 2023-10-10 DIAGNOSIS — Z6832 Body mass index (BMI) 32.0-32.9, adult: Secondary | ICD-10-CM

## 2023-10-10 DIAGNOSIS — G8929 Other chronic pain: Secondary | ICD-10-CM

## 2023-10-10 DIAGNOSIS — R062 Wheezing: Secondary | ICD-10-CM

## 2023-10-10 DIAGNOSIS — Z23 Encounter for immunization: Secondary | ICD-10-CM

## 2023-10-10 LAB — CBC WITH DIFFERENTIAL/PLATELET
Basophils Absolute: 0 10*3/uL (ref 0.0–0.1)
Basophils Relative: 0.7 % (ref 0.0–3.0)
Eosinophils Absolute: 0.1 10*3/uL (ref 0.0–0.7)
Eosinophils Relative: 1.9 % (ref 0.0–5.0)
HCT: 41.5 % (ref 39.0–52.0)
Hemoglobin: 13.3 g/dL (ref 13.0–17.0)
Lymphocytes Relative: 41.7 % (ref 12.0–46.0)
Lymphs Abs: 3.1 10*3/uL (ref 0.7–4.0)
MCHC: 31.9 g/dL (ref 30.0–36.0)
MCV: 88.1 fL (ref 78.0–100.0)
Monocytes Absolute: 0.8 10*3/uL (ref 0.1–1.0)
Monocytes Relative: 11.1 % (ref 3.0–12.0)
Neutro Abs: 3.3 10*3/uL (ref 1.4–7.7)
Neutrophils Relative %: 44.6 % (ref 43.0–77.0)
Platelets: 144 10*3/uL — ABNORMAL LOW (ref 150.0–400.0)
RBC: 4.71 Mil/uL (ref 4.22–5.81)
RDW: 14 % (ref 11.5–15.5)
WBC: 7.5 10*3/uL (ref 4.0–10.5)

## 2023-10-10 LAB — LIPID PANEL
Cholesterol: 218 mg/dL — ABNORMAL HIGH (ref 0–200)
HDL: 37.8 mg/dL — ABNORMAL LOW (ref >39.00)
LDL Cholesterol: 158 mg/dL — ABNORMAL HIGH (ref 0–99)
NonHDL: 180.54
Total CHOL/HDL Ratio: 6
Triglycerides: 111 mg/dL (ref 0.0–149.0)
VLDL: 22.2 mg/dL (ref 0.0–40.0)

## 2023-10-10 LAB — COMPREHENSIVE METABOLIC PANEL
ALT: 24 U/L (ref 0–53)
AST: 24 U/L (ref 0–37)
Albumin: 4.3 g/dL (ref 3.5–5.2)
Alkaline Phosphatase: 90 U/L (ref 39–117)
BUN: 11 mg/dL (ref 6–23)
CO2: 29 meq/L (ref 19–32)
Calcium: 9.1 mg/dL (ref 8.4–10.5)
Chloride: 101 meq/L (ref 96–112)
Creatinine, Ser: 1.04 mg/dL (ref 0.40–1.50)
GFR: 80.5 mL/min (ref >60.00)
Glucose, Bld: 95 mg/dL (ref 70–99)
Potassium: 3.8 meq/L (ref 3.5–5.1)
Sodium: 137 meq/L (ref 135–145)
Total Bilirubin: 0.5 mg/dL (ref 0.2–1.2)
Total Protein: 7.5 g/dL (ref 6.0–8.3)

## 2023-10-10 LAB — URINALYSIS
Bilirubin Urine: NEGATIVE
Hgb urine dipstick: NEGATIVE
Ketones, ur: NEGATIVE
Leukocytes,Ua: NEGATIVE
Nitrite: NEGATIVE
Specific Gravity, Urine: <=1.005 — AB (ref 1.000–1.030)
Total Protein, Urine: NEGATIVE
Urine Glucose: NEGATIVE
Urobilinogen, UA: 0.2 (ref 0.0–1.0)
pH: 7 (ref 5.0–8.0)

## 2023-10-10 LAB — TSH: TSH: 1.47 u[IU]/mL (ref 0.35–5.50)

## 2023-10-10 MED ORDER — WEGOVY 0.25 MG/0.5ML ~~LOC~~ SOAJ: 0.2500 mg | SUBCUTANEOUS | 2 refills | Status: DC

## 2023-10-10 NOTE — Assessment & Plan Note (Addendum)
ENT appt is pending Loose wt - diet Chest CT calcium score 0 in 2022, normal

## 2023-10-10 NOTE — Assessment & Plan Note (Addendum)
Check labs Loose wt - diet Chest CT calcium score 0 in 2022, normal

## 2023-10-10 NOTE — Assessment & Plan Note (Signed)
Cont on Fluoxetine, Klonopin, Buspar

## 2023-10-10 NOTE — Assessment & Plan Note (Signed)
Check labs 

## 2023-10-10 NOTE — Assessment & Plan Note (Signed)
Discussed - Start [phone]  Phentermine option discussed

## 2023-10-10 NOTE — Progress Notes (Signed)
Subjective:  Patient ID: Elijah Ashley, male    DOB: 1966-11-17  Age: 57 y.o. MRN: [id number]  CC: Medical Management of Chronic Issues (Swollen lymph nodes, neck, under his left arm )   HPI Merlene Morse Scalzo presents for hypothyroidism, anxiety, depression C/o swollen lymph nodes, neck, under his left arm   Outpatient Medications Prior to Visit  Medication Sig Dispense Refill   ASHWAGANDHA PO Take by mouth.     B Complex Vitamins (B COMPLEX-B12 PO) Take by mouth.     Bacillus Coagulans-Inulin (BENEFIBER PREBIOTIC+PROBIOTIC) CHEW Chew by mouth.     busPIRone (BUSPAR) 15 MG tablet Take 1 tablet (15 mg total) by mouth 2 (two) times daily. 180 tablet 1   Cholecalciferol (VITAMIN D3) 2000 units capsule Take 1 capsule (2,000 Units total) by mouth daily. 100 capsule 3   clonazePAM (KLONOPIN) 1 MG tablet TAKE 1 TABLET BY MOUTH 2 TIMES DAILY AS NEEDED FOR ANXIETY. 180 tablet 1   cyclobenzaprine (FLEXERIL) 5 MG tablet TAKE 1 TABLET BY MOUTH EVERY DAY AS NEEDED 30 tablet 3   fexofenadine (ALLEGRA) 180 MG tablet TAKE 1 TABLET BY MOUTH EVERY DAY 90 tablet 3   FLUoxetine (PROZAC) 40 MG capsule Take 1 capsule (40 mg total) by mouth daily. 90 capsule 0   Ginkgo Biloba Extract 60 MG CAPS Take by mouth.     latanoprost (XALATAN) 0.005 % ophthalmic solution Place 1 drop into both eyes at bedtime. 2.5 mL 1   levothyroxine (SYNTHROID) 150 MCG tablet Take 150 mcg by mouth every morning. Takes 1.5 tabs on Sundays     meloxicam (MOBIC) 15 MG tablet Take 1 tablet (15 mg total) by mouth daily. (Patient taking differently: Take 15 mg by mouth daily as needed.) 90 tablet 1   Omega-3 Fatty Acids (OMEGA-3 CF PO) Take by mouth.     pantoprazole (PROTONIX) 40 MG tablet Take 1 tablet (40 mg total) by mouth daily. 90 tablet 3   tadalafil (CIALIS) 20 MG tablet TAKE 0.5 - 1 TABLET BY MOUTH EVERY 3 DAYS AS NEEDED FOR ED 5 tablet 2   Travoprost, BAK Free, (TRAVATAN Z) 0.004 % SOLN ophthalmic solution Place 1 drop  into both eyes 2 (two) times daily.     albuterol (VENTOLIN HFA) 108 (90 Base) MCG/ACT inhaler TAKE 2 PUFFS BY MOUTH EVERY 6 HOURS AS NEEDED FOR WHEEZE OR SHORTNESS OF BREATH (Patient not taking: Reported on 10/10/2023) 18 each 5   cyclobenzaprine (FLEXERIL) 10 MG tablet Take 1 tablet (10 mg total) by mouth at bedtime. (Patient not taking: Reported on 10/10/2023) 20 tablet 0   loratadine (CLARITIN) 10 MG tablet Take 1 tablet (10 mg total) by mouth daily. (Patient not taking: Reported on 10/10/2023) 100 tablet 3   No facility-administered medications prior to visit.    ROS: Review of Systems  Constitutional:  Positive for unexpected weight change. Negative for appetite change and fatigue.  HENT:  Negative for congestion, nosebleeds, sneezing, sore throat and trouble swallowing.   Eyes:  Negative for itching and visual disturbance.  Respiratory:  Negative for cough.   Cardiovascular:  Negative for chest pain, palpitations and leg swelling.  Gastrointestinal:  Negative for abdominal distention, blood in stool, diarrhea and nausea.  Genitourinary:  Negative for frequency and hematuria.  Musculoskeletal:  Negative for back pain, gait problem, joint swelling and neck pain.  Skin:  Negative for rash.  Neurological:  Negative for dizziness, tremors, speech difficulty and weakness.  Psychiatric/Behavioral:  Positive for dysphoric mood.  Negative for agitation and sleep disturbance. The patient is nervous/anxious.     Objective:  BP 114/78   Pulse 75   Temp 98.1 F (36.7 C) (Oral)   Ht 6\' 4"  (1.93 m)   Wt 263 lb (119.3 kg)   SpO2 97%   BMI 32.01 kg/m   BP Readings from Last 3 Encounters:  10/10/23 114/78  07/02/23 114/80  03/19/23 115/71    Wt Readings from Last 3 Encounters:  10/10/23 263 lb (119.3 kg)  07/02/23 259 lb 8 oz (117.7 kg)  03/19/23 258 lb (117 kg)    Physical Exam Constitutional:      General: He is not in acute distress.    Appearance: He is well-developed. He is  obese.     Comments: NAD  Eyes:     Conjunctiva/sclera: Conjunctivae normal.     Pupils: Pupils are equal, round, and reactive to light.  Neck:     Thyroid: No thyromegaly.     Vascular: No JVD.  Cardiovascular:     Rate and Rhythm: Normal rate and regular rhythm.     Heart sounds: Normal heart sounds. No murmur heard.    No friction rub. No gallop.  Pulmonary:     Effort: Pulmonary effort is normal. No respiratory distress.     Breath sounds: Normal breath sounds. No wheezing or rales.  Chest:     Chest wall: No tenderness.  Abdominal:     General: Bowel sounds are normal. There is no distension.     Palpations: Abdomen is soft. There is no mass.     Tenderness: There is no abdominal tenderness. There is no guarding or rebound.  Musculoskeletal:        General: No tenderness. Normal range of motion.     Cervical back: Normal range of motion.  Lymphadenopathy:     Cervical: No cervical adenopathy.  Skin:    General: Skin is warm and dry.     Findings: No rash.  Neurological:     Mental Status: He is alert and oriented to person, place, and time.     Cranial Nerves: No cranial nerve deficit.     Motor: No abnormal muscle tone.     Coordination: Coordination normal.     Gait: Gait normal.     Deep Tendon Reflexes: Reflexes are normal and symmetric.  Psychiatric:        Behavior: Behavior normal.        Thought Content: Thought content normal.        Judgment: Judgment normal.    No adenopathy   Lab Results  Component Value Date   WBC 6.5 02/03/2023   HGB 14.0 02/03/2023   HCT 42.1 02/03/2023   PLT 133.0 (L) 02/03/2023   GLUCOSE 84 02/03/2023   CHOL 234 (H) 02/03/2023   TRIG 129.0 02/03/2023   HDL 44.90 02/03/2023   LDLDIRECT 176.7 11/12/2013   LDLCALC 163 (H) 02/03/2023   ALT 25 02/03/2023   AST 24 02/03/2023   NA 137 02/03/2023   K 4.6 02/03/2023   CL 102 02/03/2023   CREATININE 1.08 02/03/2023   BUN 13 02/03/2023   CO2 28 02/03/2023   TSH 2.94  02/03/2023   PSA 0.62 02/03/2023   INR 1.0 06/14/2009   HGBA1C 5.2 05/04/2020    DG ESOPHAGUS W DOUBLE CM (HD)  Result Date: 08/14/2023 CLINICAL DATA:  Dysphagia.  History of thyroid surgery. EXAM: ESOPHOGRAM/BARIUM SWALLOW TECHNIQUE: Combined double contrast and single contrast examination performed using effervescent  crystals, thick barium liquid, and thin barium liquid. FLUOROSCOPY: Radiation Exposure Index (as provided by the fluoroscopic device): 27.2 mGy Kerma COMPARISON:  None Available. FINDINGS: Scout: Visualized bilateral lungs are clear. Bilateral lateral costophrenic angles are clear. No free air under the domes of diaphragm. Double contrast views of the esophageal mucosa are normal-appearing. Esophageal motility is normal. There is no stricture, ring or web. No hiatal hernia. No spontaneous gastro-esophageal reflux and no reflux with the provocative "water siphon test", coughing or Valsalva maneuver. IMPRESSION: *Normal esophagram. Electronically Signed   By: Jules Schick M.D.   On: 08/14/2023 10:59    Assessment & Plan:   Problem List Items Addressed This Visit     Hypothyroidism - Primary    Dr Sharl Ma Post-thyroidectomy On Levothroid        LOW BACK PAIN    Check labs      Relevant Orders   TSH   Urinalysis   CBC with Differential/Platelet   Lipid panel   Comprehensive metabolic panel   Anxiety    Cont on Fluoxetine, Klonopin, Buspar       Relevant Orders   Urinalysis   Dyslipidemia    Check labs Loose wt - diet Chest CT calcium score 0 in 2022, normal      Relevant Orders   TSH   Lipid panel   Wheezing    ENT appt is pending Loose wt - diet Chest CT calcium score 0 in 2022, normal      Obesity (BMI 30.0-34.9)    Discussed - Start [phone]  Phentermine option discussed         Meds ordered this encounter  Medications   Semaglutide-Weight Management (WEGOVY) 0.25 MG/0.5ML SOAJ    Sig: Inject 0.25 mg into the skin once a week.    Dispense:   2 mL    Refill:  2    BMI 32, OA      Follow-up: Return in about 3 months (around 01/10/2024) for a follow-up visit.  Sonda Primes, MD

## 2023-10-10 NOTE — Assessment & Plan Note (Signed)
Dr Sharl Ma Post-thyroidectomy On Levothroid

## 2023-10-13 ENCOUNTER — Ambulatory Visit (INDEPENDENT_AMBULATORY_CARE_PROVIDER_SITE_OTHER): Payer: BC Managed Care – PPO | Admitting: Psychiatry

## 2023-10-13 ENCOUNTER — Encounter: Payer: Self-pay | Admitting: Psychiatry

## 2023-10-13 DIAGNOSIS — F411 Generalized anxiety disorder: Secondary | ICD-10-CM | POA: Diagnosis not present

## 2023-10-13 DIAGNOSIS — G4726 Circadian rhythm sleep disorder, shift work type: Secondary | ICD-10-CM

## 2023-10-13 DIAGNOSIS — F3342 Major depressive disorder, recurrent, in full remission: Secondary | ICD-10-CM | POA: Diagnosis not present

## 2023-10-13 MED ORDER — CLONAZEPAM 1 MG PO TABS: 1.0000 mg | ORAL_TABLET | Freq: Two times a day (BID) | ORAL | 1 refills | Status: AC | PRN

## 2023-10-13 MED ORDER — BUSPIRONE HCL 15 MG PO TABS: ORAL_TABLET | ORAL | 1 refills | Status: AC

## 2023-10-13 MED ORDER — FLUOXETINE HCL 40 MG PO CAPS: 40.0000 mg | ORAL_CAPSULE | Freq: Every day | ORAL | 1 refills | Status: AC

## 2023-10-13 NOTE — Progress Notes (Unsigned)
Elijah Ashley [id number] 07-26-1966 57 y.o.  Subjective:   Patient ID:  Elijah Ashley is a 57 y.o. (DOB February 07, 1966) male.  Chief Complaint:  Chief Complaint  Patient presents with   Follow-up    Anxiety, depression, and sleep disturbance    HPI Elijah Ashley presents to the office today for follow-up of anxiety, depression, and sleep disturbance. He reports that he has been feeling "a little tired. I just want to sleep and stay in." He reports that he has not been meditating, stretching, and practicing yoga. He has not been able to exercise much recently. 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 He reports that he has some catastrophic thoughts related to health. He reports that his anxiety is all health-related.  He reports that he has been gaining weight and denies change in appetite and food intake. He reports that he was feeling "really sad" after recent loss and had some anger in response to some family stressors. Denies persistent irritability. He reports, "sadness is there." He reports, "I see my wife sad sometimes, and it transfers to me." Denies persistent sad mood and depression. He reports that his motivation is good towards completing tasks. He reports that he has to push himself to go into work on Sundays, "but once I get started, I am fine." He reports that his concentration is "pretty good." Denies SI.   He reports that he worked last night and is working nights. He stays up on the weekends to spend time with family. He reports that he has not been sleeping well and this may be related to switching sleep schedule between days and nights.   He has had several deaths of those close to him to include his god sister and wife's best friend- "who was also my best friend." He reports that his wife's health has been declining and he does not think  she will be able to work again.   He is trying to enjoy some things, such as music. He reports that they are now empty nesters and daughter comes home every weekend.   Klonopin last filled 08/19/23 x 2.  Past Psychiatric Medication Trials: Prozac- Increased irritability, low energy, and low libido with 60 mg dose Klonopin Nuvigil- Took when he initially started working nights Wellbutrin XL Depakote Latuda- "felt like a zombie" and shuffling gait Trazodone- Headaches Ambien  PHQ2-9    Flowsheet Row Office Visit from 10/10/2023 in [facility] Visit from 02/03/2023 in [facility] Visit from 03[facility]27/2022 in [facility] Visit from 03/20/2017 in [facility]  PHQ-2 Total Score 1 0 0 0  PHQ-9 Total Score 1 -- 0 --      Flowsheet Row ED from 12/05/2021 in [facility] Department at [facility]  C-SSRS RISK CATEGORY No Risk        Review of Systems:  Review of Systems  Respiratory:         Occ shortness of breath  Musculoskeletal:  Positive for arthralgias. Negative for gait problem.       Shoulder pain  Neurological:        Occ mild dizziness  Psychiatric/Behavioral:         Please refer to HPI    Medications: I have reviewed the  patient's current medications.  Current Outpatient Medications  Medication Sig Dispense Refill   ASHWAGANDHA PO Take by mouth. With Maca Root, Ginseng Root, and Gingko Biloba     B Complex Vitamins (B COMPLEX-B12 PO) Take by mouth.     Bacillus Coagulans-Inulin (BENEFIBER PREBIOTIC+PROBIOTIC) CHEW Chew by mouth.     busPIRone (BUSPAR) 15 MG tablet Take 1 tablet (15 mg total) by mouth 2 (two) times daily. 180 tablet 1   Cholecalciferol (VITAMIN D3) 2000 units capsule Take 1 capsule (2,000 Units total) by mouth daily. 100 capsule 3   clonazePAM (KLONOPIN) 1 MG tablet TAKE 1 TABLET BY MOUTH 2  TIMES DAILY AS NEEDED FOR ANXIETY. 180 tablet 1   fexofenadine (ALLEGRA) 180 MG tablet TAKE 1 TABLET BY MOUTH EVERY DAY 90 tablet 3   FLUoxetine (PROZAC) 40 MG capsule Take 1 capsule (40 mg total) by mouth daily. 90 capsule 0   latanoprost (XALATAN) 0.005 % ophthalmic solution Place 1 drop into both eyes at bedtime. 2.5 mL 1   levothyroxine (SYNTHROID) 150 MCG tablet Take 150 mcg by mouth every morning. Takes 1.5 tabs on Sundays     meloxicam (MOBIC) 15 MG tablet Take 1 tablet (15 mg total) by mouth daily. (Patient taking differently: Take 15 mg by mouth daily as needed.) 90 tablet 1   Moringa Oleifera (MORINGA PO) Take by mouth.     Omega-3 Fatty Acids (OMEGA-3 CF PO) Take by mouth.     pantoprazole (PROTONIX) 40 MG tablet Take 1 tablet (40 mg total) by mouth daily. 90 tablet 3   Travoprost, BAK Free, (TRAVATAN Z) 0.004 % SOLN ophthalmic solution Place 1 drop into both eyes 2 (two) times daily.     albuterol (VENTOLIN HFA) 108 (90 Base) MCG/ACT inhaler TAKE 2 PUFFS BY MOUTH EVERY 6 HOURS AS NEEDED FOR WHEEZE OR SHORTNESS OF BREATH (Patient not taking: Reported on 10/10/2023) 18 each 5   cyclobenzaprine (FLEXERIL) 10 MG tablet Take 1 tablet (10 mg total) by mouth at bedtime. (Patient not taking: Reported on 10/10/2023) 20 tablet 0   cyclobenzaprine (FLEXERIL) 5 MG tablet TAKE 1 TABLET BY MOUTH EVERY DAY AS NEEDED 30 tablet 3   loratadine (CLARITIN) 10 MG tablet Take 1 tablet (10 mg total) by mouth daily. (Patient not taking: Reported on 10/10/2023) 100 tablet 3   Semaglutide-Weight Management (WEGOVY) 0.25 MG/0.5ML SOAJ Inject 0.25 mg into the skin once a week. (Patient not taking: Reported on 10/13/2023) 2 mL 2   tadalafil (CIALIS) 20 MG tablet TAKE 0.5 - 1 TABLET BY MOUTH EVERY 3 DAYS AS NEEDED FOR ED 5 tablet 2   No current facility-administered medications for this visit.    Medication Side Effects: Other: Dry mouth  Allergies: No Known Allergies  Past Medical History:  Diagnosis Date    Allergy    Anxiety    Depression    GERD (gastroesophageal reflux disease)    past hx- some now that comes and goes   Glaucoma (increased eye pressure)    Hemorrhoids    pt states he gets regular flares with these    Hx of adenomatous polyp of colon 10/02/2017   Hyperlipidemia    slightly elevated- working on diet and exercise, no meds    HYPOTHYROIDISM    postsurgical   LOW BACK PAIN    L4-5 degen disc on MRI - s/p ESI 09/2010   Papillary thyroid carcinoma (HCC) 06/16/09 dx   total thyroidectomy for cold nodule & Graves disease   Psoriasis  URINARY RETENTION     Past Medical History, Surgical history, Social history, and Family history were reviewed and updated as appropriate.   Please see review of systems for further details on the patient's review from today.   Objective:   Physical Exam:  There were no vitals taken for this visit.  Physical Exam  Lab Review:     Component Value Date/Time   NA 137 10/10/2023 0846   K 3.8 10/10/2023 0846   CL 101 10/10/2023 0846   CO2 29 10/10/2023 0846   GLUCOSE 95 10/10/2023 0846   BUN 11 10/10/2023 0846   CREATININE 1.04 10/10/2023 0846   CALCIUM 9.1 10/10/2023 0846   PROT 7.5 10/10/2023 0846   ALBUMIN 4.3 10/10/2023 0846   AST 24 10/10/2023 0846   ALT 24 10/10/2023 0846   ALKPHOS 90 10/10/2023 0846   BILITOT 0.5 10/10/2023 0846   GFRNONAA >60 09/22/2015 2215   GFRAA >60 09/22/2015 2215       Component Value Date/Time   WBC 7.5 10/10/2023 0846   RBC 4.71 10/10/2023 0846   HGB 13.3 10/10/2023 0846   HCT 41.5 10/10/2023 0846   PLT 144.0 (L) 10/10/2023 0846   MCV 88.1 10/10/2023 0846   MCH 30.2 09/22/2015 2215   MCHC 31.9 10/10/2023 0846   RDW 14.0 10/10/2023 0846   LYMPHSABS 3.1 10/10/2023 0846   MONOABS 0.8 10/10/2023 0846   EOSABS 0.1 10/10/2023 0846   BASOSABS 0.0 10/10/2023 0846    No results found for: "POCLITH", "LITHIUM"   No results found for: "PHENYTOIN", "PHENOBARB", "VALPROATE", "CBMZ"    .res Assessment: Plan:    Discussed increase in Buspar.   There are no diagnoses linked to this encounter.   Please see After Visit Summary for patient specific instructions.  Future Appointments  Date Time Provider Department Center  10/14/2023  1:45 PM Richardean Sale, DO [facility] None  01/08/2024  9:30 AM Plotnikov, Georgina Quint, MD [facility] None    No orders of the defined types were placed in this encounter.   -------------------------------

## 2023-10-13 NOTE — Progress Notes (Deleted)
Elijah Ashley [facility] [address] [place] [address] Phone: [phone]   Assessment and Plan:     There are no diagnoses linked to this encounter.  ***   Pertinent previous records reviewed include ***   Follow Up: ***     Subjective:   I, Elijah Ashley, am serving as a Neurosurgeon for Doctor Richardean Sale   Chief Complaint: right knee pain    HPI:    02/03/23 Patient is a 57 year old male complaining of right knee pain. Patient states he has pain when he sits in a bent position, pain is on the outside of the knee, has been using voltaren gel , been going on for a coupe of months, no MOI , he drives trucks for [employer], no radiating pain , notes numbness and tingling, intermittent tylenol , he has been taking meloxicam for a while and doesn't know if it helps    02/20/2023 Patient states the pain is getting worse and moving to the medial side and behind the knee    10/14/2023 Patient states   Relevant Historical Information:   Hypothyroidism  Additional pertinent review of systems negative.   Current Outpatient Medications:    albuterol (VENTOLIN HFA) 108 (90 Base) MCG/ACT inhaler, TAKE 2 PUFFS BY MOUTH EVERY 6 HOURS AS NEEDED FOR WHEEZE OR SHORTNESS OF BREATH (Patient not taking: Reported on 10/10/2023), Disp: 18 each, Rfl: 5   ASHWAGANDHA PO, Take by mouth. With Maca Root, Ginseng Root, and Gingko Biloba, Disp: , Rfl:    B Complex Vitamins (B COMPLEX-B12 PO), Take by mouth., Disp: , Rfl:    Bacillus Coagulans-Inulin (BENEFIBER PREBIOTIC+PROBIOTIC) CHEW, Chew by mouth., Disp: , Rfl:    busPIRone (BUSPAR) 15 MG tablet, Take 1-1.5 tabs twice daily, Disp: 270 tablet, Rfl: 1   Cholecalciferol (VITAMIN D3) 2000 units capsule, Take 1 capsule (2,000 Units total) by mouth daily., Disp: 100 capsule, Rfl: 3   [START ON 11/11/2023] clonazePAM (KLONOPIN) 1 MG tablet, Take 1 tablet (1 mg total) by mouth 2 (two) times daily as needed for  anxiety., Disp: 180 tablet, Rfl: 1   cyclobenzaprine (FLEXERIL) 10 MG tablet, Take 1 tablet (10 mg total) by mouth at bedtime. (Patient not taking: Reported on 10/10/2023), Disp: 20 tablet, Rfl: 0   cyclobenzaprine (FLEXERIL) 5 MG tablet, TAKE 1 TABLET BY MOUTH EVERY DAY AS NEEDED, Disp: 30 tablet, Rfl: 3   fexofenadine (ALLEGRA) 180 MG tablet, TAKE 1 TABLET BY MOUTH EVERY DAY, Disp: 90 tablet, Rfl: 3   FLUoxetine (PROZAC) 40 MG capsule, Take 1 capsule (40 mg total) by mouth daily., Disp: 90 capsule, Rfl: 1   latanoprost (XALATAN) 0.005 % ophthalmic solution, Place 1 drop into both eyes at bedtime., Disp: 2.5 mL, Rfl: 1   levothyroxine (SYNTHROID) 150 MCG tablet, Take 150 mcg by mouth every morning. Takes 1.5 tabs on Sundays, Disp: , Rfl:    loratadine (CLARITIN) 10 MG tablet, Take 1 tablet (10 mg total) by mouth daily. (Patient not taking: Reported on 10/10/2023), Disp: 100 tablet, Rfl: 3   meloxicam (MOBIC) 15 MG tablet, Take 1 tablet (15 mg total) by mouth daily. (Patient taking differently: Take 15 mg by mouth daily as needed.), Disp: 90 tablet, Rfl: 1   Moringa Oleifera (MORINGA PO), Take by mouth., Disp: , Rfl:    Omega-3 Fatty Acids (OMEGA-3 CF PO), Take by mouth., Disp: , Rfl:    pantoprazole (PROTONIX) 40 MG tablet, Take 1 tablet (40 mg total)  by mouth daily., Disp: 90 tablet, Rfl: 3   Semaglutide-Weight Management (WEGOVY) 0.25 MG/0.5ML SOAJ, Inject 0.25 mg into the skin once a week. (Patient not taking: Reported on 10/13/2023), Disp: 2 mL, Rfl: 2   tadalafil (CIALIS) 20 MG tablet, TAKE 0.5 - 1 TABLET BY MOUTH EVERY 3 DAYS AS NEEDED FOR ED, Disp: 5 tablet, Rfl: 2   Travoprost, BAK Free, (TRAVATAN Z) 0.004 % SOLN ophthalmic solution, Place 1 drop into both eyes 2 (two) times daily., Disp: , Rfl:    Objective:     There were no vitals filed for this visit.    There is no height or weight on file to calculate BMI.    Physical Exam:    ***   Electronically signed by:  Elijah Ashley  [facility] 4:10 PM 10/13/23

## 2023-10-14 ENCOUNTER — Ambulatory Visit: Payer: BC Managed Care – PPO | Admitting: Sports Medicine

## 2023-10-20 ENCOUNTER — Encounter: Payer: Self-pay | Admitting: Internal Medicine

## 2023-10-23 ENCOUNTER — Other Ambulatory Visit: Payer: Self-pay | Admitting: Internal Medicine

## 2023-10-23 MED ORDER — CEFDINIR 300 MG PO CAPS: 300.0000 mg | ORAL_CAPSULE | Freq: Two times a day (BID) | ORAL | 0 refills | Status: DC

## 2023-10-29 ENCOUNTER — Encounter: Payer: Self-pay | Admitting: Psychiatry

## 2023-11-02 ENCOUNTER — Other Ambulatory Visit: Payer: Self-pay | Admitting: Internal Medicine

## 2023-11-05 ENCOUNTER — Other Ambulatory Visit: Payer: Self-pay | Admitting: Internal Medicine

## 2023-12-09 ENCOUNTER — Other Ambulatory Visit: Payer: Self-pay | Admitting: Psychiatry

## 2023-12-09 DIAGNOSIS — F411 Generalized anxiety disorder: Secondary | ICD-10-CM

## 2024-01-02 ENCOUNTER — Encounter: Payer: Self-pay | Admitting: Internal Medicine

## 2024-01-08 ENCOUNTER — Ambulatory Visit: Payer: BC Managed Care – PPO | Admitting: Internal Medicine

## 2024-01-17 ENCOUNTER — Other Ambulatory Visit: Payer: Self-pay | Admitting: Internal Medicine

## 2024-01-19 ENCOUNTER — Encounter: Payer: Self-pay | Admitting: Internal Medicine

## 2024-01-21 ENCOUNTER — Ambulatory Visit: Payer: BC Managed Care – PPO | Admitting: Internal Medicine

## 2024-01-21 ENCOUNTER — Encounter: Payer: Self-pay | Admitting: Internal Medicine

## 2024-01-21 VITALS — BP 118/80 | HR 77 | Temp 98.1°F | Ht 76.0 in | Wt 257.0 lb

## 2024-01-21 DIAGNOSIS — E890 Postprocedural hypothyroidism: Secondary | ICD-10-CM

## 2024-01-21 DIAGNOSIS — Z0000 Encounter for general adult medical examination without abnormal findings: Secondary | ICD-10-CM

## 2024-01-21 DIAGNOSIS — L84 Corns and callosities: Secondary | ICD-10-CM | POA: Diagnosis not present

## 2024-01-21 DIAGNOSIS — E785 Hyperlipidemia, unspecified: Secondary | ICD-10-CM | POA: Diagnosis not present

## 2024-01-21 DIAGNOSIS — N529 Male erectile dysfunction, unspecified: Secondary | ICD-10-CM

## 2024-01-21 DIAGNOSIS — F330 Major depressive disorder, recurrent, mild: Secondary | ICD-10-CM

## 2024-01-21 DIAGNOSIS — F419 Anxiety disorder, unspecified: Secondary | ICD-10-CM | POA: Diagnosis not present

## 2024-01-21 DIAGNOSIS — N320 Bladder-neck obstruction: Secondary | ICD-10-CM

## 2024-01-21 MED ORDER — TADALAFIL 20 MG PO TABS: 20.0000 mg | ORAL_TABLET | Freq: Every day | ORAL | 5 refills | Status: DC | PRN

## 2024-01-21 NOTE — Assessment & Plan Note (Signed)
Discussed ?The problem may be aggravated by SSRIs, other meds ?Try Cialis prn ?

## 2024-01-21 NOTE — Assessment & Plan Note (Signed)
 Cont on Fluoxetine, Klonopin, Buspar

## 2024-01-21 NOTE — Assessment & Plan Note (Signed)
F/u w/Dr Sharl Ma Post-thyroidectomy On Levothroid

## 2024-01-21 NOTE — Progress Notes (Signed)
 Subjective:  Patient ID: Elijah Ashley, male    DOB: [birth year]  Age: 58 y.o. MRN: [id number]  CC: Medical Management of Chronic Issues (3 MNTH F/U)   HPI Elijah Ashley presents for L little toe pain, ED, asthma  Outpatient Medications Prior to Visit  Medication Sig Dispense Refill   albuterol  (VENTOLIN  HFA) 108 (90 Base) MCG/ACT inhaler TAKE 2 PUFFS BY MOUTH EVERY 6 HOURS AS NEEDED FOR WHEEZE OR SHORTNESS OF BREATH 18 each 5   ASHWAGANDHA PO Take by mouth. With Maca Root, Ginseng Root, and Gingko Biloba     B Complex Vitamins (B COMPLEX-B12 PO) Take by mouth.     Bacillus Coagulans-Inulin (BENEFIBER PREBIOTIC+PROBIOTIC) CHEW Chew by mouth.     busPIRone  (BUSPAR ) 15 MG tablet Take 1-1.5 tabs twice daily 270 tablet 1   cefdinir  (OMNICEF ) 300 MG capsule Take 1 capsule (300 mg total) by mouth 2 (two) times daily. 20 capsule 0   Cholecalciferol (VITAMIN D3) 2000 units capsule Take 1 capsule (2,000 Units total) by mouth daily. 100 capsule 3   clonazePAM  (KLONOPIN ) 1 MG tablet Take 1 tablet (1 mg total) by mouth 2 (two) times daily as needed for anxiety. 180 tablet 1   cyclobenzaprine  (FLEXERIL ) 10 MG tablet Take 1 tablet (10 mg total) by mouth at bedtime. 20 tablet 0   cyclobenzaprine  (FLEXERIL ) 5 MG tablet TAKE 1 TABLET BY MOUTH EVERY DAY AS NEEDED 30 tablet 3   fexofenadine  (ALLEGRA ) 180 MG tablet TAKE 1 TABLET BY MOUTH EVERY DAY 90 tablet 3   FLUoxetine  (PROZAC ) 40 MG capsule Take 1 capsule (40 mg total) by mouth daily. 90 capsule 1   latanoprost  (XALATAN ) 0.005 % ophthalmic solution Place 1 drop into both eyes at bedtime. 2.5 mL 1   levothyroxine (SYNTHROID) 150 MCG tablet Take 150 mcg by mouth every morning. Takes 1.5 tabs on Sundays     meloxicam  (MOBIC ) 15 MG tablet Take 1 tablet (15 mg total) by mouth daily. (Patient taking differently: Take 15 mg by mouth daily as needed.) 90 tablet 1   Moringa Oleifera (MORINGA PO) Take by mouth.     Omega-3 Fatty Acids (OMEGA-3 CF PO)  Take by mouth.     pantoprazole  (PROTONIX ) 40 MG tablet TAKE 1 TABLET BY MOUTH EVERY DAY 90 tablet 3   Travoprost , BAK Free, (TRAVATAN  Z) 0.004 % SOLN ophthalmic solution Place 1 drop into both eyes 2 (two) times daily.     tadalafil  (CIALIS ) 20 MG tablet TAKE 0.5 - 1 TABLET BY MOUTH EVERY 3 DAYS AS NEEDED FOR ED 12 tablet 5   loratadine  (CLARITIN ) 10 MG tablet Take 1 tablet (10 mg total) by mouth daily. (Patient not taking: Reported on 01/21/2024) 100 tablet 3   No facility-administered medications prior to visit.    ROS: Review of Systems  Constitutional:  Negative for appetite change, fatigue and unexpected weight change.  HENT:  Negative for congestion, nosebleeds, sneezing, sore throat and trouble swallowing.   Eyes:  Negative for itching and visual disturbance.  Respiratory:  Negative for cough.   Cardiovascular:  Negative for chest pain, palpitations and leg swelling.  Gastrointestinal:  Negative for abdominal distention, blood in stool, diarrhea and nausea.  Genitourinary:  Negative for frequency and hematuria.  Musculoskeletal:  Negative for back pain, gait problem, joint swelling and neck pain.  Skin:  Negative for rash.  Neurological:  Negative for dizziness, tremors, speech difficulty and weakness.  Psychiatric/Behavioral:  Negative for agitation, dysphoric mood and sleep disturbance.  The patient is not nervous/anxious.     Objective:  BP 118/80 (BP Location: Right Arm, Patient Position: Sitting, Cuff Size: Normal)   Pulse 77   Temp 98.1 F (36.7 C) (Oral)   Ht 6' 4 (1.93 m)   Wt 257 lb (116.6 kg)   SpO2 96%   BMI 31.28 kg/m   BP Readings from Last 3 Encounters:  01/21/24 118/80  10/10/23 114/78  07/02/23 114/80    Wt Readings from Last 3 Encounters:  01/21/24 257 lb (116.6 kg)  10/10/23 263 lb (119.3 kg)  07/02/23 259 lb 8 oz (117.7 kg)    Physical Exam Constitutional:      General: He is not in acute distress.    Appearance: He is well-developed.      Comments: NAD  Eyes:     Conjunctiva/sclera: Conjunctivae normal.     Pupils: Pupils are equal, round, and reactive to light.  Neck:     Thyroid : No thyromegaly.     Vascular: No JVD.  Cardiovascular:     Rate and Rhythm: Normal rate and regular rhythm.     Heart sounds: Normal heart sounds. No murmur heard.    No friction rub. No gallop.  Pulmonary:     Effort: Pulmonary effort is normal. No respiratory distress.     Breath sounds: Normal breath sounds. No wheezing or rales.  Chest:     Chest wall: No tenderness.  Abdominal:     General: Bowel sounds are normal. There is no distension.     Palpations: Abdomen is soft. There is no mass.     Tenderness: There is no abdominal tenderness. There is no guarding or rebound.  Musculoskeletal:        General: No tenderness. Normal range of motion.     Cervical back: Normal range of motion.     Right lower leg: No edema.     Left lower leg: No edema.  Lymphadenopathy:     Cervical: No cervical adenopathy.  Skin:    General: Skin is warm and dry.     Findings: No rash.  Neurological:     Mental Status: He is alert and oriented to person, place, and time.     Cranial Nerves: No cranial nerve deficit.     Motor: No abnormal muscle tone.     Coordination: Coordination normal.     Gait: Gait normal.     Deep Tendon Reflexes: Reflexes are normal and symmetric.  Psychiatric:        Behavior: Behavior normal.        Thought Content: Thought content normal.        Judgment: Judgment normal.      Procedure:  L  foot callus paring/cutting - 5th toe Indication:    L  foot callus paring/cutting - 5th toe Consent: verbal  Risks and benefits were explained to the patient. Skin was cleaned with alcohol. I removed a large callus carefully with a round blade. Skin remained intact. Pain is better. Tolerated well. Complications: none. Bandaid applied   Lab Results  Component Value Date   WBC 7.5 10/10/2023   HGB 13.3 10/10/2023   HCT 41.5  10/10/2023   PLT 144.0 (L) 10/10/2023   GLUCOSE 95 10/10/2023   CHOL 218 (H) 10/10/2023   TRIG 111.0 10/10/2023   HDL 37.80 (L) 10/10/2023   LDLDIRECT 176.7 11/12/2013   LDLCALC 158 (H) 10/10/2023   ALT 24 10/10/2023   AST 24 10/10/2023   NA 137 10/10/2023  K 3.8 10/10/2023   CL 101 10/10/2023   CREATININE 1.04 10/10/2023   BUN 11 10/10/2023   CO2 29 10/10/2023   TSH 1.47 10/10/2023   PSA 0.62 02/03/2023   INR 1.0 06/14/2009   HGBA1C 5.2 05/04/2020    DG ESOPHAGUS W DOUBLE CM (HD) Result Date: 08/14/2023 CLINICAL DATA:  Dysphagia.  History of thyroid  surgery. EXAM: ESOPHOGRAM/BARIUM SWALLOW TECHNIQUE: Combined double contrast and single contrast examination performed using effervescent crystals, thick barium liquid, and thin barium liquid. FLUOROSCOPY: Radiation Exposure Index (as provided by the fluoroscopic device): 27.2 mGy Kerma COMPARISON:  None Available. FINDINGS: Scout: Visualized bilateral lungs are clear. Bilateral lateral costophrenic angles are clear. No free air under the domes of diaphragm. Double contrast views of the esophageal mucosa are normal-appearing. Esophageal motility is normal. There is no stricture, ring or web. No hiatal hernia. No spontaneous gastro-esophageal reflux and no reflux with the provocative water siphon test, coughing or Valsalva maneuver. IMPRESSION: *Normal esophagram. Electronically Signed   By: Ree Molt M.D.   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    Dispense:  12 tablet    Refill:  5      Follow-up: Return in about 6 months (around 07/20/2024) for Wellness Exam.  Marolyn Noel, MD

## 2024-01-21 NOTE — Assessment & Plan Note (Signed)
 See procedure

## 2024-01-21 NOTE — Assessment & Plan Note (Signed)
 Stable

## 2024-01-21 NOTE — Assessment & Plan Note (Signed)
 Check labs Loose wt - diet Chest CT calcium score 0 in 2022, normal

## 2024-01-21 NOTE — Assessment & Plan Note (Signed)
On Fluoxetine  

## 2024-02-17 ENCOUNTER — Other Ambulatory Visit: Payer: Self-pay | Admitting: Internal Medicine

## 2024-02-19 ENCOUNTER — Ambulatory Visit: Payer: BC Managed Care – PPO | Admitting: Mental Health

## 2024-02-19 DIAGNOSIS — F411 Generalized anxiety disorder: Secondary | ICD-10-CM | POA: Diagnosis not present

## 2024-02-19 NOTE — Progress Notes (Signed)
 [facility] psych[facility] note  Name: Elijah Ashley Date: 02/19/2024 MRN: [id number] DOB: 23-Aug-1966 PCP: Tresa Garter, MD  Time spent:  48 minutes  Treatment:  ind. therapy  Mental Status Exam:    Appearance:    Casual     Behavior:   Appropriate  Motor:   WNL  Speech/Language:    Clear and Coherent  Affect:   Full range   Mood:   Euthymic  Thought process:   Logical, linear, goal directed  Thought content:     WNL  Sensory/Perceptual disturbances:     none  Orientation:   x4  Attention:   Good  Concentration:   Good  Memory:   Intact  Fund of knowledge:    Consistent with age and development  Insight:     Good  Judgment:    Good  Impulse Control:   Good     Reported Symptoms:  some sadness, anxiety  Risk Assessment: Danger to Self:  No Self-injurious Behavior: No Danger to Others: No Duty to Warn:no Physical Aggression / Violence:No  Access to Firearms a concern: No  Gang Involvement:No  Patient / guardian was educated about steps to take if suicide or homicide risk level increases between visits: yes While future psychiatric events cannot be accurately predicted, the patient does not currently require acute inpatient psychiatric care and does not currently meet [facility] involuntary commitment criteria.   Subjective: Patient arrived on time for today's session.  Patient shared progress, stated that he has a lot of various stressors currently.  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  He went on to process issues related to family, his stepson recently communicating back again with his wife.  He went on to share how he said his stepson a message about 2 months ago but did not hear back from him.  He expresses a desire to work on their relationship while also the need for them to talk through their issues.  Patient went on to share details, some past issues over the few years that have made it  particularly challenging in this relationship.  Collaboratively, explored ways to navigate steps forward, he plans to send 1 more text to his stepson in the hopes that he will communicate in some manner.  He shared his desire for the relationship to improve.  He went on to share other stressors, his wife continuing to cope with her chronic medical issues and some new developments that are upsetting.  Support was provided as well as space for patient to process feelings related.     Interventions: Supportive therapy, motivational interviewing   Diagnoses:    ICD-10-CM   1. Generalized anxiety disorder  F41.1          Plan: Patient is to use CBT, mindfulness and coping skills.  Utilize his support system and engaging pleasurable activities, such as playing his bass and other instruments, exercising.   Long-term goal:   Reduce overall level, frequency, and intensity of the feelings of anxiety at least 3 consecutive months per patient report.    Short-term goal:  Decrease worry about life stressors and improve coping to manage anxiety levels Make efforts to be consistent with self-care.  Engage in interests such as playing his base and other musical instruments, adhere to engaging in meditation    Assessment of progress:  progressing   This record has been created using [employer].  Chart creation errors have  been sought, but may not always have been located and corrected. Such creation errors do not reflect on the standard of medical care.   Waldron Session, [facility]

## 2024-04-23 ENCOUNTER — Other Ambulatory Visit: Payer: Self-pay | Admitting: Internal Medicine

## 2024-06-16 ENCOUNTER — Encounter: Payer: Self-pay | Admitting: Internal Medicine

## 2024-06-16 ENCOUNTER — Ambulatory Visit: Admitting: Internal Medicine

## 2024-06-16 VITALS — BP 95/60 | HR 77 | Temp 97.9°F | Ht 76.0 in | Wt 252.0 lb

## 2024-06-16 DIAGNOSIS — G4726 Circadian rhythm sleep disorder, shift work type: Secondary | ICD-10-CM

## 2024-06-16 DIAGNOSIS — M67449 Ganglion, unspecified hand: Secondary | ICD-10-CM

## 2024-06-16 DIAGNOSIS — Z125 Encounter for screening for malignant neoplasm of prostate: Secondary | ICD-10-CM

## 2024-06-16 DIAGNOSIS — M545 Low back pain, unspecified: Secondary | ICD-10-CM

## 2024-06-16 DIAGNOSIS — Z0000 Encounter for general adult medical examination without abnormal findings: Secondary | ICD-10-CM

## 2024-06-16 DIAGNOSIS — G8929 Other chronic pain: Secondary | ICD-10-CM

## 2024-06-16 DIAGNOSIS — E785 Hyperlipidemia, unspecified: Secondary | ICD-10-CM | POA: Diagnosis not present

## 2024-06-16 DIAGNOSIS — F330 Major depressive disorder, recurrent, mild: Secondary | ICD-10-CM | POA: Diagnosis not present

## 2024-06-16 LAB — CBC WITH DIFFERENTIAL/PLATELET
Basophils Absolute: 0 10*3/uL (ref 0.0–0.1)
Basophils Relative: 0.7 % (ref 0.0–3.0)
Eosinophils Absolute: 0.1 10*3/uL (ref 0.0–0.7)
Eosinophils Relative: 1 % (ref 0.0–5.0)
HCT: 42.2 % (ref 39.0–52.0)
Hemoglobin: 13.6 g/dL (ref 13.0–17.0)
Lymphocytes Relative: 43.5 % (ref 12.0–46.0)
Lymphs Abs: 2.9 10*3/uL (ref 0.7–4.0)
MCHC: 32.3 g/dL (ref 30.0–36.0)
MCV: 86 fl (ref 78.0–100.0)
Monocytes Absolute: 0.7 10*3/uL (ref 0.1–1.0)
Monocytes Relative: 9.8 % (ref 3.0–12.0)
Neutro Abs: 3 10*3/uL (ref 1.4–7.7)
Neutrophils Relative %: 45 % (ref 43.0–77.0)
Platelets: 153 10*3/uL (ref 150.0–400.0)
RBC: 4.91 Mil/uL (ref 4.22–5.81)
RDW: 14.2 % (ref 11.5–15.5)
WBC: 6.7 10*3/uL (ref 4.0–10.5)

## 2024-06-16 LAB — URINALYSIS
Bilirubin Urine: NEGATIVE
Hgb urine dipstick: NEGATIVE
Ketones, ur: NEGATIVE
Leukocytes,Ua: NEGATIVE
Nitrite: NEGATIVE
Specific Gravity, Urine: <=1.005 — AB (ref 1.000–1.030)
Total Protein, Urine: NEGATIVE
Urine Glucose: NEGATIVE
Urobilinogen, UA: 0.2 (ref 0.0–1.0)
pH: 7 (ref 5.0–8.0)

## 2024-06-16 LAB — COMPREHENSIVE METABOLIC PANEL WITH GFR
ALT: 19 U/L (ref 0–53)
AST: 18 U/L (ref 0–37)
Albumin: 4.3 g/dL (ref 3.5–5.2)
Alkaline Phosphatase: 96 U/L (ref 39–117)
BUN: 8 mg/dL (ref 6–23)
CO2: 28 meq/L (ref 19–32)
Calcium: 9 mg/dL (ref 8.4–10.5)
Chloride: 105 meq/L (ref 96–112)
Creatinine, Ser: 0.99 mg/dL (ref 0.40–1.50)
GFR: 84.99 mL/min
Glucose, Bld: 85 mg/dL (ref 70–99)
Potassium: 4.2 meq/L (ref 3.5–5.1)
Sodium: 138 meq/L (ref 135–145)
Total Bilirubin: 0.5 mg/dL (ref 0.2–1.2)
Total Protein: 7.1 g/dL (ref 6.0–8.3)

## 2024-06-16 LAB — TSH: TSH: 0.61 u[IU]/mL (ref 0.35–5.50)

## 2024-06-16 LAB — LIPID PANEL
Cholesterol: 212 mg/dL — ABNORMAL HIGH (ref 0–200)
HDL: 35.4 mg/dL — ABNORMAL LOW
LDL Cholesterol: 142 mg/dL — ABNORMAL HIGH (ref 0–99)
NonHDL: 176.17
Total CHOL/HDL Ratio: 6
Triglycerides: 173 mg/dL — ABNORMAL HIGH (ref 0.0–149.0)
VLDL: 34.6 mg/dL (ref 0.0–40.0)

## 2024-06-16 LAB — PSA: PSA: 0.8 ng/mL (ref 0.10–4.00)

## 2024-06-16 MED ORDER — ZOLPIDEM TARTRATE 10 MG PO TABS: 5.0000 mg | ORAL_TABLET | Freq: Every evening | ORAL | 2 refills | Status: DC | PRN

## 2024-06-16 NOTE — Assessment & Plan Note (Signed)
 L thumb, small and NT Will watch

## 2024-06-16 NOTE — Assessment & Plan Note (Signed)
 Check labs Loose wt - diet Chest CT calcium score 0 in 2022, normal

## 2024-06-16 NOTE — Assessment & Plan Note (Deleted)
  We discussed age appropriate health related issues, including available/recomended screening tests and vaccinations. Labs were ordered to be later reviewed . All questions were answered. We discussed one or more of the following - seat belt use, use of sunscreen/sun exposure exercise, fall risk reduction, second hand smoke exposure, firearm use and storage, seat belt use, a need for adhering to healthy diet and exercise. Labs were ordered.  All questions were answered. 2022 Coronary calcium CT score - 0 Colon 2018, due in 2024 Dr Carlean Purl

## 2024-06-16 NOTE — Assessment & Plan Note (Signed)
 Check labs

## 2024-06-16 NOTE — Assessment & Plan Note (Signed)
On Fluoxetine  

## 2024-06-16 NOTE — Assessment & Plan Note (Signed)
 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 Other options were discussed - Provigil

## 2024-06-16 NOTE — Progress Notes (Signed)
 Subjective:  Patient ID: Elijah Ashley, male    DOB: November 24, 1966  Age: 58 y.o. MRN: [id number]  CC: Hemorrhoids (Hemorrhoids (notes sometimes blood present with wiping), insomnia (patient notes that they work 3rd shift, on vacations patient is only able to really get about 3 hours of sleep, is able to treat with muscle relaxer). Interested in labs, specifically for pancreas)   HPI Elijah Ashley presents for blood in stool C/o stress - wife is in the hospital w/diverticulitis complications C/o insomnia - gets 3 h of sleep per day only C/o low BP?  Outpatient Medications Prior to Visit  Medication Sig Dispense Refill   albuterol  (VENTOLIN  HFA) 108 (90 Base) MCG/ACT inhaler TAKE 2 PUFFS BY MOUTH EVERY 6 HOURS AS NEEDED FOR WHEEZE OR SHORTNESS OF BREATH 18 each 5   ASHWAGANDHA PO Take by mouth. With Maca Root, Ginseng Root, and Gingko Biloba     B Complex Vitamins (B COMPLEX-B12 PO) Take by mouth.     Bacillus Coagulans-Inulin (BENEFIBER PREBIOTIC+PROBIOTIC) CHEW Chew by mouth.     busPIRone  (BUSPAR ) 15 MG tablet Take 1-1.5 tabs twice daily 270 tablet 1   cefdinir  (OMNICEF ) 300 MG capsule Take 1 capsule (300 mg total) by mouth 2 (two) times daily. 20 capsule 0   Cholecalciferol (VITAMIN D3) 2000 units capsule Take 1 capsule (2,000 Units total) by mouth daily. 100 capsule 3   clonazePAM  (KLONOPIN ) 1 MG tablet Take 1 tablet (1 mg total) by mouth 2 (two) times daily as needed for anxiety. 180 tablet 1   cyclobenzaprine  (FLEXERIL ) 10 MG tablet Take 1 tablet (10 mg total) by mouth at bedtime. 20 tablet 0   cyclobenzaprine  (FLEXERIL ) 5 MG tablet TAKE 1 TABLET BY MOUTH EVERY DAY AS NEEDED 30 tablet 1   dorzolamide-timolol (COSOPT) 2-0.5 % ophthalmic solution SMARTSIG:In Eye(s)     fexofenadine  (ALLEGRA ) 180 MG tablet TAKE 1 TABLET BY MOUTH EVERY DAY 90 tablet 3   FLUoxetine  (PROZAC ) 40 MG capsule Take 1 capsule (40 mg total) by mouth daily. 90 capsule 1   latanoprost  (XALATAN ) 0.005 %  ophthalmic solution Place 1 drop into both eyes at bedtime. 2.5 mL 1   levothyroxine (SYNTHROID) 150 MCG tablet Take 150 mcg by mouth every morning. Takes 1.5 tabs on Sundays     meloxicam  (MOBIC ) 15 MG tablet Take 1 tablet (15 mg total) by mouth daily as needed. 30 tablet 1   Moringa Oleifera (MORINGA PO) Take by mouth.     Omega-3 Fatty Acids (OMEGA-3 CF PO) Take by mouth.     pantoprazole  (PROTONIX ) 40 MG tablet TAKE 1 TABLET BY MOUTH EVERY DAY 90 tablet 3   tadalafil  (CIALIS ) 20 MG tablet Take 1 tablet (20 mg total) by mouth daily as needed for erectile dysfunction. 12 tablet 5   Travoprost , BAK Free, (TRAVATAN  Z) 0.004 % SOLN ophthalmic solution Place 1 drop into both eyes 2 (two) times daily.     loratadine  (CLARITIN ) 10 MG tablet Take 1 tablet (10 mg total) by mouth daily. (Patient not taking: Reported on 06/16/2024) 100 tablet 3   No facility-administered medications prior to visit.    ROS: Review of Systems  Constitutional:  Negative for appetite change, fatigue and unexpected weight change.  HENT:  Negative for congestion, nosebleeds, sneezing, sore throat and trouble swallowing.   Eyes:  Negative for itching and visual disturbance.  Respiratory:  Negative for cough.   Cardiovascular:  Negative for chest pain, palpitations and leg swelling.  Gastrointestinal:  Negative  for abdominal distention, blood in stool, diarrhea and nausea.  Genitourinary:  Negative for frequency and hematuria.  Musculoskeletal:  Negative for back pain, gait problem, joint swelling and neck pain.  Skin:  Negative for rash.  Neurological:  Negative for dizziness, tremors, speech difficulty and weakness.  Psychiatric/Behavioral:  Positive for sleep disturbance. Negative for agitation and dysphoric mood. The patient is not nervous/anxious.     Objective:  BP 95/60 (Cuff Size: Large)   Pulse 77   Temp 97.9 F (36.6 C)   Ht 6' 4 (1.93 m)   Wt 252 lb (114.3 kg)   SpO2 96%   BMI 30.67 kg/m   BP  Readings from Last 3 Encounters:  06/16/24 95/60  01/21/24 118/80  10/10/23 114/78    Wt Readings from Last 3 Encounters:  06/16/24 252 lb (114.3 kg)  01/21/24 257 lb (116.6 kg)  10/10/23 263 lb (119.3 kg)    Physical Exam Constitutional:      General: He is not in acute distress.    Appearance: He is well-developed.     Comments: NAD  Eyes:     Conjunctiva/sclera: Conjunctivae normal.     Pupils: Pupils are equal, round, and reactive to light.  Neck:     Thyroid : No thyromegaly.     Vascular: No JVD.  Cardiovascular:     Rate and Rhythm: Normal rate and regular rhythm.     Heart sounds: Normal heart sounds. No murmur heard.    No friction rub. No gallop.  Pulmonary:     Effort: Pulmonary effort is normal. No respiratory distress.     Breath sounds: Normal breath sounds. No wheezing or rales.  Chest:     Chest wall: No tenderness.  Abdominal:     General: Bowel sounds are normal. There is no distension.     Palpations: Abdomen is soft. There is no mass.     Tenderness: There is no abdominal tenderness. There is no guarding or rebound.  Musculoskeletal:        General: No tenderness. Normal range of motion.     Cervical back: Normal range of motion.  Lymphadenopathy:     Cervical: No cervical adenopathy.  Skin:    General: Skin is warm and dry.     Findings: No rash.  Neurological:     Mental Status: He is alert and oriented to person, place, and time.     Cranial Nerves: No cranial nerve deficit.     Motor: No abnormal muscle tone.     Coordination: Coordination normal.     Gait: Gait normal.     Deep Tendon Reflexes: Reflexes are normal and symmetric.  Psychiatric:        Behavior: Behavior normal.        Thought Content: Thought content normal.        Judgment: Judgment normal.   Lpalmar thumb base, small and NT 5 mm cyst   Lab Results  Component Value Date   WBC 6.7 06/16/2024   HGB 13.6 06/16/2024   HCT 42.2 06/16/2024   PLT 153.0 06/16/2024    GLUCOSE 85 06/16/2024   CHOL 212 (H) 06/16/2024   TRIG 173.0 (H) 06/16/2024   HDL 35.40 (L) 06/16/2024   LDLDIRECT 176.7 11/12/2013   LDLCALC 142 (H) 06/16/2024   ALT 19 06/16/2024   AST 18 06/16/2024   NA 138 06/16/2024   K 4.2 06/16/2024   CL 105 06/16/2024   CREATININE 0.99 06/16/2024   BUN 8 06/16/2024  CO2 28 06/16/2024   TSH 0.61 06/16/2024   PSA 0.80 06/16/2024   INR 1.0 06/14/2009   HGBA1C 5.2 05/04/2020    DG ESOPHAGUS W DOUBLE CM (HD) Result Date: 08/14/2023 CLINICAL DATA:  Dysphagia.  History of thyroid  surgery. EXAM: ESOPHOGRAM/BARIUM SWALLOW TECHNIQUE: Combined double contrast and single contrast examination performed using effervescent crystals, thick barium liquid, and thin barium liquid. FLUOROSCOPY: Radiation Exposure Index (as provided by the fluoroscopic device): 27.2 mGy Kerma COMPARISON:  None Available. FINDINGS: Scout: Visualized bilateral lungs are clear. Bilateral lateral costophrenic angles are clear. No free air under the domes of diaphragm. Double contrast views of the esophageal mucosa are normal-appearing. Esophageal motility is normal. There is no stricture, ring or web. No hiatal hernia. No spontaneous gastro-esophageal reflux and no reflux with the provocative water siphon test, coughing or Valsalva maneuver. IMPRESSION: *Normal esophagram. Electronically Signed   By: Ree Molt M.D.   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 Other options were discussed - Provigil       Ganglion cyst of finger   L thumb, small and NT Will watch         Meds ordered this encounter  Medications   zolpidem (AMBIEN) 10 MG tablet    Sig: Take 0.5-1 tablets (5-10 mg total) by mouth at bedtime as needed for sleep.    Dispense:  30 tablet    Refill:  2      Follow-up: Return in about 6 months (around 12/17/2024) for a follow-up visit.  Marolyn Noel, MD

## 2024-06-20 ENCOUNTER — Ambulatory Visit: Payer: Self-pay | Admitting: Internal Medicine

## 2024-06-23 ENCOUNTER — Other Ambulatory Visit: Payer: Self-pay | Admitting: Internal Medicine

## 2024-07-26 ENCOUNTER — Ambulatory Visit: Payer: Self-pay

## 2024-07-26 NOTE — Telephone Encounter (Signed)
 FYI Only or Action Required?: Action required by provider: request for appointment.  Patient was last seen in primary care on 06/16/2024 by Plotnikov, Karlynn GAILS, MD.  Called Nurse Triage reporting Foot Pain.  Symptoms began several months ago.  Interventions attempted: Nothing.  Symptoms are: gradually worsening.  Triage Disposition: See PCP When Office is Open (Within 3 Days)  Patient/caregiver understands and will follow disposition?: YesCopied from CRM #[id number]. Topic: Clinical - Red Word Triage >> Jul 26, 2024 12:59 PM Viola F wrote: Red Word that prompted transfer to Nurse Triage: Patient having pain in fingers, hands and toes Reason for Disposition  [1] MODERATE pain (e.g., interferes with normal activities, limping) AND [2] present > 3 days  Answer Assessment - Initial Assessment Questions Pt is having constant nerve pain in right foot and hands/fingers. This has been going on awhile. Pt only wants to see PCP   1. ONSET: When did the pain start?      Long time 2. LOCATION: Where is the pain located?      Right foot 3. PAIN: How bad is the pain?    (Scale 1-10; or mild, moderate, severe)     6 4. WORK OR EXERCISE: Has there been any recent work or exercise that involved this part of the body?      na 5. CAUSE: What do you think is causing the foot pain?     nerve 6. OTHER SYMPTOMS: Do you have any other symptoms? (e.g., leg pain, rash, fever, numbness)     Nerve pain in hands and fingers  Protocols used: Foot Pain-A-AH

## 2024-07-28 ENCOUNTER — Ambulatory Visit: Payer: Self-pay

## 2024-07-28 NOTE — Telephone Encounter (Signed)
 Pt states that he would like to reschedule his appt for tomorrow to next week Tuesday or Wednesday. Pt states that there has been no change in his condition. Pt advised that our recommendation was that he be seen within 3 days from initial triage. Pt states that he understands and is still wanting to reschedule. Pt only willing to see PCP. Rescheduled for 8/20 at pt request.   Copied from CRM #[id number]. Topic: Clinical - Red Word Triage >> Jul 28, 2024  8:27 AM Adelita BRAVO wrote: [facility] that prompted transfer to Nurse Triage: Both hands go numb when sleeping, and increased sensitivity in both feet. Been going on several months, but progressively getting worse.

## 2024-07-29 ENCOUNTER — Ambulatory Visit: Admitting: Internal Medicine

## 2024-07-29 NOTE — Telephone Encounter (Signed)
 Noted.  Okay to reschedule.  Thank you

## 2024-08-04 ENCOUNTER — Ambulatory Visit: Admitting: Internal Medicine

## 2024-08-05 ENCOUNTER — Ambulatory Visit (INDEPENDENT_AMBULATORY_CARE_PROVIDER_SITE_OTHER)

## 2024-08-05 ENCOUNTER — Ambulatory Visit (INDEPENDENT_AMBULATORY_CARE_PROVIDER_SITE_OTHER): Admitting: Internal Medicine

## 2024-08-05 VITALS — BP 114/82 | HR 77 | Temp 98.0°F | Ht 76.0 in | Wt 254.0 lb

## 2024-08-05 DIAGNOSIS — R140 Abdominal distension (gaseous): Secondary | ICD-10-CM

## 2024-08-05 DIAGNOSIS — L739 Follicular disorder, unspecified: Secondary | ICD-10-CM

## 2024-08-05 DIAGNOSIS — K219 Gastro-esophageal reflux disease without esophagitis: Secondary | ICD-10-CM

## 2024-08-05 DIAGNOSIS — F411 Generalized anxiety disorder: Secondary | ICD-10-CM

## 2024-08-05 DIAGNOSIS — L84 Corns and callosities: Secondary | ICD-10-CM | POA: Diagnosis not present

## 2024-08-05 DIAGNOSIS — M2559 Pain in other specified joint: Secondary | ICD-10-CM

## 2024-08-05 MED ORDER — MELOXICAM 15 MG PO TABS: 15.0000 mg | ORAL_TABLET | Freq: Every day | ORAL | 1 refills | Status: DC | PRN

## 2024-08-05 NOTE — Assessment & Plan Note (Signed)
On Prozac

## 2024-08-05 NOTE — Progress Notes (Signed)
 Subjective:  Patient ID: Elijah Ashley, male    DOB: 06-Jul-1966  Age: 58 y.o. MRN: [id number]  CC: Numbness (Patient here for numbness in his hands and legs. Patient states mainly both legs/feet his arm get numb when in bed. He states this has been going on for awhile, he mentioned having C3 and C-4 fused and can feel the numbness starts. He is not taking anything for the numbness and pain only using a tennis ball to work out the tension )   HPI [employer] presents for L little toe callus   C/o numbness (Patient here for numbness in his hands and legs. Patient states mainly both legs/feet his arm get numb when in bed. He states this has been going on for awhile, he mentioned having C3 and C-4 fused and can feel the numbness starts. He is not taking anything for the numbness and pain only using a tennis ball to work out the tension )  C/o pimples on neck  Outpatient Medications Prior to Visit  Medication Sig Dispense Refill   albuterol  (VENTOLIN  HFA) 108 (90 Base) MCG/ACT inhaler TAKE 2 PUFFS BY MOUTH EVERY 6 HOURS AS NEEDED FOR WHEEZE OR SHORTNESS OF BREATH 18 each 5   ASHWAGANDHA PO Take by mouth. With Maca Root, Ginseng Root, and Gingko Biloba     B Complex Vitamins (B COMPLEX-B12 PO) Take by mouth.     Bacillus Coagulans-Inulin (BENEFIBER PREBIOTIC+PROBIOTIC) CHEW Chew by mouth.     busPIRone  (BUSPAR ) 15 MG tablet Take 1-1.5 tabs twice daily 270 tablet 1   Cholecalciferol (VITAMIN D3) 2000 units capsule Take 1 capsule (2,000 Units total) by mouth daily. 100 capsule 3   clonazePAM  (KLONOPIN ) 1 MG tablet Take 1 tablet (1 mg total) by mouth 2 (two) times daily as needed for anxiety. 180 tablet 1   cyclobenzaprine  (FLEXERIL ) 10 MG tablet Take 1 tablet (10 mg total) by mouth at bedtime. 20 tablet 0   cyclobenzaprine  (FLEXERIL ) 5 MG tablet TAKE 1 TABLET BY MOUTH EVERY DAY AS NEEDED 30 tablet 1   dorzolamide-timolol (COSOPT) 2-0.5 % ophthalmic solution SMARTSIG:In Eye(s)      fexofenadine  (ALLEGRA ) 180 MG tablet TAKE 1 TABLET BY MOUTH EVERY DAY 90 tablet 3   FLUoxetine  (PROZAC ) 40 MG capsule Take 1 capsule (40 mg total) by mouth daily. 90 capsule 1   latanoprost  (XALATAN ) 0.005 % ophthalmic solution Place 1 drop into both eyes at bedtime. 2.5 mL 1   levothyroxine (SYNTHROID) 150 MCG tablet Take 150 mcg by mouth every morning. Takes 1.5 tabs on Sundays     Moringa Oleifera (MORINGA PO) Take by mouth.     Omega-3 Fatty Acids (OMEGA-3 CF PO) Take by mouth.     pantoprazole  (PROTONIX ) 40 MG tablet TAKE 1 TABLET BY MOUTH EVERY DAY 90 tablet 3   tadalafil  (CIALIS ) 20 MG tablet Take 1 tablet (20 mg total) by mouth daily as needed for erectile dysfunction. 12 tablet 5   Travoprost , BAK Free, (TRAVATAN  Z) 0.004 % SOLN ophthalmic solution Place 1 drop into both eyes 2 (two) times daily.     zolpidem  (AMBIEN ) 10 MG tablet Take 0.5-1 tablets (5-10 mg total) by mouth at bedtime as needed for sleep. 30 tablet 2   cefdinir  (OMNICEF ) 300 MG capsule Take 1 capsule (300 mg total) by mouth 2 (two) times daily. 20 capsule 0   meloxicam  (MOBIC ) 15 MG tablet Take 1 tablet (15 mg total) by mouth daily as needed. 30 tablet 1  loratadine  (CLARITIN ) 10 MG tablet Take 1 tablet (10 mg total) by mouth daily. (Patient not taking: Reported on 08/05/2024) 100 tablet 3   No facility-administered medications prior to visit.    ROS: Review of Systems  Constitutional:  Negative for appetite change, fatigue and unexpected weight change.  HENT:  Negative for congestion, nosebleeds, sneezing, sore throat and trouble swallowing.   Eyes:  Negative for itching and visual disturbance.  Respiratory:  Negative for cough.   Cardiovascular:  Negative for chest pain, palpitations and leg swelling.  Gastrointestinal:  Positive for abdominal distention and constipation. Negative for blood in stool, diarrhea, nausea and vomiting.  Genitourinary:  Negative for frequency and hematuria.  Musculoskeletal:  Positive  for arthralgias and back pain. Negative for gait problem, joint swelling and neck pain.  Skin:  Negative for rash.  Neurological:  Negative for dizziness, tremors, speech difficulty and weakness.  Hematological:  Does not bruise/bleed easily.  Psychiatric/Behavioral:  Negative for agitation, dysphoric mood and sleep disturbance. The patient is not nervous/anxious.     Objective:  BP 114/82 (BP Location: Left Arm, Patient Position: Sitting, Cuff Size: Normal)   Pulse 77   Temp 98 F (36.7 C) (Oral)   Ht 6' 4 (1.93 m)   Wt 254 lb (115.2 kg)   SpO2 98%   BMI 30.92 kg/m   BP Readings from Last 3 Encounters:  08/05/24 114/82  06/16/24 95/60  01/21/24 118/80    Wt Readings from Last 3 Encounters:  08/05/24 254 lb (115.2 kg)  06/16/24 252 lb (114.3 kg)  01/21/24 257 lb (116.6 kg)    Physical Exam Constitutional:      General: He is not in acute distress.    Appearance: He is well-developed.     Comments: NAD  Eyes:     Conjunctiva/sclera: Conjunctivae normal.     Pupils: Pupils are equal, round, and reactive to light.  Neck:     Thyroid : No thyromegaly.     Vascular: No JVD.  Cardiovascular:     Rate and Rhythm: Normal rate and regular rhythm.     Heart sounds: Normal heart sounds. No murmur heard.    No friction rub. No gallop.  Pulmonary:     Effort: Pulmonary effort is normal. No respiratory distress.     Breath sounds: Normal breath sounds. No wheezing or rales.  Chest:     Chest wall: No tenderness.  Abdominal:     General: Bowel sounds are normal. There is no distension.     Palpations: Abdomen is soft. There is no mass.     Tenderness: There is no abdominal tenderness. There is no guarding or rebound.  Musculoskeletal:        General: No tenderness. Normal range of motion.     Cervical back: Normal range of motion.     Right lower leg: No edema.     Left lower leg: No edema.  Lymphadenopathy:     Cervical: No cervical adenopathy.  Skin:    General: Skin  is warm and dry.     Findings: No rash.  Neurological:     Mental Status: He is alert and oriented to person, place, and time.     Cranial Nerves: No cranial nerve deficit.     Motor: No weakness or abnormal muscle tone.     Coordination: Coordination normal.     Gait: Gait normal.     Deep Tendon Reflexes: Reflexes are normal and symmetric.  Psychiatric:  Behavior: Behavior normal.        Thought Content: Thought content normal.        Judgment: Judgment normal.   Neuroexam is nonfocal Folliculitis on the posterior neck is present  L little toe callus w/pain  Procedure: L   foot callus paring/cutting  Indication:    foot callus, painful  Consent: verbal  Risks and benefits were explained to the patient. Skin was cleaned with alcohol. I removed a large callus carefully with a round blade. Skin remained intact. Pain is better. Tolerated well. Complications: none. Bandaid applied   Lab Results  Component Value Date   WBC 6.7 06/16/2024   HGB 13.6 06/16/2024   HCT 42.2 06/16/2024   PLT 153.0 06/16/2024   GLUCOSE 85 06/16/2024   CHOL 212 (H) 06/16/2024   TRIG 173.0 (H) 06/16/2024   HDL 35.40 (L) 06/16/2024   LDLDIRECT 176.7 11/12/2013   LDLCALC 142 (H) 06/16/2024   ALT 19 06/16/2024   AST 18 06/16/2024   NA 138 06/16/2024   K 4.2 06/16/2024   CL 105 06/16/2024   CREATININE 0.99 06/16/2024   BUN 8 06/16/2024   CO2 28 06/16/2024   TSH 0.61 06/16/2024   PSA 0.80 06/16/2024   INR 1.0 06/14/2009   HGBA1C 5.2 05/04/2020    DG ESOPHAGUS W DOUBLE CM (HD) Result Date: 08/14/2023 CLINICAL DATA:  Dysphagia.  History of thyroid  surgery. EXAM: ESOPHOGRAM/BARIUM SWALLOW TECHNIQUE: Combined double contrast and single contrast examination performed using effervescent crystals, thick barium liquid, and thin barium liquid. FLUOROSCOPY: Radiation Exposure Index (as provided by the fluoroscopic device): 27.2 mGy Kerma COMPARISON:  None Available. FINDINGS: Scout: Visualized bilateral  lungs are clear. Bilateral lateral costophrenic angles are clear. No free air under the domes of diaphragm. Double contrast views of the esophageal mucosa are normal-appearing. Esophageal motility is normal. There is no stricture, ring or web. No hiatal hernia. No spontaneous gastro-esophageal reflux and no reflux with the provocative water siphon test, coughing or Valsalva maneuver. IMPRESSION: *Normal esophagram. Electronically Signed   By: Ree Molt M.D.   On: 08/14/2023 10:59    Assessment & Plan:   Problem List Items Addressed This Visit     Arthralgia   Meloxicam  as needed PC      Bloating - Primary   Most likely due to gas/constipation Obtain abdominal x-ray Use MiraLAX for cleansing as needed Use a probiotic       Relevant Orders   DG Abd 2 Views (Completed)   Callus   Relapsed See procedure      Folliculitis   Posterior neck location.  Clindamycin  gel topically Acne wash to use      Gastroesophageal reflux disease   On Protonix  daily      Generalized anxiety disorder   On Prozac          Meds ordered this encounter  Medications   meloxicam  (MOBIC ) 15 MG tablet    Sig: Take 1 tablet (15 mg total) by mouth daily as needed.    Dispense:  30 tablet    Refill:  1      Follow-up: Return in about 3 months (around 11/05/2024) for a follow-up visit.  Marolyn Noel, MD

## 2024-08-05 NOTE — Assessment & Plan Note (Signed)
Relapsed See procedure 

## 2024-08-06 ENCOUNTER — Encounter: Payer: Self-pay | Admitting: Internal Medicine

## 2024-08-08 ENCOUNTER — Ambulatory Visit: Payer: Self-pay | Admitting: Internal Medicine

## 2024-08-08 ENCOUNTER — Other Ambulatory Visit: Payer: Self-pay | Admitting: Internal Medicine

## 2024-08-08 ENCOUNTER — Encounter: Payer: Self-pay | Admitting: Internal Medicine

## 2024-08-08 DIAGNOSIS — R140 Abdominal distension (gaseous): Secondary | ICD-10-CM | POA: Insufficient documentation

## 2024-08-08 DIAGNOSIS — L739 Follicular disorder, unspecified: Secondary | ICD-10-CM | POA: Insufficient documentation

## 2024-08-08 MED ORDER — CLINDAMYCIN PHOS-BENZOYL PEROX 1-5 % EX GEL: Freq: Two times a day (BID) | CUTANEOUS | 3 refills | Status: AC

## 2024-08-08 NOTE — Assessment & Plan Note (Signed)
 Most likely due to gas/constipation Obtain abdominal x-ray Use MiraLAX for cleansing as needed Use a probiotic

## 2024-08-08 NOTE — Assessment & Plan Note (Addendum)
 Meloxicam  as needed PC

## 2024-08-08 NOTE — Assessment & Plan Note (Signed)
 On Protonix  daily

## 2024-08-08 NOTE — Assessment & Plan Note (Signed)
 Posterior neck folliculitis Use benzoyl peroxide wash Clindamycin  gel prescribed for daily use.

## 2024-08-08 NOTE — Assessment & Plan Note (Signed)
 Posterior neck location.  Clindamycin  gel topically Acne wash to use

## 2024-08-30 ENCOUNTER — Encounter: Payer: Self-pay | Admitting: Internal Medicine

## 2024-09-01 ENCOUNTER — Other Ambulatory Visit: Payer: Self-pay | Admitting: Internal Medicine

## 2024-09-01 MED ORDER — GABAPENTIN 100 MG PO CAPS: 100.0000 mg | ORAL_CAPSULE | Freq: Three times a day (TID) | ORAL | 3 refills | Status: DC | PRN

## 2024-10-18 ENCOUNTER — Other Ambulatory Visit: Payer: Self-pay | Admitting: Internal Medicine

## 2024-10-27 ENCOUNTER — Encounter: Payer: Self-pay | Admitting: Internal Medicine

## 2024-10-27 ENCOUNTER — Ambulatory Visit: Admitting: Internal Medicine

## 2024-10-27 VITALS — BP 116/84 | HR 70 | Temp 98.0°F | Ht 76.0 in | Wt 252.2 lb

## 2024-10-27 DIAGNOSIS — R10A2 Flank pain, left side: Secondary | ICD-10-CM

## 2024-10-27 DIAGNOSIS — N410 Acute prostatitis: Secondary | ICD-10-CM | POA: Diagnosis not present

## 2024-10-27 DIAGNOSIS — N320 Bladder-neck obstruction: Secondary | ICD-10-CM

## 2024-10-27 DIAGNOSIS — N419 Inflammatory disease of prostate, unspecified: Secondary | ICD-10-CM | POA: Insufficient documentation

## 2024-10-27 MED ORDER — CIPROFLOXACIN HCL 500 MG PO TABS: 500.0000 mg | ORAL_TABLET | Freq: Two times a day (BID) | ORAL | 0 refills | Status: AC

## 2024-10-27 MED ORDER — TADALAFIL 5 MG PO TABS: 5.0000 mg | ORAL_TABLET | Freq: Every day | ORAL | 3 refills | Status: AC

## 2024-10-27 NOTE — Assessment & Plan Note (Addendum)
 Probable prostatitis.  Discussed.  Start Cipro  po qd

## 2024-10-27 NOTE — Progress Notes (Signed)
 Subjective:  Patient ID: Elijah Ashley, male    DOB: 04-26-1966  Age: 58 y.o. MRN: [id number]  CC: Medical Management of Chronic Issues (Kidney function. Left sided flank pain x 3-4 weeks (dull))   HPI [name] presents for L lower flank pain, sometimes pain irradiates to the L testis - 2 months, dribbling and weak stream sometimes  Outpatient Medications Prior to Visit  Medication Sig Dispense Refill   albuterol  (VENTOLIN  HFA) 108 (90 Base) MCG/ACT inhaler TAKE 2 PUFFS BY MOUTH EVERY 6 HOURS AS NEEDED FOR WHEEZE OR SHORTNESS OF BREATH 18 each 5   ASHWAGANDHA PO Take by mouth. With Maca Root, Ginseng Root, and Gingko Biloba     B Complex Vitamins (B COMPLEX-B12 PO) Take by mouth.     Bacillus Coagulans-Inulin (BENEFIBER PREBIOTIC+PROBIOTIC) CHEW Chew by mouth.     busPIRone  (BUSPAR ) 15 MG tablet Take 1-1.5 tabs twice daily 270 tablet 1   Cholecalciferol (VITAMIN D3) 2000 units capsule Take 1 capsule (2,000 Units total) by mouth daily. 100 capsule 3   clindamycin -benzoyl peroxide (BENZACLIN) gel Apply topically 2 (two) times daily. 50 g 3   clonazePAM  (KLONOPIN ) 1 MG tablet Take 1 tablet (1 mg total) by mouth 2 (two) times daily as needed for anxiety. 180 tablet 1   cyclobenzaprine  (FLEXERIL ) 10 MG tablet Take 1 tablet (10 mg total) by mouth at bedtime. 20 tablet 0   cyclobenzaprine  (FLEXERIL ) 5 MG tablet TAKE 1 TABLET BY MOUTH EVERY DAY AS NEEDED 30 tablet 1   dorzolamide-timolol (COSOPT) 2-0.5 % ophthalmic solution SMARTSIG:In Eye(s)     fexofenadine  (ALLEGRA ) 180 MG tablet TAKE 1 TABLET BY MOUTH EVERY DAY 90 tablet 3   FLUoxetine  (PROZAC ) 40 MG capsule Take 1 capsule (40 mg total) by mouth daily. 90 capsule 1   gabapentin  (NEURONTIN ) 100 MG capsule Take 1 capsule (100 mg total) by mouth 3 (three) times daily as needed. 90 capsule 3   latanoprost  (XALATAN ) 0.005 % ophthalmic solution Place 1 drop into both eyes at bedtime. 2.5 mL 1   levothyroxine (SYNTHROID) 150 MCG tablet  Take 150 mcg by mouth every morning. Takes 1.5 tabs on Sundays     meloxicam  (MOBIC ) 15 MG tablet Take 1 tablet (15 mg total) by mouth daily as needed. 30 tablet 1   Moringa Oleifera (MORINGA PO) Take by mouth.     Omega-3 Fatty Acids (OMEGA-3 CF PO) Take by mouth.     pantoprazole  (PROTONIX ) 40 MG tablet TAKE 1 TABLET BY MOUTH EVERY DAY 90 tablet 3   Travoprost , BAK Free, (TRAVATAN  Z) 0.004 % SOLN ophthalmic solution Place 1 drop into both eyes 2 (two) times daily.     zolpidem  (AMBIEN ) 10 MG tablet TAKE 0.5-1 TABLETS (5-10 MG TOTAL) BY MOUTH AT BEDTIME AS NEEDED FOR SLEEP. 30 tablet 2   tadalafil  (CIALIS ) 20 MG tablet Take 1 tablet (20 mg total) by mouth daily as needed for erectile dysfunction. 12 tablet 5   loratadine  (CLARITIN ) 10 MG tablet Take 1 tablet (10 mg total) by mouth daily. (Patient not taking: Reported on 10/27/2024) 100 tablet 3   No facility-administered medications prior to visit.    ROS: Review of Systems  Constitutional:  Negative for appetite change, fatigue, fever and unexpected weight change.  HENT:  Negative for congestion, nosebleeds, sneezing, sore throat and trouble swallowing.   Eyes:  Negative for itching and visual disturbance.  Respiratory:  Negative for cough.   Cardiovascular:  Negative for chest pain, palpitations and leg swelling.  Gastrointestinal:  Negative for abdominal distention, blood in stool, diarrhea and nausea.  Genitourinary:  Negative for frequency and hematuria.  Musculoskeletal:  Positive for back pain. Negative for gait problem, joint swelling and neck pain.  Skin:  Negative for rash.  Neurological:  Negative for dizziness, tremors, speech difficulty and weakness.  Psychiatric/Behavioral:  Negative for agitation, dysphoric mood and sleep disturbance. The patient is not nervous/anxious.     Objective:  BP 116/84   Pulse 70   Temp 98 F (36.7 C)   Ht 6' 4 (1.93 m)   Wt 252 lb 3.2 oz (114.4 kg)   SpO2 98%   BMI 30.70 kg/m   BP  Readings from Last 3 Encounters:  10/27/24 116/84  08/05/24 114/82  06/16/24 95/60    Wt Readings from Last 3 Encounters:  10/27/24 252 lb 3.2 oz (114.4 kg)  08/05/24 254 lb (115.2 kg)  06/16/24 252 lb (114.3 kg)    Physical Exam Constitutional:      General: He is not in acute distress.    Appearance: Normal appearance. He is well-developed.     Comments: NAD  Eyes:     Conjunctiva/sclera: Conjunctivae normal.     Pupils: Pupils are equal, round, and reactive to light.  Neck:     Thyroid : No thyromegaly.     Vascular: No JVD.  Cardiovascular:     Rate and Rhythm: Normal rate and regular rhythm.     Heart sounds: Normal heart sounds. No murmur heard.    No friction rub. No gallop.  Pulmonary:     Effort: Pulmonary effort is normal. No respiratory distress.     Breath sounds: Normal breath sounds. No wheezing or rales.  Chest:     Chest wall: No tenderness.  Abdominal:     General: Bowel sounds are normal. There is no distension.     Palpations: Abdomen is soft. There is no mass.     Tenderness: There is no abdominal tenderness. There is no guarding or rebound.  Genitourinary:    Penis: Normal.      Testes: Normal.     Prostate: Normal.     Rectum: Normal. Guaiac result negative.  Musculoskeletal:        General: No tenderness. Normal range of motion.     Cervical back: Normal range of motion.  Lymphadenopathy:     Cervical: No cervical adenopathy.  Skin:    General: Skin is warm and dry.     Findings: No rash.  Neurological:     Mental Status: He is alert and oriented to person, place, and time.     Cranial Nerves: No cranial nerve deficit.     Motor: No abnormal muscle tone.     Coordination: Coordination normal.     Gait: Gait normal.     Deep Tendon Reflexes: Reflexes are normal and symmetric.  Psychiatric:        Behavior: Behavior normal.        Thought Content: Thought content normal.        Judgment: Judgment normal.   L testis is  sensitive Prostate NT  Lab Results  Component Value Date   WBC 6.7 06/16/2024   HGB 13.6 06/16/2024   HCT 42.2 06/16/2024   PLT 153.0 06/16/2024   GLUCOSE 85 06/16/2024   CHOL 212 (H) 06/16/2024   TRIG 173.0 (H) 06/16/2024   HDL 35.40 (L) 06/16/2024   LDLDIRECT 176.7 11/12/2013   LDLCALC 142 (H) 06/16/2024   ALT 19 06/16/2024  AST 18 06/16/2024   NA 138 06/16/2024   K 4.2 06/16/2024   CL 105 06/16/2024   CREATININE 0.99 06/16/2024   BUN 8 06/16/2024   CO2 28 06/16/2024   TSH 0.61 06/16/2024   PSA 0.80 06/16/2024   INR 1.0 06/14/2009   HGBA1C 5.2 05/04/2020    DG ESOPHAGUS W DOUBLE CM (HD) Result Date: 08/14/2023 CLINICAL DATA:  Dysphagia.  History of thyroid  surgery. EXAM: ESOPHOGRAM/BARIUM SWALLOW TECHNIQUE: Combined double contrast and single contrast examination performed using effervescent crystals, thick barium liquid, and thin barium liquid. FLUOROSCOPY: Radiation Exposure Index (as provided by the fluoroscopic device): 27.2 mGy Kerma COMPARISON:  None Available. FINDINGS: Scout: Visualized bilateral lungs are clear. Bilateral lateral costophrenic angles are clear. No free air under the domes of diaphragm. Double contrast views of the esophageal mucosa are normal-appearing. Esophageal motility is normal. There is no stricture, ring or web. No hiatal hernia. No spontaneous gastro-esophageal reflux and no reflux with the provocative water siphon test, coughing or Valsalva maneuver. IMPRESSION: *Normal esophagram. Electronically Signed   By: Ree Molt M.D.   On: 08/14/2023 10:59    Assessment & Plan:   Problem List Items Addressed This Visit     Bladder neck obstruction - Primary   Mild Start Cialis  5 mg/d      Left flank pain   New.  Mild.  Obtain renal ultrasound      Relevant Orders   US  RENAL (Completed)   Prostatitis   Probable prostatitis.  Discussed.  Start Cipro  po qd         Meds ordered this encounter  Medications   ciprofloxacin  (CIPRO )  500 MG tablet    Sig: Take 1 tablet (500 mg total) by mouth 2 (two) times daily.    Dispense:  28 tablet    Refill:  0   tadalafil  (CIALIS ) 5 MG tablet    Sig: Take 1 tablet (5 mg total) by mouth daily.    Dispense:  90 tablet    Refill:  3      Follow-up: Return in about 2 months (around 12/27/2024) for a follow-up visit.  Marolyn Noel, MD

## 2024-10-27 NOTE — Assessment & Plan Note (Addendum)
 Mild Start Cialis  5 mg/d

## 2024-10-29 ENCOUNTER — Ambulatory Visit
Admission: RE | Admit: 2024-10-29 | Discharge: 2024-10-29 | Disposition: A | Discharge status: Home / Self Care | Source: Ambulatory Visit | Attending: Internal Medicine | Admitting: Internal Medicine

## 2024-10-29 DIAGNOSIS — R10A2 Flank pain, left side: Secondary | ICD-10-CM

## 2024-10-30 ENCOUNTER — Ambulatory Visit: Payer: Self-pay | Admitting: Internal Medicine

## 2024-10-31 DIAGNOSIS — R10A2 Flank pain, left side: Secondary | ICD-10-CM | POA: Insufficient documentation

## 2024-10-31 NOTE — Assessment & Plan Note (Signed)
 New.  Mild.  Obtain renal ultrasound

## 2024-12-11 ENCOUNTER — Encounter: Payer: Self-pay | Admitting: Internal Medicine

## 2024-12-23 ENCOUNTER — Other Ambulatory Visit: Payer: Self-pay | Admitting: Internal Medicine

## 2024-12-23 MED ORDER — PREGABALIN 50 MG PO CAPS: 50.0000 mg | ORAL_CAPSULE | Freq: Two times a day (BID) | ORAL | 3 refills | Status: AC | PRN

## 2025-01-02 ENCOUNTER — Other Ambulatory Visit: Payer: Self-pay | Admitting: Internal Medicine

## 2025-01-11 ENCOUNTER — Ambulatory Visit: Admitting: Internal Medicine

## 2025-01-20 ENCOUNTER — Other Ambulatory Visit: Payer: Self-pay | Admitting: Internal Medicine
